# Patient Record
Sex: Female | Born: 1950 | Race: Black or African American | Hispanic: No | Marital: Married | Smoking: Former smoker
Health system: Southern US, Community
[De-identification: ages and names within clinical notes are randomized; demographics above are authoritative.]

## PROBLEM LIST (undated history)

## (undated) DIAGNOSIS — R609 Edema, unspecified: Secondary | ICD-10-CM

## (undated) DIAGNOSIS — M1990 Unspecified osteoarthritis, unspecified site: Secondary | ICD-10-CM

## (undated) DIAGNOSIS — M75101 Unspecified rotator cuff tear or rupture of right shoulder, not specified as traumatic: Secondary | ICD-10-CM

## (undated) DIAGNOSIS — J40 Bronchitis, not specified as acute or chronic: Secondary | ICD-10-CM

## (undated) DIAGNOSIS — E079 Disorder of thyroid, unspecified: Secondary | ICD-10-CM

## (undated) DIAGNOSIS — G4700 Insomnia, unspecified: Secondary | ICD-10-CM

## (undated) HISTORY — PX: BUNIONECTOMY: SHX129

## (undated) HISTORY — PX: COLONOSCOPY: SHX174

## (undated) HISTORY — DX: Bronchitis, not specified as acute or chronic: J40

## (undated) HISTORY — DX: Edema, unspecified: R60.9

## (undated) HISTORY — DX: Unspecified osteoarthritis, unspecified site: M19.90

## (undated) HISTORY — PX: CHOLECYSTECTOMY: SHX55

## (undated) HISTORY — PX: OTHER SURGICAL HISTORY: SHX169

## (undated) HISTORY — PX: ABDOMINAL HYSTERECTOMY: SHX81

## (undated) HISTORY — DX: Unspecified rotator cuff tear or rupture of right shoulder, not specified as traumatic: M75.101

## (undated) HISTORY — DX: Insomnia, unspecified: G47.00

---

## 1999-05-05 ENCOUNTER — Encounter: Payer: Self-pay | Admitting: Internal Medicine

## 1999-05-05 ENCOUNTER — Encounter: Admission: RE | Admit: 1999-05-05 | Discharge: 1999-05-05 | Payer: Self-pay | Admitting: Internal Medicine

## 2000-05-27 ENCOUNTER — Encounter: Admission: RE | Admit: 2000-05-27 | Discharge: 2000-05-27 | Payer: Self-pay | Admitting: Occupational Medicine

## 2000-05-27 ENCOUNTER — Encounter: Payer: Self-pay | Admitting: Occupational Medicine

## 2000-12-10 ENCOUNTER — Observation Stay (HOSPITAL_COMMUNITY): Admission: EM | Admit: 2000-12-10 | Discharge: 2000-12-11 | Payer: Self-pay | Admitting: Internal Medicine

## 2000-12-10 ENCOUNTER — Encounter: Payer: Self-pay | Admitting: Internal Medicine

## 2000-12-11 ENCOUNTER — Encounter: Payer: Self-pay | Admitting: Internal Medicine

## 2001-05-24 ENCOUNTER — Observation Stay (HOSPITAL_COMMUNITY): Admission: RE | Admit: 2001-05-24 | Discharge: 2001-05-25 | Payer: Self-pay | Admitting: General Surgery

## 2001-10-13 ENCOUNTER — Encounter: Payer: Self-pay | Admitting: Internal Medicine

## 2001-10-13 ENCOUNTER — Encounter: Admission: RE | Admit: 2001-10-13 | Discharge: 2001-10-13 | Payer: Self-pay | Admitting: Internal Medicine

## 2004-11-18 ENCOUNTER — Encounter: Admission: RE | Admit: 2004-11-18 | Discharge: 2004-11-18 | Payer: Self-pay | Admitting: Occupational Medicine

## 2004-12-24 ENCOUNTER — Ambulatory Visit: Payer: Self-pay | Admitting: Internal Medicine

## 2005-01-08 ENCOUNTER — Ambulatory Visit: Payer: Self-pay | Admitting: Internal Medicine

## 2005-10-21 ENCOUNTER — Encounter: Admission: RE | Admit: 2005-10-21 | Discharge: 2005-10-21 | Payer: Self-pay | Admitting: Orthopedic Surgery

## 2006-01-13 ENCOUNTER — Encounter: Admission: RE | Admit: 2006-01-13 | Discharge: 2006-01-13 | Payer: Self-pay | Admitting: Occupational Medicine

## 2009-04-11 ENCOUNTER — Encounter: Admission: RE | Admit: 2009-04-11 | Discharge: 2009-04-11 | Payer: Self-pay | Admitting: Internal Medicine

## 2010-03-04 ENCOUNTER — Ambulatory Visit: Payer: Self-pay | Admitting: Hematology & Oncology

## 2010-03-26 LAB — CBC WITH DIFFERENTIAL (CANCER CENTER ONLY)
BASO#: 0 10*3/uL (ref 0.0–0.2)
BASO%: 0.5 % (ref 0.0–2.0)
HCT: 40.4 % (ref 34.8–46.6)
LYMPH#: 2.6 10*3/uL (ref 0.9–3.3)
MONO#: 0.4 10*3/uL (ref 0.1–0.9)
NEUT#: 4.3 10*3/uL (ref 1.5–6.5)
Platelets: 525 10*3/uL — ABNORMAL HIGH (ref 145–400)
RDW: 11.5 % (ref 10.5–14.6)

## 2010-03-26 LAB — RETICULOCYTES (CHCC)
ABS Retic: 37 10*3/uL (ref 19.0–186.0)
RBC.: 4.62 MIL/uL (ref 3.87–5.11)
Retic Ct Pct: 0.8 % (ref 0.4–3.1)

## 2010-03-26 LAB — CHCC SATELLITE - SMEAR

## 2010-03-26 LAB — TECHNOLOGIST REVIEW CHCC SATELLITE

## 2010-04-02 LAB — JAK2 GENOTYPR: JAK2 GenotypR: NOT DETECTED

## 2010-04-25 ENCOUNTER — Encounter: Admission: RE | Admit: 2010-04-25 | Discharge: 2010-04-25 | Payer: Self-pay | Admitting: Internal Medicine

## 2010-07-04 ENCOUNTER — Ambulatory Visit: Payer: Self-pay | Admitting: Hematology & Oncology

## 2014-04-05 DIAGNOSIS — I10 Essential (primary) hypertension: Secondary | ICD-10-CM | POA: Diagnosis not present

## 2014-04-05 DIAGNOSIS — Z0000 Encounter for general adult medical examination without abnormal findings: Secondary | ICD-10-CM | POA: Diagnosis not present

## 2014-04-05 DIAGNOSIS — E782 Mixed hyperlipidemia: Secondary | ICD-10-CM | POA: Diagnosis not present

## 2014-04-11 ENCOUNTER — Emergency Department (HOSPITAL_COMMUNITY)
Admission: EM | Admit: 2014-04-11 | Discharge: 2014-04-11 | Disposition: A | Discharge status: Home / Self Care | Payer: Medicare Other | Attending: Emergency Medicine | Admitting: Emergency Medicine

## 2014-04-11 ENCOUNTER — Encounter (HOSPITAL_COMMUNITY): Payer: Self-pay | Admitting: Emergency Medicine

## 2014-04-11 DIAGNOSIS — E079 Disorder of thyroid, unspecified: Secondary | ICD-10-CM | POA: Insufficient documentation

## 2014-04-11 DIAGNOSIS — R7989 Other specified abnormal findings of blood chemistry: Secondary | ICD-10-CM | POA: Insufficient documentation

## 2014-04-11 DIAGNOSIS — R899 Unspecified abnormal finding in specimens from other organs, systems and tissues: Secondary | ICD-10-CM

## 2014-04-11 DIAGNOSIS — Z7982 Long term (current) use of aspirin: Secondary | ICD-10-CM | POA: Insufficient documentation

## 2014-04-11 DIAGNOSIS — R0602 Shortness of breath: Secondary | ICD-10-CM | POA: Diagnosis not present

## 2014-04-11 DIAGNOSIS — Z79899 Other long term (current) drug therapy: Secondary | ICD-10-CM | POA: Diagnosis not present

## 2014-04-11 DIAGNOSIS — R799 Abnormal finding of blood chemistry, unspecified: Secondary | ICD-10-CM | POA: Diagnosis not present

## 2014-04-11 HISTORY — DX: Disorder of thyroid, unspecified: E07.9

## 2014-04-11 LAB — BASIC METABOLIC PANEL
Anion gap: 10 (ref 5–15)
BUN: 7 mg/dL (ref 6–23)
CALCIUM: 9.8 mg/dL (ref 8.4–10.5)
CHLORIDE: 101 meq/L (ref 96–112)
CO2: 30 mEq/L (ref 19–32)
Creatinine, Ser: 0.87 mg/dL (ref 0.50–1.10)
GFR calc non Af Amer: 68 mL/min — ABNORMAL LOW (ref >90)
GFR, EST AFRICAN AMERICAN: 79 mL/min — AB (ref >90)
GLUCOSE: 88 mg/dL (ref 70–99)
POTASSIUM: 3.8 meq/L (ref 3.7–5.3)
SODIUM: 141 meq/L (ref 137–147)

## 2014-04-11 LAB — I-STAT TROPONIN, ED: Troponin i, poc: 0 ng/mL (ref 0.00–0.08)

## 2014-04-11 LAB — I-STAT CHEM 8, ED
BUN: 5 mg/dL — AB (ref 6–23)
CALCIUM ION: 1.31 mmol/L — AB (ref 1.13–1.30)
CREATININE: 0.9 mg/dL (ref 0.50–1.10)
Chloride: 99 mEq/L (ref 96–112)
GLUCOSE: 92 mg/dL (ref 70–99)
HCT: 41 % (ref 36.0–46.0)
HEMOGLOBIN: 13.9 g/dL (ref 12.0–15.0)
Potassium: 3.7 mEq/L (ref 3.7–5.3)
Sodium: 140 mEq/L (ref 137–147)
TCO2: 30 mmol/L (ref 0–100)

## 2014-04-11 NOTE — ED Notes (Signed)
Pt sent here by her doctor for abnormal labs. High K. 7.7. Pt denies any symptoms and has no complaints.

## 2014-04-11 NOTE — ED Provider Notes (Signed)
Medical screening examination/treatment/procedure(s) were performed by non-physician practitioner and as supervising physician I was immediately available for consultation/collaboration.   EKG Interpretation   Date/Time:  Wednesday April 11 2014 09:24:39 EDT Ventricular Rate:  70 PR Interval:  170 QRS Duration: 84 QT Interval:  378 QTC Calculation: 408 R Axis:   73 Text Interpretation:  Normal sinus rhythm Normal ECG No previous ECGs  available Confirmed by Christy Gentles  MD, Luke Rigsbee ([id number]) on 04/11/2014 9:26:45  AM        Sharyon Cable, MD 04/11/14 1353

## 2014-04-11 NOTE — Discharge Instructions (Signed)
Hyperkalemia Hyperkalemia is when you have too much potassium in your blood. This can be a life-threatening condition. Potassium is normally removed (excreted) from the body by the kidneys. CAUSES  The potassium level in your body can become too high for the following reasons:  You take in too much potassium. You can do this by:  Using salt substitutes. They contain large amounts of potassium.  Taking potassium supplements from your caregiver. The dose may be too high for you.  Eating foods or taking nutritional products with potassium.  You excrete too little potassium. This can happen if:  Your kidneys are not functioning properly. Kidney (renal) disease is a very common cause of hyperkalemia.  You are taking medicines that lower your excretion of potassium, such as certain diuretic medicines.  You have an adrenal gland disease called Addison's disease.  You have a urinary tract obstruction, such as kidney stones.  You are on treatment to mechanically clean your blood (dialysis) and you skip a treatment.  You release a high amount of potassium from your cells into your blood. You may have a condition that causes potassium to move from your cells to your bloodstream. This can happen with:  Injury to muscles or other tissues. Most potassium is stored in the muscles.  Severe burns or infections.  Acidic blood plasma (acidosis). Acidosis can result from many diseases, such as uncontrolled diabetes. SYMPTOMS  Usually, there are no symptoms unless the potassium is dangerously high or has risen very quickly. Symptoms may include:  Irregular or very slow heartbeat.  Feeling sick to your stomach (nauseous).  Tiredness (fatigue).  Nerve problems such as tingling of the skin, numbness of the hands or feet, weakness, or paralysis. DIAGNOSIS  A simple blood test can measure the amount of potassium in your body. An electrocardiogram test of the heart can also help make the diagnosis.  The heart may beat dangerously fast or slow down and stop beating with severe hyperkalemia.  TREATMENT  Treatment depends on how bad the condition is and on the underlying cause.  If the hyperkalemia is an emergency (causing heart problems or paralysis), many different medicines can be used alone or together to lower the potassium level briefly. This may include an insulin injection even if you are not diabetic. Emergency dialysis may be needed to remove potassium from the body.  If the hyperkalemia is less severe or dangerous, the underlying cause is treated. This can include taking medicines if needed. Your prescription medicines may be changed. You may also need to take a medicine to help your body get rid of potassium. You may need to eat a diet low in potassium. HOME CARE INSTRUCTIONS   Take medicines and supplements as directed by your caregiver.  Do not take any over-the-counter medicines, supplements, natural products, herbs, or vitamins without reviewing them with your caregiver. Certain supplements and natural food products can have high amounts of potassium. Other products (such as ibuprofen) can damage weak kidneys and raise your potassium.  You may be asked to do repeat lab tests. Be sure to follow these directions.  If you have kidney disease, you may need to follow a low potassium diet. SEEK MEDICAL CARE IF:   You notice an irregular or very slow heartbeat.  You feel lightheaded.  You develop weakness that is unusual for you. SEEK IMMEDIATE MEDICAL CARE IF:   You have shortness of breath.  You have chest discomfort.  You pass out (faint). MAKE SURE YOU:   Understand   these instructions.  Will watch your condition.  Will get help right away if you are not doing well or get worse. Document Released: 05/29/2002 Document Revised: 08/31/2011 Document Reviewed: 09/13/2013 [employer] Patient Information 2015 [employer]. This information is not intended to replace  advice given to you by your health care provider. Make sure you discuss any questions you have with your health care provider.  

## 2014-04-11 NOTE — ED Notes (Signed)
Pt reports no complaints. Went to pcp yesterday for checkup and was called today due to lab results of K 7.7. No distress noted at this time.

## 2014-04-11 NOTE — ED Provider Notes (Signed)
CSN: [id number]     Arrival date & time 04/11/14  0905 History   First MD Initiated Contact with Patient 04/11/14 [phone]     Chief Complaint  Patient presents with  . abnormal labs      (Consider location/radiation/quality/duration/timing/severity/associated sxs/prior Treatment) HPI Comments: Patient presents to the emergency department with chief complaints of abnormal lab. She states that she was caught by her primary care physician this morning, and told to come to the emergency department for evaluation of hyperkalemia. She states that her potassium level was 7.7. She had blood drawn approximately one week ago. She denies any associated chest pain. She states that she has had some exertional shortness of breath for the past several months. She states this is mild, and does not interfere with her daily life. She has not taken anything to alleviate her symptoms. She has no cardiac history, or history of PE or DVT.  The history is provided by the patient. No language interpreter was used.    Past Medical History  Diagnosis Date  . Thyroid disease    Past Surgical History  Procedure Laterality Date  . Abdominal hysterectomy    . Cesarean section     History reviewed. No pertinent family history. History  Substance Use Topics  . Smoking status: Never Smoker   . Smokeless tobacco: Not on file  . Alcohol Use: Yes     Comment: occ   OB History   Grav Para Term Preterm Abortions TAB SAB Ect Mult Living                 Review of Systems  Constitutional: Negative for fever and chills.  Respiratory: Positive for shortness of breath.   Cardiovascular: Negative for chest pain.  Gastrointestinal: Negative for nausea, vomiting, diarrhea and constipation.  Genitourinary: Negative for dysuria.  All other systems reviewed and are negative.     Allergies  Review of patient's allergies indicates no known allergies.  Home Medications   Prior to Admission medications   Medication  Sig Start Date End Date Taking? Authorizing Provider  aspirin EC 81 MG tablet Take 81 mg by mouth daily.   Yes Historical Provider, MD  estrogens, conjugated, (PREMARIN) 1.25 MG tablet Take 1.25 mg by mouth daily.   Yes Historical Provider, MD  levothyroxine (SYNTHROID, LEVOTHROID) 100 MCG tablet Take 100 mcg by mouth daily before breakfast.   Yes Historical Provider, MD  Multiple Vitamins-Minerals (MULTIVITAMIN WITH MINERALS) tablet Take 1 tablet by mouth daily.   Yes Historical Provider, MD  zolpidem (AMBIEN) 10 MG tablet Take 10 mg by mouth at bedtime.   Yes Historical Provider, MD   BP 146/67  Pulse 72  Temp(Src) 97.8 F (36.6 C) (Oral)  Resp 20  SpO2 100% Physical Exam  Nursing note and vitals reviewed. Constitutional: She is oriented to person, place, and time. She appears well-developed and well-nourished.  HENT:  Head: Normocephalic and atraumatic.  Eyes: Conjunctivae and EOM are normal. Pupils are equal, round, and reactive to light.  Neck: Normal range of motion. Neck supple.  Cardiovascular: Normal rate and regular rhythm.  Exam reveals no gallop and no friction rub.   No murmur heard. Pulmonary/Chest: Effort normal and breath sounds normal. No respiratory distress. She has no wheezes. She has no rales. She exhibits no tenderness.  Clear to auscultation bilaterally  Abdominal: Soft. She exhibits no distension and no mass. There is no tenderness. There is no rebound and no guarding.  Musculoskeletal: Normal range of motion. She  exhibits no edema and no tenderness.  Neurological: She is alert and oriented to person, place, and time.  Skin: Skin is warm and dry.  Psychiatric: She has a normal mood and affect. Her behavior is normal. Judgment and thought content normal.    ED Course  Procedures (including critical care time) Results for orders placed during the hospital encounter of 40/34/74  BASIC METABOLIC PANEL      Result Value Ref Range   Sodium 141  137 - 147 mEq/L    Potassium 3.8  3.7 - 5.3 mEq/L   Chloride 101  96 - 112 mEq/L   CO2 30  19 - 32 mEq/L   Glucose, Bld 88  70 - 99 mg/dL   BUN 7  6 - 23 mg/dL   Creatinine, Ser 0.87  0.50 - 1.10 mg/dL   Calcium 9.8  8.4 - 10.5 mg/dL   GFR calc non Af Amer 68 (*) >90 mL/min   GFR calc Af Amer 79 (*) >90 mL/min   Anion gap 10  5 - 15  I-STAT CHEM 8, ED      Result Value Ref Range   Sodium 140  137 - 147 mEq/L   Potassium 3.7  3.7 - 5.3 mEq/L   Chloride 99  96 - 112 mEq/L   BUN 5 (*) 6 - 23 mg/dL   Creatinine, Ser 0.90  0.50 - 1.10 mg/dL   Glucose, Bld 92  70 - 99 mg/dL   Calcium, Ion 1.31 (*) 1.13 - 1.30 mmol/L   TCO2 30  0 - 100 mmol/L   Hemoglobin 13.9  12.0 - 15.0 g/dL   HCT 41.0  36.0 - 46.0 %  I-STAT TROPOININ, ED      Result Value Ref Range   Troponin i, poc 0.00  0.00 - 0.08 ng/mL   Comment 3                EKG Interpretation   Date/Time:  Wednesday April 11 2014 09:24:39 EDT Ventricular Rate:  70 PR Interval:  170 QRS Duration: 84 QT Interval:  378 QTC Calculation: 408 R Axis:   73 Text Interpretation:  Normal sinus rhythm Normal ECG No previous ECGs  available Confirmed by [place] ([id number]) on 04/11/2014 9:26:45  AM      MDM   Final diagnoses:  Abnormal laboratory test result    Patient with reported lab value potassium of 7.7. Suspect hemolysis, given the patient is symptom-free, and her EKG is normal, no peaked T waves. Will check basic labs, and we'll recess.  Today, potassium is 3.7.  Patient feels well.  Recommend PCP follow-up.  No chest pain. Very mild intermittent SOB for months.  No symptoms here.  Doubt PE, no hx of such, no hypoxia.  Normal EKG and troponin, no cardiac history, no chest pain.  Appropriate for follow-up with PCP.  Patient discussed with Dr. Christy Gentles.    Montine Circle, PA-C 04/11/14 1142

## 2014-04-26 DIAGNOSIS — M5442 Lumbago with sciatica, left side: Secondary | ICD-10-CM | POA: Diagnosis not present

## 2014-04-26 DIAGNOSIS — M47816 Spondylosis without myelopathy or radiculopathy, lumbar region: Secondary | ICD-10-CM | POA: Diagnosis not present

## 2014-04-26 DIAGNOSIS — M9903 Segmental and somatic dysfunction of lumbar region: Secondary | ICD-10-CM | POA: Diagnosis not present

## 2014-04-26 DIAGNOSIS — M5136 Other intervertebral disc degeneration, lumbar region: Secondary | ICD-10-CM | POA: Diagnosis not present

## 2014-05-07 DIAGNOSIS — E875 Hyperkalemia: Secondary | ICD-10-CM | POA: Diagnosis not present

## 2014-05-24 DIAGNOSIS — M5136 Other intervertebral disc degeneration, lumbar region: Secondary | ICD-10-CM | POA: Diagnosis not present

## 2014-05-24 DIAGNOSIS — M47816 Spondylosis without myelopathy or radiculopathy, lumbar region: Secondary | ICD-10-CM | POA: Diagnosis not present

## 2014-05-24 DIAGNOSIS — M5442 Lumbago with sciatica, left side: Secondary | ICD-10-CM | POA: Diagnosis not present

## 2014-05-24 DIAGNOSIS — M9903 Segmental and somatic dysfunction of lumbar region: Secondary | ICD-10-CM | POA: Diagnosis not present

## 2014-06-27 DIAGNOSIS — M47816 Spondylosis without myelopathy or radiculopathy, lumbar region: Secondary | ICD-10-CM | POA: Diagnosis not present

## 2014-06-27 DIAGNOSIS — M5136 Other intervertebral disc degeneration, lumbar region: Secondary | ICD-10-CM | POA: Diagnosis not present

## 2014-06-27 DIAGNOSIS — M5442 Lumbago with sciatica, left side: Secondary | ICD-10-CM | POA: Diagnosis not present

## 2014-06-27 DIAGNOSIS — M9903 Segmental and somatic dysfunction of lumbar region: Secondary | ICD-10-CM | POA: Diagnosis not present

## 2014-07-12 DIAGNOSIS — E559 Vitamin D deficiency, unspecified: Secondary | ICD-10-CM | POA: Diagnosis not present

## 2014-07-12 DIAGNOSIS — G4700 Insomnia, unspecified: Secondary | ICD-10-CM | POA: Diagnosis not present

## 2014-07-12 DIAGNOSIS — E039 Hypothyroidism, unspecified: Secondary | ICD-10-CM | POA: Diagnosis not present

## 2014-07-12 DIAGNOSIS — Z23 Encounter for immunization: Secondary | ICD-10-CM | POA: Diagnosis not present

## 2014-07-12 DIAGNOSIS — N951 Menopausal and female climacteric states: Secondary | ICD-10-CM | POA: Diagnosis not present

## 2014-07-26 DIAGNOSIS — M5442 Lumbago with sciatica, left side: Secondary | ICD-10-CM | POA: Diagnosis not present

## 2014-07-26 DIAGNOSIS — M5136 Other intervertebral disc degeneration, lumbar region: Secondary | ICD-10-CM | POA: Diagnosis not present

## 2014-07-26 DIAGNOSIS — M47816 Spondylosis without myelopathy or radiculopathy, lumbar region: Secondary | ICD-10-CM | POA: Diagnosis not present

## 2014-07-26 DIAGNOSIS — M9903 Segmental and somatic dysfunction of lumbar region: Secondary | ICD-10-CM | POA: Diagnosis not present

## 2014-08-30 DIAGNOSIS — M9903 Segmental and somatic dysfunction of lumbar region: Secondary | ICD-10-CM | POA: Diagnosis not present

## 2014-08-30 DIAGNOSIS — M5136 Other intervertebral disc degeneration, lumbar region: Secondary | ICD-10-CM | POA: Diagnosis not present

## 2014-08-30 DIAGNOSIS — M47816 Spondylosis without myelopathy or radiculopathy, lumbar region: Secondary | ICD-10-CM | POA: Diagnosis not present

## 2014-08-30 DIAGNOSIS — M5442 Lumbago with sciatica, left side: Secondary | ICD-10-CM | POA: Diagnosis not present

## 2014-09-24 DIAGNOSIS — M5442 Lumbago with sciatica, left side: Secondary | ICD-10-CM | POA: Diagnosis not present

## 2014-09-24 DIAGNOSIS — M5136 Other intervertebral disc degeneration, lumbar region: Secondary | ICD-10-CM | POA: Diagnosis not present

## 2014-09-24 DIAGNOSIS — M47816 Spondylosis without myelopathy or radiculopathy, lumbar region: Secondary | ICD-10-CM | POA: Diagnosis not present

## 2014-09-24 DIAGNOSIS — M9903 Segmental and somatic dysfunction of lumbar region: Secondary | ICD-10-CM | POA: Diagnosis not present

## 2014-10-29 DIAGNOSIS — M9903 Segmental and somatic dysfunction of lumbar region: Secondary | ICD-10-CM | POA: Diagnosis not present

## 2014-10-29 DIAGNOSIS — M5136 Other intervertebral disc degeneration, lumbar region: Secondary | ICD-10-CM | POA: Diagnosis not present

## 2014-10-29 DIAGNOSIS — M5442 Lumbago with sciatica, left side: Secondary | ICD-10-CM | POA: Diagnosis not present

## 2014-10-29 DIAGNOSIS — M47816 Spondylosis without myelopathy or radiculopathy, lumbar region: Secondary | ICD-10-CM | POA: Diagnosis not present

## 2014-10-30 DIAGNOSIS — M9903 Segmental and somatic dysfunction of lumbar region: Secondary | ICD-10-CM | POA: Diagnosis not present

## 2014-10-30 DIAGNOSIS — M5442 Lumbago with sciatica, left side: Secondary | ICD-10-CM | POA: Diagnosis not present

## 2014-10-30 DIAGNOSIS — M47816 Spondylosis without myelopathy or radiculopathy, lumbar region: Secondary | ICD-10-CM | POA: Diagnosis not present

## 2014-10-30 DIAGNOSIS — M5136 Other intervertebral disc degeneration, lumbar region: Secondary | ICD-10-CM | POA: Diagnosis not present

## 2014-11-01 DIAGNOSIS — M5136 Other intervertebral disc degeneration, lumbar region: Secondary | ICD-10-CM | POA: Diagnosis not present

## 2014-11-01 DIAGNOSIS — M9903 Segmental and somatic dysfunction of lumbar region: Secondary | ICD-10-CM | POA: Diagnosis not present

## 2014-11-01 DIAGNOSIS — M47816 Spondylosis without myelopathy or radiculopathy, lumbar region: Secondary | ICD-10-CM | POA: Diagnosis not present

## 2014-11-01 DIAGNOSIS — M5442 Lumbago with sciatica, left side: Secondary | ICD-10-CM | POA: Diagnosis not present

## 2014-11-02 DIAGNOSIS — E039 Hypothyroidism, unspecified: Secondary | ICD-10-CM | POA: Diagnosis not present

## 2014-11-02 DIAGNOSIS — Z79899 Other long term (current) drug therapy: Secondary | ICD-10-CM | POA: Diagnosis not present

## 2014-11-03 DIAGNOSIS — M25562 Pain in left knee: Secondary | ICD-10-CM | POA: Diagnosis not present

## 2014-11-03 DIAGNOSIS — G4700 Insomnia, unspecified: Secondary | ICD-10-CM | POA: Diagnosis not present

## 2014-11-03 DIAGNOSIS — J301 Allergic rhinitis due to pollen: Secondary | ICD-10-CM | POA: Diagnosis not present

## 2014-11-03 DIAGNOSIS — E039 Hypothyroidism, unspecified: Secondary | ICD-10-CM | POA: Diagnosis not present

## 2014-11-05 DIAGNOSIS — M47816 Spondylosis without myelopathy or radiculopathy, lumbar region: Secondary | ICD-10-CM | POA: Diagnosis not present

## 2014-11-05 DIAGNOSIS — M9903 Segmental and somatic dysfunction of lumbar region: Secondary | ICD-10-CM | POA: Diagnosis not present

## 2014-11-05 DIAGNOSIS — M5136 Other intervertebral disc degeneration, lumbar region: Secondary | ICD-10-CM | POA: Diagnosis not present

## 2014-11-05 DIAGNOSIS — M5442 Lumbago with sciatica, left side: Secondary | ICD-10-CM | POA: Diagnosis not present

## 2014-11-08 DIAGNOSIS — M5442 Lumbago with sciatica, left side: Secondary | ICD-10-CM | POA: Diagnosis not present

## 2014-11-08 DIAGNOSIS — M9903 Segmental and somatic dysfunction of lumbar region: Secondary | ICD-10-CM | POA: Diagnosis not present

## 2014-11-08 DIAGNOSIS — M5136 Other intervertebral disc degeneration, lumbar region: Secondary | ICD-10-CM | POA: Diagnosis not present

## 2014-11-08 DIAGNOSIS — M47816 Spondylosis without myelopathy or radiculopathy, lumbar region: Secondary | ICD-10-CM | POA: Diagnosis not present

## 2014-11-24 ENCOUNTER — Emergency Department (HOSPITAL_COMMUNITY)
Admission: EM | Admit: 2014-11-24 | Discharge: 2014-11-24 | Disposition: A | Discharge status: Home / Self Care | Payer: No Typology Code available for payment source | Attending: Emergency Medicine | Admitting: Emergency Medicine

## 2014-11-24 ENCOUNTER — Encounter (HOSPITAL_COMMUNITY): Payer: Self-pay | Admitting: *Deleted

## 2014-11-24 DIAGNOSIS — Y9389 Activity, other specified: Secondary | ICD-10-CM | POA: Insufficient documentation

## 2014-11-24 DIAGNOSIS — Y998 Other external cause status: Secondary | ICD-10-CM | POA: Insufficient documentation

## 2014-11-24 DIAGNOSIS — Y92410 Unspecified street and highway as the place of occurrence of the external cause: Secondary | ICD-10-CM | POA: Diagnosis not present

## 2014-11-24 DIAGNOSIS — E079 Disorder of thyroid, unspecified: Secondary | ICD-10-CM | POA: Diagnosis not present

## 2014-11-24 DIAGNOSIS — S199XXA Unspecified injury of neck, initial encounter: Secondary | ICD-10-CM | POA: Diagnosis present

## 2014-11-24 DIAGNOSIS — Z79899 Other long term (current) drug therapy: Secondary | ICD-10-CM | POA: Diagnosis not present

## 2014-11-24 DIAGNOSIS — S161XXA Strain of muscle, fascia and tendon at neck level, initial encounter: Secondary | ICD-10-CM | POA: Diagnosis not present

## 2014-11-24 DIAGNOSIS — Z7982 Long term (current) use of aspirin: Secondary | ICD-10-CM | POA: Insufficient documentation

## 2014-11-24 MED ORDER — METHOCARBAMOL 500 MG PO TABS: 500.0000 mg | ORAL_TABLET | Freq: Four times a day (QID) | ORAL | Status: DC

## 2014-11-24 NOTE — ED Notes (Signed)
Declined W/C at D/C and was escorted to lobby by RN. 
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
## 2014-11-29 DIAGNOSIS — M62838 Other muscle spasm: Secondary | ICD-10-CM | POA: Diagnosis not present

## 2014-11-29 DIAGNOSIS — M62830 Muscle spasm of back: Secondary | ICD-10-CM | POA: Diagnosis not present

## 2014-12-28 DIAGNOSIS — E039 Hypothyroidism, unspecified: Secondary | ICD-10-CM | POA: Diagnosis not present

## 2015-01-07 DIAGNOSIS — I10 Essential (primary) hypertension: Secondary | ICD-10-CM | POA: Diagnosis not present

## 2015-01-07 DIAGNOSIS — M542 Cervicalgia: Secondary | ICD-10-CM | POA: Diagnosis not present

## 2015-01-25 DIAGNOSIS — S161XXA Strain of muscle, fascia and tendon at neck level, initial encounter: Secondary | ICD-10-CM | POA: Diagnosis not present

## 2015-01-25 DIAGNOSIS — M542 Cervicalgia: Secondary | ICD-10-CM | POA: Diagnosis not present

## 2015-04-30 DIAGNOSIS — Z0000 Encounter for general adult medical examination without abnormal findings: Secondary | ICD-10-CM | POA: Diagnosis not present

## 2015-04-30 DIAGNOSIS — E039 Hypothyroidism, unspecified: Secondary | ICD-10-CM | POA: Diagnosis not present

## 2015-04-30 DIAGNOSIS — M542 Cervicalgia: Secondary | ICD-10-CM | POA: Diagnosis not present

## 2015-04-30 DIAGNOSIS — I10 Essential (primary) hypertension: Secondary | ICD-10-CM | POA: Diagnosis not present

## 2015-04-30 DIAGNOSIS — Z79899 Other long term (current) drug therapy: Secondary | ICD-10-CM | POA: Diagnosis not present

## 2015-05-01 DIAGNOSIS — E039 Hypothyroidism, unspecified: Secondary | ICD-10-CM | POA: Diagnosis not present

## 2015-05-01 DIAGNOSIS — E559 Vitamin D deficiency, unspecified: Secondary | ICD-10-CM | POA: Diagnosis not present

## 2015-05-01 DIAGNOSIS — R7309 Other abnormal glucose: Secondary | ICD-10-CM | POA: Diagnosis not present

## 2015-05-01 DIAGNOSIS — Z0000 Encounter for general adult medical examination without abnormal findings: Secondary | ICD-10-CM | POA: Diagnosis not present

## 2015-10-08 DIAGNOSIS — M546 Pain in thoracic spine: Secondary | ICD-10-CM | POA: Diagnosis not present

## 2015-10-08 DIAGNOSIS — M9901 Segmental and somatic dysfunction of cervical region: Secondary | ICD-10-CM | POA: Diagnosis not present

## 2015-10-08 DIAGNOSIS — M542 Cervicalgia: Secondary | ICD-10-CM | POA: Diagnosis not present

## 2015-10-08 DIAGNOSIS — M5030 Other cervical disc degeneration, unspecified cervical region: Secondary | ICD-10-CM | POA: Diagnosis not present

## 2015-10-15 DIAGNOSIS — M546 Pain in thoracic spine: Secondary | ICD-10-CM | POA: Diagnosis not present

## 2015-10-15 DIAGNOSIS — M5030 Other cervical disc degeneration, unspecified cervical region: Secondary | ICD-10-CM | POA: Diagnosis not present

## 2015-10-15 DIAGNOSIS — M542 Cervicalgia: Secondary | ICD-10-CM | POA: Diagnosis not present

## 2015-10-15 DIAGNOSIS — M9901 Segmental and somatic dysfunction of cervical region: Secondary | ICD-10-CM | POA: Diagnosis not present

## 2015-10-22 DIAGNOSIS — M542 Cervicalgia: Secondary | ICD-10-CM | POA: Diagnosis not present

## 2015-10-22 DIAGNOSIS — M5030 Other cervical disc degeneration, unspecified cervical region: Secondary | ICD-10-CM | POA: Diagnosis not present

## 2015-10-22 DIAGNOSIS — M9901 Segmental and somatic dysfunction of cervical region: Secondary | ICD-10-CM | POA: Diagnosis not present

## 2015-10-22 DIAGNOSIS — M546 Pain in thoracic spine: Secondary | ICD-10-CM | POA: Diagnosis not present

## 2015-11-01 DIAGNOSIS — H40013 Open angle with borderline findings, low risk, bilateral: Secondary | ICD-10-CM | POA: Diagnosis not present

## 2015-11-01 DIAGNOSIS — H2513 Age-related nuclear cataract, bilateral: Secondary | ICD-10-CM | POA: Diagnosis not present

## 2015-11-26 DIAGNOSIS — Z79899 Other long term (current) drug therapy: Secondary | ICD-10-CM | POA: Diagnosis not present

## 2015-11-26 DIAGNOSIS — Z6827 Body mass index (BMI) 27.0-27.9, adult: Secondary | ICD-10-CM | POA: Diagnosis not present

## 2015-11-26 DIAGNOSIS — E039 Hypothyroidism, unspecified: Secondary | ICD-10-CM | POA: Diagnosis not present

## 2015-11-26 DIAGNOSIS — N951 Menopausal and female climacteric states: Secondary | ICD-10-CM | POA: Diagnosis not present

## 2015-11-26 DIAGNOSIS — I10 Essential (primary) hypertension: Secondary | ICD-10-CM | POA: Diagnosis not present

## 2015-11-26 DIAGNOSIS — Z79890 Hormone replacement therapy (postmenopausal): Secondary | ICD-10-CM | POA: Diagnosis not present

## 2015-11-26 DIAGNOSIS — R7309 Other abnormal glucose: Secondary | ICD-10-CM | POA: Diagnosis not present

## 2015-12-03 DIAGNOSIS — M5030 Other cervical disc degeneration, unspecified cervical region: Secondary | ICD-10-CM | POA: Diagnosis not present

## 2015-12-03 DIAGNOSIS — M542 Cervicalgia: Secondary | ICD-10-CM | POA: Diagnosis not present

## 2015-12-03 DIAGNOSIS — M546 Pain in thoracic spine: Secondary | ICD-10-CM | POA: Diagnosis not present

## 2015-12-03 DIAGNOSIS — M9901 Segmental and somatic dysfunction of cervical region: Secondary | ICD-10-CM | POA: Diagnosis not present

## 2016-01-20 DIAGNOSIS — E039 Hypothyroidism, unspecified: Secondary | ICD-10-CM | POA: Diagnosis not present

## 2016-01-21 DIAGNOSIS — M542 Cervicalgia: Secondary | ICD-10-CM | POA: Diagnosis not present

## 2016-01-21 DIAGNOSIS — M5030 Other cervical disc degeneration, unspecified cervical region: Secondary | ICD-10-CM | POA: Diagnosis not present

## 2016-01-21 DIAGNOSIS — M9901 Segmental and somatic dysfunction of cervical region: Secondary | ICD-10-CM | POA: Diagnosis not present

## 2016-01-21 DIAGNOSIS — M546 Pain in thoracic spine: Secondary | ICD-10-CM | POA: Diagnosis not present

## 2016-04-16 DIAGNOSIS — M47814 Spondylosis without myelopathy or radiculopathy, thoracic region: Secondary | ICD-10-CM | POA: Diagnosis not present

## 2016-04-16 DIAGNOSIS — M5134 Other intervertebral disc degeneration, thoracic region: Secondary | ICD-10-CM | POA: Diagnosis not present

## 2016-04-16 DIAGNOSIS — M9902 Segmental and somatic dysfunction of thoracic region: Secondary | ICD-10-CM | POA: Diagnosis not present

## 2016-04-16 DIAGNOSIS — M546 Pain in thoracic spine: Secondary | ICD-10-CM | POA: Diagnosis not present

## 2016-04-30 DIAGNOSIS — E559 Vitamin D deficiency, unspecified: Secondary | ICD-10-CM | POA: Diagnosis not present

## 2016-04-30 DIAGNOSIS — Z0000 Encounter for general adult medical examination without abnormal findings: Secondary | ICD-10-CM | POA: Diagnosis not present

## 2016-04-30 DIAGNOSIS — I10 Essential (primary) hypertension: Secondary | ICD-10-CM | POA: Diagnosis not present

## 2016-04-30 DIAGNOSIS — R7309 Other abnormal glucose: Secondary | ICD-10-CM | POA: Diagnosis not present

## 2016-04-30 DIAGNOSIS — E039 Hypothyroidism, unspecified: Secondary | ICD-10-CM | POA: Diagnosis not present

## 2016-05-19 DIAGNOSIS — M5134 Other intervertebral disc degeneration, thoracic region: Secondary | ICD-10-CM | POA: Diagnosis not present

## 2016-05-19 DIAGNOSIS — M546 Pain in thoracic spine: Secondary | ICD-10-CM | POA: Diagnosis not present

## 2016-05-19 DIAGNOSIS — M9902 Segmental and somatic dysfunction of thoracic region: Secondary | ICD-10-CM | POA: Diagnosis not present

## 2016-05-19 DIAGNOSIS — M47814 Spondylosis without myelopathy or radiculopathy, thoracic region: Secondary | ICD-10-CM | POA: Diagnosis not present

## 2016-05-26 DIAGNOSIS — N951 Menopausal and female climacteric states: Secondary | ICD-10-CM | POA: Diagnosis not present

## 2016-05-26 DIAGNOSIS — I10 Essential (primary) hypertension: Secondary | ICD-10-CM | POA: Diagnosis not present

## 2016-05-26 DIAGNOSIS — H6123 Impacted cerumen, bilateral: Secondary | ICD-10-CM | POA: Diagnosis not present

## 2016-06-09 DIAGNOSIS — M5134 Other intervertebral disc degeneration, thoracic region: Secondary | ICD-10-CM | POA: Diagnosis not present

## 2016-06-09 DIAGNOSIS — M546 Pain in thoracic spine: Secondary | ICD-10-CM | POA: Diagnosis not present

## 2016-06-09 DIAGNOSIS — M9902 Segmental and somatic dysfunction of thoracic region: Secondary | ICD-10-CM | POA: Diagnosis not present

## 2016-06-09 DIAGNOSIS — M47814 Spondylosis without myelopathy or radiculopathy, thoracic region: Secondary | ICD-10-CM | POA: Diagnosis not present

## 2016-06-29 DIAGNOSIS — D473 Essential (hemorrhagic) thrombocythemia: Secondary | ICD-10-CM | POA: Diagnosis not present

## 2016-08-06 DIAGNOSIS — B308 Other viral conjunctivitis: Secondary | ICD-10-CM | POA: Diagnosis not present

## 2016-10-08 DIAGNOSIS — M9902 Segmental and somatic dysfunction of thoracic region: Secondary | ICD-10-CM | POA: Diagnosis not present

## 2016-10-08 DIAGNOSIS — M47814 Spondylosis without myelopathy or radiculopathy, thoracic region: Secondary | ICD-10-CM | POA: Diagnosis not present

## 2016-10-08 DIAGNOSIS — M5134 Other intervertebral disc degeneration, thoracic region: Secondary | ICD-10-CM | POA: Diagnosis not present

## 2016-10-08 DIAGNOSIS — M546 Pain in thoracic spine: Secondary | ICD-10-CM | POA: Diagnosis not present

## 2016-10-26 DIAGNOSIS — E785 Hyperlipidemia, unspecified: Secondary | ICD-10-CM | POA: Diagnosis not present

## 2016-10-26 DIAGNOSIS — E039 Hypothyroidism, unspecified: Secondary | ICD-10-CM | POA: Diagnosis not present

## 2016-10-26 DIAGNOSIS — R0602 Shortness of breath: Secondary | ICD-10-CM | POA: Diagnosis not present

## 2016-10-26 DIAGNOSIS — D473 Essential (hemorrhagic) thrombocythemia: Secondary | ICD-10-CM | POA: Diagnosis not present

## 2016-10-26 DIAGNOSIS — Z79899 Other long term (current) drug therapy: Secondary | ICD-10-CM | POA: Diagnosis not present

## 2016-10-26 DIAGNOSIS — N951 Menopausal and female climacteric states: Secondary | ICD-10-CM | POA: Diagnosis not present

## 2016-11-05 DIAGNOSIS — M47814 Spondylosis without myelopathy or radiculopathy, thoracic region: Secondary | ICD-10-CM | POA: Diagnosis not present

## 2016-11-05 DIAGNOSIS — M9902 Segmental and somatic dysfunction of thoracic region: Secondary | ICD-10-CM | POA: Diagnosis not present

## 2016-11-05 DIAGNOSIS — M546 Pain in thoracic spine: Secondary | ICD-10-CM | POA: Diagnosis not present

## 2016-11-05 DIAGNOSIS — M5134 Other intervertebral disc degeneration, thoracic region: Secondary | ICD-10-CM | POA: Diagnosis not present

## 2016-11-06 DIAGNOSIS — H2513 Age-related nuclear cataract, bilateral: Secondary | ICD-10-CM | POA: Diagnosis not present

## 2016-11-06 DIAGNOSIS — H40013 Open angle with borderline findings, low risk, bilateral: Secondary | ICD-10-CM | POA: Diagnosis not present

## 2016-11-06 DIAGNOSIS — H10413 Chronic giant papillary conjunctivitis, bilateral: Secondary | ICD-10-CM | POA: Diagnosis not present

## 2016-11-24 ENCOUNTER — Ambulatory Visit (HOSPITAL_BASED_OUTPATIENT_CLINIC_OR_DEPARTMENT_OTHER): Payer: Medicare Other | Admitting: Hematology & Oncology

## 2016-11-24 ENCOUNTER — Telehealth: Payer: Self-pay | Admitting: *Deleted

## 2016-11-24 ENCOUNTER — Other Ambulatory Visit (HOSPITAL_BASED_OUTPATIENT_CLINIC_OR_DEPARTMENT_OTHER): Payer: Medicare Other

## 2016-11-24 ENCOUNTER — Ambulatory Visit: Payer: Medicare Other

## 2016-11-24 VITALS — BP 104/67 | HR 62 | Temp 98.0°F | Resp 16 | Wt 166.0 lb

## 2016-11-24 DIAGNOSIS — D473 Essential (hemorrhagic) thrombocythemia: Secondary | ICD-10-CM | POA: Diagnosis not present

## 2016-11-24 DIAGNOSIS — D573 Sickle-cell trait: Secondary | ICD-10-CM | POA: Diagnosis not present

## 2016-11-24 LAB — CMP (CANCER CENTER ONLY)
ALT(SGPT): 20 U/L (ref 10–47)
AST: 32 U/L (ref 11–38)
Albumin: 3.4 g/dL (ref 3.3–5.5)
Alkaline Phosphatase: 102 U/L — ABNORMAL HIGH (ref 26–84)
BILIRUBIN TOTAL: 1 mg/dL (ref 0.20–1.60)
BUN, Bld: 6 mg/dL — ABNORMAL LOW (ref 7–22)
CHLORIDE: 100 meq/L (ref 98–108)
CO2: 35 meq/L — AB (ref 18–33)
CREATININE: 0.9 mg/dL (ref 0.6–1.2)
Calcium: 9.6 mg/dL (ref 8.0–10.3)
GLUCOSE: 91 mg/dL (ref 73–118)
Potassium: 2.9 mEq/L — CL (ref 3.3–4.7)
SODIUM: 140 meq/L (ref 128–145)
Total Protein: 7.1 g/dL (ref 6.4–8.1)

## 2016-11-24 LAB — CBC WITH DIFFERENTIAL (CANCER CENTER ONLY)
BASO#: 0 10*3/uL (ref 0.0–0.2)
BASO%: 0.4 % (ref 0.0–2.0)
EOS ABS: 0.1 10*3/uL (ref 0.0–0.5)
EOS%: 1 % (ref 0.0–7.0)
HCT: 41 % (ref 34.8–46.6)
HGB: 13.9 g/dL (ref 11.6–15.9)
LYMPH#: 2 10*3/uL (ref 0.9–3.3)
LYMPH%: 28.4 % (ref 14.0–48.0)
MCH: 29.3 pg (ref 26.0–34.0)
MCHC: 33.9 g/dL (ref 32.0–36.0)
MCV: 86 fL (ref 81–101)
MONO#: 0.4 10*3/uL (ref 0.1–0.9)
MONO%: 6.1 % (ref 0.0–13.0)
NEUT#: 4.5 10*3/uL (ref 1.5–6.5)
NEUT%: 64.1 % (ref 39.6–80.0)
Platelets: 451 10*3/uL — ABNORMAL HIGH (ref 145–400)
RBC: 4.75 10*6/uL (ref 3.70–5.32)
RDW: 14.2 % (ref 11.1–15.7)
WBC: 7.1 10*3/uL (ref 3.9–10.0)

## 2016-11-24 LAB — FERRITIN: Ferritin: 25 ng/ml (ref 9–269)

## 2016-11-24 LAB — IRON AND TIBC
%SAT: 48 % (ref 21–57)
IRON: 122 ug/dL (ref 41–142)
TIBC: 256 ug/dL (ref 236–444)
UIBC: 134 ug/dL (ref 120–384)

## 2016-11-24 MED ORDER — FOLIC ACID 1 MG PO TABS: 1.0000 mg | ORAL_TABLET | Freq: Every day | ORAL | 6 refills | Status: DC

## 2016-11-24 NOTE — Progress Notes (Signed)
Referral MD  Reason for Referral: Thrombocytosis; sickle cell trait   Chief Complaint  Patient presents with  . New Patient (Initial Visit)  : I'm not sure why I am here.  HPI: Ms. Zaugg is an incredibly charming 66 year old African-American female. There may be some American [place] in her. She is adopted.  Apparently, I have seen her before. She thinks it probably has been 8 or 9 years ago.  She sees Dr. Glendale Chard for internal medicine.  She has sickle cell trait. She is not on anything for this. We need to get her on folic acid. She has had her gallbladder out. This was removed when she was 66 years old.  She has been noted to have some thrombocytosis. Going through some of her records, her blood count has been elevated for a while. Going through the chart, back in 2011, her platelet count was 525,000.  With Dr. Baird Cancer records, back in November 2017, her platelet count was 535,000. It was down to 470,000 in January. Per she's not anemic or leukopenic. There is no leukocytosis. Her electrolytes all look okay.  She used to smoke. She stopped about 25 years ago. She really does not have alcohol. She is retired. Amazingly enough, she is a great-grandmother.  She has had no fever. There's been no pain in her hands or feet. She's had no nausea or vomiting. Thereafter she is not sure when her last mammogram was.  Since she is adopted, she does not know much in way of her family history. Both her adoptive parents have passed on.  There's been no weight loss or weight gain. She's had no bony pain.  Overall, her performance status is ECOG 0.    Past Medical History:  Diagnosis Date  . Thyroid disease   :  Past Surgical History:  Procedure Laterality Date  . ABDOMINAL HYSTERECTOMY    . CESAREAN SECTION    :   Current Outpatient Prescriptions:  .  cholecalciferol (VITAMIN D) 1000 units tablet, Take 1,000 Units by mouth daily., Disp: , Rfl:  .  aspirin EC 81 MG tablet,  Take 81 mg by mouth daily., Disp: , Rfl:  .  estrogens, conjugated, (PREMARIN) 1.25 MG tablet, Take 1.25 mg by mouth daily., Disp: , Rfl:  .  levothyroxine (SYNTHROID, LEVOTHROID) 100 MCG tablet, Take 100 mcg by mouth daily before breakfast., Disp: , Rfl:  .  methocarbamol (ROBAXIN) 500 MG tablet, Take 1 tablet (500 mg total) by mouth 4 (four) times daily., Disp: 20 tablet, Rfl: 0 .  Multiple Vitamins-Minerals (MULTIVITAMIN WITH MINERALS) tablet, Take 1 tablet by mouth daily., Disp: , Rfl:  .  zolpidem (AMBIEN) 10 MG tablet, Take 10 mg by mouth at bedtime., Disp: , Rfl: :  :  No Known Allergies:  No family history on file.:  Social History   Social History  . Marital status: Single    Spouse name: N/A  . Number of children: N/A  . Years of education: N/A   Occupational History  . Not on file.   Social History Main Topics  . Smoking status: Never Smoker  . Smokeless tobacco: Not on file  . Alcohol use Yes     Comment: occ  . Drug use: Unknown  . Sexual activity: Not on file   Other Topics Concern  . Not on file   Social History Narrative  . No narrative on file  :  Pertinent items are noted in HPI.  Exam:Well-developed and well-nourished [place] American female  in no obvious distress. Vital signs are temperature of 98. Pulse 62. Blood pressure 06/25/1967. Weight is 166 pounds. Head neck exam shows no ocular or oral lesion. She has no scleral icterus. She has no adenopathy in the neck. Thyroid is nonpalpable. Lungs are clear bilaterally. Cardiac exam regular rate and rhythm with no murmurs, rubs or bruits. Abdomen is soft. She has good bowel sounds. She has well-healed laparoscopy scars. There is no guarding or rebound tenderness. She has no palpable liver or spleen tip. Back exam shows no tenderness over the spine, ribs or hips. Extremities shows no clubbing, cyanosis or edema. She has good range motion of her joints. There is no tenderness over her long bones. Neurological  exam shows no focal neurological deficits. Skin exam shows no rashes, ecchymoses or petechia.    Recent Labs  11/24/16 0834  WBC 7.1  HGB 13.9  HCT 41.0  PLT 451*   No results for input(s): NA, K, CL, CO2, GLUCOSE, BUN, CREATININE, CALCIUM in the last 72 hours.  Blood smear review: Normochromic and normocytic population of red blood cells. There are no nucleated red blood cells. There are no target cells. I see no inclusion bodies. White cells are normal in morphology maturation. There is no immature myeloid or lymphoid forms. Platelets are minimally increased in number. Platelets are well granulated.  Pathology: None     Assessment and Plan:  Ms. Lindo is a very charming 66 year old African-American female. I think there might be part [place]. Pap she has thrombocytosis. I would be shocked if she actually has essential thrombocythemia. I don't see any on her exam oral under the microscope that looks like a myeloproliferative process.  I think some of the platelet elevation might be from her sickle cell trait. 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 As such, this might lead to some platelet elevation.  She definitely needs folic acid. We will call in Folic acid 1 mg a day. She is already on baby aspirin. I think this is a great idea.  I'll see that we have to do any ultrasounds. I don't see that we have to do a bone marrow test on her.  I'm just absolutely amazed as healthy young she looks. I cannot believe that she is a great-grandmother.  I forgot to mention that her husband was with [place]. I answered all their questions. I went over the lab work. I reassured her. Per Rafaela like to see her back in 4 months. I think everything was good in 4 months, then we can probably let her go from the clinic.

## 2016-11-24 NOTE — Telephone Encounter (Signed)
Critical Value Potassium 2.9 Dr Marin Olp notified. No orders at this time, but request for labs to be sent to PCP. Sent.

## 2016-11-25 NOTE — Progress Notes (Signed)
Iona Beard from [facility] called in regards the comprehensive Von Willebrand panel ordered yesterday. He stated that the sample sent was insufficient for testing. Told him to cancel the test per Dr. Marin Olp verbal instructions.

## 2016-12-03 DIAGNOSIS — M546 Pain in thoracic spine: Secondary | ICD-10-CM | POA: Diagnosis not present

## 2016-12-03 DIAGNOSIS — M5134 Other intervertebral disc degeneration, thoracic region: Secondary | ICD-10-CM | POA: Diagnosis not present

## 2016-12-03 DIAGNOSIS — M47814 Spondylosis without myelopathy or radiculopathy, thoracic region: Secondary | ICD-10-CM | POA: Diagnosis not present

## 2016-12-03 DIAGNOSIS — M9902 Segmental and somatic dysfunction of thoracic region: Secondary | ICD-10-CM | POA: Diagnosis not present

## 2016-12-14 LAB — CALR MUTATAION(GENPATH)

## 2017-01-11 ENCOUNTER — Other Ambulatory Visit: Payer: Self-pay

## 2017-01-14 DIAGNOSIS — M9902 Segmental and somatic dysfunction of thoracic region: Secondary | ICD-10-CM | POA: Diagnosis not present

## 2017-01-14 DIAGNOSIS — M47814 Spondylosis without myelopathy or radiculopathy, thoracic region: Secondary | ICD-10-CM | POA: Diagnosis not present

## 2017-01-14 DIAGNOSIS — M5134 Other intervertebral disc degeneration, thoracic region: Secondary | ICD-10-CM | POA: Diagnosis not present

## 2017-01-14 DIAGNOSIS — M546 Pain in thoracic spine: Secondary | ICD-10-CM | POA: Diagnosis not present

## 2017-02-11 DIAGNOSIS — M546 Pain in thoracic spine: Secondary | ICD-10-CM | POA: Diagnosis not present

## 2017-02-11 DIAGNOSIS — M9902 Segmental and somatic dysfunction of thoracic region: Secondary | ICD-10-CM | POA: Diagnosis not present

## 2017-02-11 DIAGNOSIS — M47814 Spondylosis without myelopathy or radiculopathy, thoracic region: Secondary | ICD-10-CM | POA: Diagnosis not present

## 2017-02-11 DIAGNOSIS — M5134 Other intervertebral disc degeneration, thoracic region: Secondary | ICD-10-CM | POA: Diagnosis not present

## 2017-03-30 ENCOUNTER — Ambulatory Visit: Payer: Medicare Other | Admitting: Hematology & Oncology

## 2017-03-30 ENCOUNTER — Other Ambulatory Visit: Payer: Medicare Other

## 2017-06-02 DIAGNOSIS — Z7982 Long term (current) use of aspirin: Secondary | ICD-10-CM | POA: Diagnosis not present

## 2017-06-02 DIAGNOSIS — Z0000 Encounter for general adult medical examination without abnormal findings: Secondary | ICD-10-CM | POA: Diagnosis not present

## 2017-06-02 DIAGNOSIS — E559 Vitamin D deficiency, unspecified: Secondary | ICD-10-CM | POA: Diagnosis not present

## 2017-06-02 DIAGNOSIS — I10 Essential (primary) hypertension: Secondary | ICD-10-CM | POA: Diagnosis not present

## 2017-06-02 DIAGNOSIS — E039 Hypothyroidism, unspecified: Secondary | ICD-10-CM | POA: Diagnosis not present

## 2017-06-02 DIAGNOSIS — R7309 Other abnormal glucose: Secondary | ICD-10-CM | POA: Diagnosis not present

## 2017-06-11 DIAGNOSIS — M47812 Spondylosis without myelopathy or radiculopathy, cervical region: Secondary | ICD-10-CM | POA: Diagnosis not present

## 2017-06-11 DIAGNOSIS — S139XXA Sprain of joints and ligaments of unspecified parts of neck, initial encounter: Secondary | ICD-10-CM | POA: Diagnosis not present

## 2017-06-11 DIAGNOSIS — Z041 Encounter for examination and observation following transport accident: Secondary | ICD-10-CM | POA: Diagnosis not present

## 2017-06-11 DIAGNOSIS — S335XXA Sprain of ligaments of lumbar spine, initial encounter: Secondary | ICD-10-CM | POA: Diagnosis not present

## 2017-06-17 ENCOUNTER — Ambulatory Visit (HOSPITAL_BASED_OUTPATIENT_CLINIC_OR_DEPARTMENT_OTHER): Payer: Medicare Other

## 2017-06-17 ENCOUNTER — Ambulatory Visit (HOSPITAL_BASED_OUTPATIENT_CLINIC_OR_DEPARTMENT_OTHER): Payer: Medicare Other | Admitting: Hematology & Oncology

## 2017-06-17 ENCOUNTER — Encounter: Payer: Self-pay | Admitting: Gastroenterology

## 2017-06-17 ENCOUNTER — Encounter: Payer: Self-pay | Admitting: Hematology & Oncology

## 2017-06-17 VITALS — BP 119/66 | HR 76 | Temp 98.4°F

## 2017-06-17 DIAGNOSIS — D473 Essential (hemorrhagic) thrombocythemia: Secondary | ICD-10-CM

## 2017-06-17 DIAGNOSIS — D573 Sickle-cell trait: Secondary | ICD-10-CM

## 2017-06-17 DIAGNOSIS — M79605 Pain in left leg: Secondary | ICD-10-CM | POA: Diagnosis not present

## 2017-06-17 LAB — CBC WITH DIFFERENTIAL (CANCER CENTER ONLY)
BASO#: 0 10*3/uL (ref 0.0–0.2)
BASO%: 0.4 % (ref 0.0–2.0)
EOS ABS: 0.1 10*3/uL (ref 0.0–0.5)
EOS%: 1.6 % (ref 0.0–7.0)
HEMATOCRIT: 38.1 % (ref 34.8–46.6)
HEMOGLOBIN: 13 g/dL (ref 11.6–15.9)
LYMPH#: 2.1 10*3/uL (ref 0.9–3.3)
LYMPH%: 29 % (ref 14.0–48.0)
MCH: 28.8 pg (ref 26.0–34.0)
MCHC: 34.1 g/dL (ref 32.0–36.0)
MCV: 84 fL (ref 81–101)
MONO#: 0.4 10*3/uL (ref 0.1–0.9)
MONO%: 5.6 % (ref 0.0–13.0)
NEUT%: 63.4 % (ref 39.6–80.0)
NEUTROS ABS: 4.5 10*3/uL (ref 1.5–6.5)
RBC: 4.52 10*6/uL (ref 3.70–5.32)
RDW: 13.6 % (ref 11.1–15.7)
WBC: 7.1 10*3/uL (ref 3.9–10.0)

## 2017-06-17 LAB — CHCC SATELLITE - SMEAR
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
## 2017-06-18 LAB — RETICULOCYTES: Reticulocyte Count: 1.4 % (ref 0.6–2.6)

## 2017-06-21 LAB — HEMOGLOBINOPATHY EVALUATION
HEMOGLOBIN A2 QUANTITATION: 3.7 % — AB (ref 1.8–3.2)
HGB C: 0 %
HGB S: 33.1 % — ABNORMAL HIGH
HGB VARIANT: 0 %
Hemoglobin F Quantitation: 0 % (ref 0.0–2.0)
Hgb A: 63.2 % — ABNORMAL LOW (ref 96.4–98.8)

## 2017-06-25 ENCOUNTER — Other Ambulatory Visit: Payer: Medicare Other

## 2017-06-25 ENCOUNTER — Ambulatory Visit: Payer: Medicare Other | Admitting: Family

## 2017-07-05 DIAGNOSIS — M79605 Pain in left leg: Secondary | ICD-10-CM | POA: Diagnosis not present

## 2017-07-20 DIAGNOSIS — M545 Low back pain: Secondary | ICD-10-CM | POA: Diagnosis not present

## 2017-07-20 DIAGNOSIS — M25511 Pain in right shoulder: Secondary | ICD-10-CM | POA: Diagnosis not present

## 2017-07-25 DIAGNOSIS — T1591XA Foreign body on external eye, part unspecified, right eye, initial encounter: Secondary | ICD-10-CM | POA: Diagnosis not present

## 2017-07-27 ENCOUNTER — Ambulatory Visit (AMBULATORY_SURGERY_CENTER): Payer: Self-pay

## 2017-07-27 VITALS — Ht 67.0 in | Wt 163.4 lb

## 2017-07-27 DIAGNOSIS — Z1211 Encounter for screening for malignant neoplasm of colon: Secondary | ICD-10-CM

## 2017-07-27 DIAGNOSIS — M25511 Pain in right shoulder: Secondary | ICD-10-CM | POA: Diagnosis not present

## 2017-07-27 DIAGNOSIS — M545 Low back pain: Secondary | ICD-10-CM | POA: Diagnosis not present

## 2017-07-27 MED ORDER — NA SULFATE-K SULFATE-MG SULF 17.5-3.13-1.6 GM/177ML PO SOLN: 1.0000 | Freq: Once | ORAL | 0 refills | Status: AC

## 2017-07-27 NOTE — Progress Notes (Signed)
Per pt, no allergies to soy or egg products.Pt not taking any weight loss meds or using  O2 at home.  Emmi video sent to patient's email 

## 2017-07-30 DIAGNOSIS — M75122 Complete rotator cuff tear or rupture of left shoulder, not specified as traumatic: Secondary | ICD-10-CM | POA: Diagnosis not present

## 2017-07-30 DIAGNOSIS — M48062 Spinal stenosis, lumbar region with neurogenic claudication: Secondary | ICD-10-CM | POA: Diagnosis not present

## 2017-08-04 DIAGNOSIS — M4807 Spinal stenosis, lumbosacral region: Secondary | ICD-10-CM | POA: Diagnosis not present

## 2017-08-04 DIAGNOSIS — M5416 Radiculopathy, lumbar region: Secondary | ICD-10-CM | POA: Diagnosis not present

## 2017-08-04 DIAGNOSIS — M545 Low back pain: Secondary | ICD-10-CM | POA: Diagnosis not present

## 2017-08-05 DIAGNOSIS — M48062 Spinal stenosis, lumbar region with neurogenic claudication: Secondary | ICD-10-CM | POA: Diagnosis not present

## 2017-08-10 ENCOUNTER — Ambulatory Visit (AMBULATORY_SURGERY_CENTER): Payer: Medicare Other | Admitting: Gastroenterology

## 2017-08-10 ENCOUNTER — Other Ambulatory Visit: Payer: Self-pay

## 2017-08-10 ENCOUNTER — Encounter: Payer: Self-pay | Admitting: Gastroenterology

## 2017-08-10 VITALS — BP 143/76 | HR 61 | Temp 97.1°F | Resp 16 | Ht 67.0 in | Wt 163.0 lb

## 2017-08-10 DIAGNOSIS — E039 Hypothyroidism, unspecified: Secondary | ICD-10-CM | POA: Diagnosis not present

## 2017-08-10 DIAGNOSIS — D122 Benign neoplasm of ascending colon: Secondary | ICD-10-CM

## 2017-08-10 DIAGNOSIS — Z1211 Encounter for screening for malignant neoplasm of colon: Secondary | ICD-10-CM

## 2017-08-10 DIAGNOSIS — D124 Benign neoplasm of descending colon: Secondary | ICD-10-CM

## 2017-08-10 MED ORDER — SODIUM CHLORIDE 0.9 % IV SOLN: 500.0000 mL | Freq: Once | INTRAVENOUS | Status: DC

## 2017-08-10 NOTE — Progress Notes (Signed)
Report given to PACU, vss 

## 2017-08-10 NOTE — Progress Notes (Signed)
Pt's states no medical or surgical changes since previsit or office visit. Maw   Pt has right rotator cuff tear.  She can only moves her should carefully.  Having has surgery to repair the tear next Tuesday.  Pt said okay to start IV on her right side.  maw

## 2017-08-10 NOTE — Progress Notes (Signed)
Called to room to assist during endoscopic procedure.  Patient ID and intended procedure confirmed with present staff. Received instructions for my participation in the procedure from the performing physician.  

## 2017-08-10 NOTE — Patient Instructions (Signed)
HANDOUT GIVEN FOR POLYPS  YOU HAD AN ENDOSCOPIC PROCEDURE TODAY AT THE [facility]:   Refer to the procedure report that was given to you for any specific questions about what was found during the examination.  If the procedure report does not answer your questions, please call your gastroenterologist to clarify.  If you requested that your care partner not be given the details of your procedure findings, then the procedure report has been included in a sealed envelope for you to review at your convenience later.  YOU SHOULD EXPECT: Some feelings of bloating in the abdomen. Passage of more gas than usual.  Walking can help get rid of the air that was put into your GI tract during the procedure and reduce the bloating. If you had a lower endoscopy (such as a colonoscopy or flexible sigmoidoscopy) you may notice spotting of blood in your stool or on the toilet paper. If you underwent a bowel prep for your procedure, you may not have a normal bowel movement for a few days.  Please Note:  You might notice some irritation and congestion in your nose or some drainage.  This is from the oxygen used during your procedure.  There is no need for concern and it should clear up in a day or so.  SYMPTOMS TO REPORT IMMEDIATELY:   Following lower endoscopy (colonoscopy or flexible sigmoidoscopy):  Excessive amounts of blood in the stool  Significant tenderness or worsening of abdominal pains  Swelling of the abdomen that is new, acute  Fever of 100F or higher  For urgent or emergent issues, a gastroenterologist can be reached at any hour by calling [phone].   DIET:  We do recommend a small meal at first, but then you may proceed to your regular diet.  Drink plenty of fluids but you should avoid alcoholic beverages for 24 hours.  ACTIVITY:  You should plan to take it easy for the rest of today and you should NOT DRIVE or use heavy machinery until tomorrow (because of the sedation medicines  used during the test).    FOLLOW UP: Our staff will call the number listed on your records the next business day following your procedure to check on you and address any questions or concerns that you may have regarding the information given to you following your procedure. If we do not reach you, we will leave a message.  However, if you are feeling well and you are not experiencing any problems, there is no need to return our call.  We will assume that you have returned to your regular daily activities without incident.  If any biopsies were taken you will be contacted by phone or by letter within the next 1-3 weeks.  Please call us at [phone] if you have not heard about the biopsies in 3 weeks.    SIGNATURES/CONFIDENTIALITY: You and/or your care partner have signed paperwork which will be entered into your electronic medical record.  These signatures attest to the fact that that the information above on your After Visit Summary has been reviewed and is understood.  Full responsibility of the confidentiality of this discharge information lies with you and/or your care-partner. 

## 2017-08-10 NOTE — Op Note (Signed)
[facility] Patient Name: Maria Hall Procedure Date: 08/10/2017 9:18 AM MRN: [id number] Endoscopist: Remo Lipps P. Lott Seelbach MD, MD Age: 67 Referring MD:  Date of Birth: 06-09-1951 Gender: Female Account #: [id number] Procedure:                Colonoscopy Indications:              Screening for colorectal malignant neoplasm Medicines:                Monitored Anesthesia Care Procedure:                Pre-Anesthesia Assessment:                           - Prior to the procedure, a History and Physical                            was performed, and patient medications and                            allergies were reviewed. The patient's tolerance of                            previous anesthesia was also reviewed. The risks                            and benefits of the procedure and the sedation                            options and risks were discussed with the patient.                            All questions were answered, and informed consent                            was obtained. Prior Anticoagulants: The patient has                            taken no previous anticoagulant or antiplatelet                            agents. ASA Grade Assessment: II - A patient with                            mild systemic disease. After reviewing the risks                            and benefits, the patient was deemed in                            satisfactory condition to undergo the procedure.                           After obtaining informed consent, the colonoscope  was passed under direct vision. Throughout the                            procedure, the patient's blood pressure, pulse, and                            oxygen saturations were monitored continuously. The                            Colonoscope was introduced through the anus and                            advanced to the the cecum, identified by                            appendiceal  orifice and ileocecal valve. The                            colonoscopy was performed without difficulty. The                            patient tolerated the procedure well. The quality                            of the bowel preparation was adequate. The                            ileocecal valve, appendiceal orifice, and rectum                            were photographed. Scope In: 9:19:38 AM Scope Out: 9:42:04 AM Scope Withdrawal Time: 0 hours 17 minutes 55 seconds  Total Procedure Duration: 0 hours 22 minutes 26 seconds  Findings:                 The perianal and digital rectal examinations were                            normal.                           A 4 mm polyp was found in the ascending colon. The                            polyp was sessile. The polyp was removed with a                            cold snare. Resection and retrieval were complete.                           A 5 mm polyp was found in the descending colon. The                            polyp was sessile. The polyp was removed with a  cold snare. Resection and retrieval were complete.                           The colon was tortuous which prolonged the                            procedure.                           The rectal vault was small and not well seen on                            retroflexed views. Normal appearance with forward                            views. The exam was otherwise without abnormality. Complications:            No immediate complications. Estimated blood loss:                            Minimal. Estimated Blood Loss:     Estimated blood loss was minimal. Impression:               - One 4 mm polyp in the ascending colon, removed                            with a cold snare. Resected and retrieved.                           - One 5 mm polyp in the descending colon, removed                            with a cold snare. Resected and retrieved.                            - Tortuous colon.                           - Small rectal vault                           - The examination was otherwise normal. Recommendation:           - Patient has a contact number available for                            emergencies. The signs and symptoms of potential                            delayed complications were discussed with the                            patient. Return to normal activities tomorrow.                            Written discharge instructions were  provided to the                            patient.                           - Resume previous diet.                           - Continue present medications.                           - Repeat colonoscopy is recommended for                            surveillance. The colonoscopy date will be                            determined after pathology results from today's                            exam become available for review. Remo Lipps P. [name] MD, MD 08/10/2017 9:45:48 AM This report has been signed electronically.

## 2017-08-11 ENCOUNTER — Telehealth: Payer: Self-pay

## 2017-08-11 DIAGNOSIS — M545 Low back pain: Secondary | ICD-10-CM | POA: Diagnosis not present

## 2017-08-11 DIAGNOSIS — M5416 Radiculopathy, lumbar region: Secondary | ICD-10-CM | POA: Diagnosis not present

## 2017-08-11 DIAGNOSIS — M4807 Spinal stenosis, lumbosacral region: Secondary | ICD-10-CM | POA: Diagnosis not present

## 2017-08-11 NOTE — Telephone Encounter (Signed)
  Follow up Call-  Call back number 08/10/2017  Post procedure Call Back phone  # [phone] cell  Permission to leave phone message Yes  Some recent data might be hidden     Patient questions:  Do you have a fever, pain , or abdominal swelling? No. Pain Score  0 *  Have you tolerated food without any problems? Yes.    Have you been able to return to your normal activities? Yes.    Do you have any questions about your discharge instructions: Diet   No. Medications  No. Follow up visit  No.  Do you have questions or concerns about your Care? No.  Actions: * If pain score is 4 or above: No action needed, pain <4.

## 2017-08-13 DIAGNOSIS — M545 Low back pain: Secondary | ICD-10-CM | POA: Diagnosis not present

## 2017-08-13 DIAGNOSIS — M5416 Radiculopathy, lumbar region: Secondary | ICD-10-CM | POA: Diagnosis not present

## 2017-08-13 DIAGNOSIS — M4807 Spinal stenosis, lumbosacral region: Secondary | ICD-10-CM | POA: Diagnosis not present

## 2017-08-16 ENCOUNTER — Encounter: Payer: Self-pay | Admitting: Gastroenterology

## 2017-08-16 DIAGNOSIS — M545 Low back pain: Secondary | ICD-10-CM | POA: Diagnosis not present

## 2017-08-16 DIAGNOSIS — M4807 Spinal stenosis, lumbosacral region: Secondary | ICD-10-CM | POA: Diagnosis not present

## 2017-08-17 DIAGNOSIS — Y999 Unspecified external cause status: Secondary | ICD-10-CM | POA: Diagnosis not present

## 2017-08-17 DIAGNOSIS — S46011A Strain of muscle(s) and tendon(s) of the rotator cuff of right shoulder, initial encounter: Secondary | ICD-10-CM | POA: Diagnosis not present

## 2017-08-17 DIAGNOSIS — G8918 Other acute postprocedural pain: Secondary | ICD-10-CM | POA: Diagnosis not present

## 2017-08-17 DIAGNOSIS — X58XXXA Exposure to other specified factors, initial encounter: Secondary | ICD-10-CM | POA: Diagnosis not present

## 2017-08-17 DIAGNOSIS — M7541 Impingement syndrome of right shoulder: Secondary | ICD-10-CM | POA: Diagnosis not present

## 2017-08-23 DIAGNOSIS — Z9889 Other specified postprocedural states: Secondary | ICD-10-CM | POA: Diagnosis not present

## 2017-08-26 DIAGNOSIS — M25511 Pain in right shoulder: Secondary | ICD-10-CM | POA: Diagnosis not present

## 2017-08-26 DIAGNOSIS — Z4789 Encounter for other orthopedic aftercare: Secondary | ICD-10-CM | POA: Diagnosis not present

## 2017-08-26 DIAGNOSIS — M75121 Complete rotator cuff tear or rupture of right shoulder, not specified as traumatic: Secondary | ICD-10-CM | POA: Diagnosis not present

## 2017-08-27 DIAGNOSIS — Z4789 Encounter for other orthopedic aftercare: Secondary | ICD-10-CM | POA: Diagnosis not present

## 2017-08-27 DIAGNOSIS — M75121 Complete rotator cuff tear or rupture of right shoulder, not specified as traumatic: Secondary | ICD-10-CM | POA: Diagnosis not present

## 2017-08-27 DIAGNOSIS — M48062 Spinal stenosis, lumbar region with neurogenic claudication: Secondary | ICD-10-CM | POA: Diagnosis not present

## 2017-08-27 DIAGNOSIS — M25511 Pain in right shoulder: Secondary | ICD-10-CM | POA: Diagnosis not present

## 2017-09-01 DIAGNOSIS — M75121 Complete rotator cuff tear or rupture of right shoulder, not specified as traumatic: Secondary | ICD-10-CM | POA: Diagnosis not present

## 2017-09-01 DIAGNOSIS — M25511 Pain in right shoulder: Secondary | ICD-10-CM | POA: Diagnosis not present

## 2017-09-01 DIAGNOSIS — Z4789 Encounter for other orthopedic aftercare: Secondary | ICD-10-CM | POA: Diagnosis not present

## 2017-09-06 DIAGNOSIS — M75121 Complete rotator cuff tear or rupture of right shoulder, not specified as traumatic: Secondary | ICD-10-CM | POA: Diagnosis not present

## 2017-09-06 DIAGNOSIS — M25511 Pain in right shoulder: Secondary | ICD-10-CM | POA: Diagnosis not present

## 2017-09-06 DIAGNOSIS — Z4789 Encounter for other orthopedic aftercare: Secondary | ICD-10-CM | POA: Diagnosis not present

## 2017-09-13 DIAGNOSIS — M25511 Pain in right shoulder: Secondary | ICD-10-CM | POA: Diagnosis not present

## 2017-09-13 DIAGNOSIS — Z4789 Encounter for other orthopedic aftercare: Secondary | ICD-10-CM | POA: Diagnosis not present

## 2017-09-13 DIAGNOSIS — M75121 Complete rotator cuff tear or rupture of right shoulder, not specified as traumatic: Secondary | ICD-10-CM | POA: Diagnosis not present

## 2017-09-22 DIAGNOSIS — M75121 Complete rotator cuff tear or rupture of right shoulder, not specified as traumatic: Secondary | ICD-10-CM | POA: Diagnosis not present

## 2017-09-22 DIAGNOSIS — M25511 Pain in right shoulder: Secondary | ICD-10-CM | POA: Diagnosis not present

## 2017-09-22 DIAGNOSIS — Z4789 Encounter for other orthopedic aftercare: Secondary | ICD-10-CM | POA: Diagnosis not present

## 2017-09-27 DIAGNOSIS — M25511 Pain in right shoulder: Secondary | ICD-10-CM | POA: Diagnosis not present

## 2017-09-27 DIAGNOSIS — Z9889 Other specified postprocedural states: Secondary | ICD-10-CM | POA: Diagnosis not present

## 2017-09-27 DIAGNOSIS — Z4789 Encounter for other orthopedic aftercare: Secondary | ICD-10-CM | POA: Diagnosis not present

## 2017-09-27 DIAGNOSIS — M75121 Complete rotator cuff tear or rupture of right shoulder, not specified as traumatic: Secondary | ICD-10-CM | POA: Diagnosis not present

## 2017-09-29 DIAGNOSIS — M75121 Complete rotator cuff tear or rupture of right shoulder, not specified as traumatic: Secondary | ICD-10-CM | POA: Diagnosis not present

## 2017-09-29 DIAGNOSIS — Z4789 Encounter for other orthopedic aftercare: Secondary | ICD-10-CM | POA: Diagnosis not present

## 2017-09-29 DIAGNOSIS — M25511 Pain in right shoulder: Secondary | ICD-10-CM | POA: Diagnosis not present

## 2017-10-04 DIAGNOSIS — M25511 Pain in right shoulder: Secondary | ICD-10-CM | POA: Diagnosis not present

## 2017-10-04 DIAGNOSIS — M75121 Complete rotator cuff tear or rupture of right shoulder, not specified as traumatic: Secondary | ICD-10-CM | POA: Diagnosis not present

## 2017-10-04 DIAGNOSIS — Z4789 Encounter for other orthopedic aftercare: Secondary | ICD-10-CM | POA: Diagnosis not present

## 2017-10-06 DIAGNOSIS — M25511 Pain in right shoulder: Secondary | ICD-10-CM | POA: Diagnosis not present

## 2017-10-06 DIAGNOSIS — M75121 Complete rotator cuff tear or rupture of right shoulder, not specified as traumatic: Secondary | ICD-10-CM | POA: Diagnosis not present

## 2017-10-06 DIAGNOSIS — Z4789 Encounter for other orthopedic aftercare: Secondary | ICD-10-CM | POA: Diagnosis not present

## 2017-10-11 DIAGNOSIS — M75121 Complete rotator cuff tear or rupture of right shoulder, not specified as traumatic: Secondary | ICD-10-CM | POA: Diagnosis not present

## 2017-10-11 DIAGNOSIS — Z4789 Encounter for other orthopedic aftercare: Secondary | ICD-10-CM | POA: Diagnosis not present

## 2017-10-11 DIAGNOSIS — M25511 Pain in right shoulder: Secondary | ICD-10-CM | POA: Diagnosis not present

## 2017-10-13 DIAGNOSIS — Z4789 Encounter for other orthopedic aftercare: Secondary | ICD-10-CM | POA: Diagnosis not present

## 2017-10-13 DIAGNOSIS — M75121 Complete rotator cuff tear or rupture of right shoulder, not specified as traumatic: Secondary | ICD-10-CM | POA: Diagnosis not present

## 2017-10-13 DIAGNOSIS — M25511 Pain in right shoulder: Secondary | ICD-10-CM | POA: Diagnosis not present

## 2017-10-18 DIAGNOSIS — M75121 Complete rotator cuff tear or rupture of right shoulder, not specified as traumatic: Secondary | ICD-10-CM | POA: Diagnosis not present

## 2017-10-18 DIAGNOSIS — M25511 Pain in right shoulder: Secondary | ICD-10-CM | POA: Diagnosis not present

## 2017-10-18 DIAGNOSIS — Z4789 Encounter for other orthopedic aftercare: Secondary | ICD-10-CM | POA: Diagnosis not present

## 2017-10-20 DIAGNOSIS — M75121 Complete rotator cuff tear or rupture of right shoulder, not specified as traumatic: Secondary | ICD-10-CM | POA: Diagnosis not present

## 2017-10-20 DIAGNOSIS — M25511 Pain in right shoulder: Secondary | ICD-10-CM | POA: Diagnosis not present

## 2017-10-20 DIAGNOSIS — Z4789 Encounter for other orthopedic aftercare: Secondary | ICD-10-CM | POA: Diagnosis not present

## 2017-10-26 DIAGNOSIS — M48062 Spinal stenosis, lumbar region with neurogenic claudication: Secondary | ICD-10-CM | POA: Diagnosis not present

## 2017-10-29 DIAGNOSIS — M75121 Complete rotator cuff tear or rupture of right shoulder, not specified as traumatic: Secondary | ICD-10-CM | POA: Diagnosis not present

## 2017-10-29 DIAGNOSIS — M25511 Pain in right shoulder: Secondary | ICD-10-CM | POA: Diagnosis not present

## 2017-10-29 DIAGNOSIS — Z4789 Encounter for other orthopedic aftercare: Secondary | ICD-10-CM | POA: Diagnosis not present

## 2017-11-01 DIAGNOSIS — M25511 Pain in right shoulder: Secondary | ICD-10-CM | POA: Diagnosis not present

## 2017-11-01 DIAGNOSIS — Z4789 Encounter for other orthopedic aftercare: Secondary | ICD-10-CM | POA: Diagnosis not present

## 2017-11-01 DIAGNOSIS — M75121 Complete rotator cuff tear or rupture of right shoulder, not specified as traumatic: Secondary | ICD-10-CM | POA: Diagnosis not present

## 2017-11-03 DIAGNOSIS — M25511 Pain in right shoulder: Secondary | ICD-10-CM | POA: Diagnosis not present

## 2017-11-03 DIAGNOSIS — M75121 Complete rotator cuff tear or rupture of right shoulder, not specified as traumatic: Secondary | ICD-10-CM | POA: Diagnosis not present

## 2017-11-03 DIAGNOSIS — Z4789 Encounter for other orthopedic aftercare: Secondary | ICD-10-CM | POA: Diagnosis not present

## 2017-11-08 DIAGNOSIS — H2513 Age-related nuclear cataract, bilateral: Secondary | ICD-10-CM | POA: Diagnosis not present

## 2017-11-08 DIAGNOSIS — H10413 Chronic giant papillary conjunctivitis, bilateral: Secondary | ICD-10-CM | POA: Diagnosis not present

## 2017-11-08 DIAGNOSIS — M75121 Complete rotator cuff tear or rupture of right shoulder, not specified as traumatic: Secondary | ICD-10-CM | POA: Diagnosis not present

## 2017-11-08 DIAGNOSIS — Z4789 Encounter for other orthopedic aftercare: Secondary | ICD-10-CM | POA: Diagnosis not present

## 2017-11-08 DIAGNOSIS — M25511 Pain in right shoulder: Secondary | ICD-10-CM | POA: Diagnosis not present

## 2017-11-10 DIAGNOSIS — M25511 Pain in right shoulder: Secondary | ICD-10-CM | POA: Diagnosis not present

## 2017-11-10 DIAGNOSIS — M75121 Complete rotator cuff tear or rupture of right shoulder, not specified as traumatic: Secondary | ICD-10-CM | POA: Diagnosis not present

## 2017-11-10 DIAGNOSIS — Z4789 Encounter for other orthopedic aftercare: Secondary | ICD-10-CM | POA: Diagnosis not present

## 2017-11-10 DIAGNOSIS — Z9889 Other specified postprocedural states: Secondary | ICD-10-CM | POA: Diagnosis not present

## 2017-11-17 DIAGNOSIS — M75121 Complete rotator cuff tear or rupture of right shoulder, not specified as traumatic: Secondary | ICD-10-CM | POA: Diagnosis not present

## 2017-11-17 DIAGNOSIS — M25511 Pain in right shoulder: Secondary | ICD-10-CM | POA: Diagnosis not present

## 2017-11-17 DIAGNOSIS — Z4789 Encounter for other orthopedic aftercare: Secondary | ICD-10-CM | POA: Diagnosis not present

## 2017-11-19 DIAGNOSIS — Z4789 Encounter for other orthopedic aftercare: Secondary | ICD-10-CM | POA: Diagnosis not present

## 2017-11-19 DIAGNOSIS — M25511 Pain in right shoulder: Secondary | ICD-10-CM | POA: Diagnosis not present

## 2017-11-19 DIAGNOSIS — M75121 Complete rotator cuff tear or rupture of right shoulder, not specified as traumatic: Secondary | ICD-10-CM | POA: Diagnosis not present

## 2017-11-22 DIAGNOSIS — H1132 Conjunctival hemorrhage, left eye: Secondary | ICD-10-CM | POA: Diagnosis not present

## 2017-11-24 DIAGNOSIS — M25511 Pain in right shoulder: Secondary | ICD-10-CM | POA: Diagnosis not present

## 2017-11-24 DIAGNOSIS — M75121 Complete rotator cuff tear or rupture of right shoulder, not specified as traumatic: Secondary | ICD-10-CM | POA: Diagnosis not present

## 2017-11-24 DIAGNOSIS — Z4789 Encounter for other orthopedic aftercare: Secondary | ICD-10-CM | POA: Diagnosis not present

## 2017-11-26 DIAGNOSIS — Z4789 Encounter for other orthopedic aftercare: Secondary | ICD-10-CM | POA: Diagnosis not present

## 2017-11-26 DIAGNOSIS — M25511 Pain in right shoulder: Secondary | ICD-10-CM | POA: Diagnosis not present

## 2017-11-26 DIAGNOSIS — M75121 Complete rotator cuff tear or rupture of right shoulder, not specified as traumatic: Secondary | ICD-10-CM | POA: Diagnosis not present

## 2017-12-01 DIAGNOSIS — Z4789 Encounter for other orthopedic aftercare: Secondary | ICD-10-CM | POA: Diagnosis not present

## 2017-12-01 DIAGNOSIS — M75121 Complete rotator cuff tear or rupture of right shoulder, not specified as traumatic: Secondary | ICD-10-CM | POA: Diagnosis not present

## 2017-12-01 DIAGNOSIS — M25511 Pain in right shoulder: Secondary | ICD-10-CM | POA: Diagnosis not present

## 2017-12-02 DIAGNOSIS — M25511 Pain in right shoulder: Secondary | ICD-10-CM | POA: Diagnosis not present

## 2017-12-02 DIAGNOSIS — M75121 Complete rotator cuff tear or rupture of right shoulder, not specified as traumatic: Secondary | ICD-10-CM | POA: Diagnosis not present

## 2017-12-02 DIAGNOSIS — Z4789 Encounter for other orthopedic aftercare: Secondary | ICD-10-CM | POA: Diagnosis not present

## 2017-12-06 DIAGNOSIS — Z4789 Encounter for other orthopedic aftercare: Secondary | ICD-10-CM | POA: Diagnosis not present

## 2017-12-06 DIAGNOSIS — M75121 Complete rotator cuff tear or rupture of right shoulder, not specified as traumatic: Secondary | ICD-10-CM | POA: Diagnosis not present

## 2017-12-06 DIAGNOSIS — M25511 Pain in right shoulder: Secondary | ICD-10-CM | POA: Diagnosis not present

## 2017-12-08 DIAGNOSIS — R7309 Other abnormal glucose: Secondary | ICD-10-CM | POA: Diagnosis not present

## 2017-12-08 DIAGNOSIS — E039 Hypothyroidism, unspecified: Secondary | ICD-10-CM | POA: Diagnosis not present

## 2017-12-08 DIAGNOSIS — K0889 Other specified disorders of teeth and supporting structures: Secondary | ICD-10-CM | POA: Diagnosis not present

## 2017-12-08 DIAGNOSIS — I10 Essential (primary) hypertension: Secondary | ICD-10-CM | POA: Diagnosis not present

## 2017-12-08 DIAGNOSIS — G4700 Insomnia, unspecified: Secondary | ICD-10-CM | POA: Diagnosis not present

## 2017-12-15 DIAGNOSIS — Z4789 Encounter for other orthopedic aftercare: Secondary | ICD-10-CM | POA: Diagnosis not present

## 2017-12-15 DIAGNOSIS — M75121 Complete rotator cuff tear or rupture of right shoulder, not specified as traumatic: Secondary | ICD-10-CM | POA: Diagnosis not present

## 2017-12-15 DIAGNOSIS — M25511 Pain in right shoulder: Secondary | ICD-10-CM | POA: Diagnosis not present

## 2017-12-20 DIAGNOSIS — M25511 Pain in right shoulder: Secondary | ICD-10-CM | POA: Diagnosis not present

## 2017-12-22 ENCOUNTER — Ambulatory Visit
Admission: RE | Admit: 2017-12-22 | Discharge: 2017-12-22 | Disposition: A | Discharge status: Home / Self Care | Payer: Medicare Other | Source: Ambulatory Visit | Attending: Internal Medicine | Admitting: Internal Medicine

## 2017-12-22 ENCOUNTER — Other Ambulatory Visit: Payer: Self-pay | Admitting: Internal Medicine

## 2017-12-22 DIAGNOSIS — R0602 Shortness of breath: Secondary | ICD-10-CM

## 2017-12-22 DIAGNOSIS — R202 Paresthesia of skin: Secondary | ICD-10-CM | POA: Diagnosis not present

## 2017-12-22 DIAGNOSIS — D573 Sickle-cell trait: Secondary | ICD-10-CM | POA: Diagnosis not present

## 2017-12-22 DIAGNOSIS — D473 Essential (hemorrhagic) thrombocythemia: Secondary | ICD-10-CM | POA: Diagnosis not present

## 2018-02-01 DIAGNOSIS — E039 Hypothyroidism, unspecified: Secondary | ICD-10-CM | POA: Diagnosis not present

## 2018-02-03 ENCOUNTER — Other Ambulatory Visit: Payer: Self-pay | Admitting: Hematology & Oncology

## 2018-02-03 DIAGNOSIS — D573 Sickle-cell trait: Secondary | ICD-10-CM

## 2018-02-03 DIAGNOSIS — D473 Essential (hemorrhagic) thrombocythemia: Secondary | ICD-10-CM

## 2018-02-11 ENCOUNTER — Other Ambulatory Visit: Payer: Self-pay | Admitting: Internal Medicine

## 2018-02-11 DIAGNOSIS — Z1231 Encounter for screening mammogram for malignant neoplasm of breast: Secondary | ICD-10-CM

## 2018-02-24 DIAGNOSIS — R141 Gas pain: Secondary | ICD-10-CM | POA: Diagnosis not present

## 2018-03-15 ENCOUNTER — Ambulatory Visit
Admission: RE | Admit: 2018-03-15 | Discharge: 2018-03-15 | Disposition: A | Discharge status: Home / Self Care | Payer: Medicare Other | Source: Ambulatory Visit | Attending: Internal Medicine | Admitting: Internal Medicine

## 2018-03-15 DIAGNOSIS — Z1231 Encounter for screening mammogram for malignant neoplasm of breast: Secondary | ICD-10-CM

## 2018-03-21 ENCOUNTER — Other Ambulatory Visit: Payer: Self-pay | Admitting: Internal Medicine

## 2018-03-21 DIAGNOSIS — E039 Hypothyroidism, unspecified: Secondary | ICD-10-CM | POA: Diagnosis not present

## 2018-03-22 LAB — TSH: TSH: 0.309 u[IU]/mL — ABNORMAL LOW (ref 0.450–4.500)

## 2018-03-24 ENCOUNTER — Telehealth: Payer: Self-pay

## 2018-03-24 NOTE — Telephone Encounter (Signed)
The patient was asked if she is taking her Synthroid Mon-Sat and the pt said that she has been taking the 138mcg  Synthroid Mon - Sun.

## 2018-03-27 NOTE — Progress Notes (Signed)
Pt already contacted by MA. Her dose will be adjusted.

## 2018-03-28 ENCOUNTER — Other Ambulatory Visit: Payer: Self-pay

## 2018-03-28 ENCOUNTER — Telehealth: Payer: Self-pay

## 2018-03-28 MED ORDER — LEVOTHYROXINE SODIUM 88 MCG PO TABS: 88.0000 ug | ORAL_TABLET | Freq: Every day | ORAL | 1 refills | Status: DC

## 2018-03-28 NOTE — Telephone Encounter (Signed)
The patient was notified that Dr. Baird Cancer said to take her Synthroid Mon - Saturday and to skip Sundays.

## 2018-03-28 NOTE — Telephone Encounter (Signed)
Patient notified to take her synthroid mon - sat only.

## 2018-04-01 ENCOUNTER — Other Ambulatory Visit: Payer: Self-pay | Admitting: Internal Medicine

## 2018-04-18 ENCOUNTER — Ambulatory Visit (INDEPENDENT_AMBULATORY_CARE_PROVIDER_SITE_OTHER): Payer: Medicare Other | Admitting: Nurse Practitioner

## 2018-04-18 ENCOUNTER — Encounter: Payer: Self-pay | Admitting: Nurse Practitioner

## 2018-04-18 VITALS — BP 112/60 | HR 74 | Temp 97.8°F | Ht 67.0 in | Wt 159.6 lb

## 2018-04-18 DIAGNOSIS — J209 Acute bronchitis, unspecified: Secondary | ICD-10-CM

## 2018-04-18 MED ORDER — HYDROCODONE-HOMATROPINE 5-1.5 MG/5ML PO SYRP: 5.0000 mL | ORAL_SOLUTION | Freq: Four times a day (QID) | ORAL | 0 refills | Status: DC | PRN

## 2018-04-18 MED ORDER — PREDNISONE 20 MG PO TABS: 20.0000 mg | ORAL_TABLET | Freq: Every day | ORAL | 0 refills | Status: DC

## 2018-04-18 NOTE — Progress Notes (Signed)
Subjective:     Patient ID: Maria Hall , female    DOB: 08-12-1950 , 67 y.o.   MRN: [id number]   Chief Complaint  Patient presents with  . Cough    C/O a dry cough for 1 week, worse @ night     HPI  Cough  This is a new problem. The current episode started 1 to 4 weeks ago. The problem has been gradually worsening (worse at night). The cough is non-productive. Associated symptoms include a sore throat. Pertinent negatives include no chills, ear congestion, ear pain, fever, postnasal drip or shortness of breath. The symptoms are aggravated by cold air. Treatments tried: nyquil and zycam. The treatment provided no relief. Her past medical history is significant for bronchitis. There is no history of asthma.     Past Medical History:  Diagnosis Date  . 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 Bronchitis    gets every year in the winter  . Insomnia    takes meds  . Right rotator cuff tear   . Swelling    in hands and ankles  . Thyroid disease       Current Outpatient Medications:  .  acetaminophen (ARTHRITIS PAIN) 650 MG CR tablet, Take 650 mg by mouth. Take 2 caplets daily, Disp: , Rfl:  .  Ascorbic Acid (VITAMIN C) 1000 MG tablet, Take 1,000 mg by mouth daily., Disp: , Rfl:  .  aspirin EC 81 MG tablet, Take 81 mg by mouth daily., Disp: , Rfl:  .  cholecalciferol (VITAMIN D) 1000 units tablet, Take 1,000 Units by mouth daily., Disp: , Rfl:  .  Docusate Calcium (STOOL SOFTENER PO), Take 100 mg by mouth. Take 2 soft gels daily, Disp: , Rfl:  .  folic acid (FOLVITE) 1 MG tablet, TAKE 1 TABLET BY MOUTH EVERY DAY, Disp: 90 tablet, Rfl: 3 .  hydrochlorothiazide (MICROZIDE) 12.5 MG capsule, Take 12.5 mg by mouth daily., Disp: , Rfl:  .  levothyroxine (SYNTHROID, LEVOTHROID) 88 MCG tablet, Take 1 tablet (88 mcg total) by mouth daily before breakfast. Mon - Sat only, Disp: 30 tablet, Rfl: 1 .  methocarbamol (ROBAXIN) 500 MG tablet, Take 1 tablet (500 mg total) by mouth 4 (four)  times daily., Disp: 20 tablet, Rfl: 0 .  Omega 3-6-9 Fatty Acids (OMEGA 3-6-9 COMPLEX PO), Take 1,600 mg by mouth daily at 6 (six) AM., Disp: , Rfl:  .  traZODone (DESYREL) 50 MG tablet, TAKE 1 TABLET BY MOUTH EVERYDAY AT BEDTIME, Disp: 90 tablet, Rfl: 1 .  vitamin E 400 UNIT capsule, Take 400 Units by mouth daily., Disp: , Rfl:   Current Facility-Administered Medications:  .  0.9 %  sodium chloride infusion, 500 mL, Intravenous, Once, Armbruster, Carlota Raspberry, MD   No Known Allergies   Review of Systems  Constitutional: Negative for chills and fever.  HENT: Positive for sore throat. Negative for congestion, ear pain and postnasal drip.   Respiratory: Positive for cough. Negative for chest tightness and shortness of breath.   Cardiovascular: Negative.   Neurological: Negative.      Today's Vitals   04/18/18 1154  BP: 112/60  Pulse: 74  Temp: 97.8 F (36.6 C)  TempSrc: Oral  SpO2: 96%  Weight: 159 lb 9.6 oz (72.4 kg)  Height: 5\' 7"  (1.702 m)   Body mass index is 25 kg/m.   Objective:  Physical Exam  Constitutional: She appears well-developed and well-nourished.  Cardiovascular: Normal rate, regular rhythm and  normal heart sounds.  Pulmonary/Chest: Effort normal and breath sounds normal.  Hacking cough present  Skin: Skin is warm and dry.        Assessment And Plan:     1. Acute bronchitis, unspecified organism  Will treat with prednisone  Increase fluid intake.  Use Hydromet as needed for cough, do not drive or operate heavy machinery when taking. - predniSONE (DELTASONE) 20 MG tablet; Take 1 tablet (20 mg total) by mouth daily with breakfast.  Dispense: 10 tablet; Refill: 0 - HYDROcodone-homatropine (HYDROMET) 5-1.5 MG/5ML syrup; Take 5 mLs by mouth every 6 (six) hours as needed for cough.  Dispense: 120 mL; Refill: 0       Minette Brine, FNP

## 2018-04-18 NOTE — Patient Instructions (Signed)

## 2018-04-20 ENCOUNTER — Other Ambulatory Visit: Payer: Self-pay | Admitting: Internal Medicine

## 2018-04-21 DIAGNOSIS — M48062 Spinal stenosis, lumbar region with neurogenic claudication: Secondary | ICD-10-CM | POA: Diagnosis not present

## 2018-04-22 ENCOUNTER — Other Ambulatory Visit: Payer: Self-pay | Admitting: Internal Medicine

## 2018-05-14 ENCOUNTER — Other Ambulatory Visit: Payer: Self-pay | Admitting: Internal Medicine

## 2018-06-06 ENCOUNTER — Other Ambulatory Visit: Payer: Self-pay | Admitting: Internal Medicine

## 2018-06-07 ENCOUNTER — Other Ambulatory Visit: Payer: Self-pay | Admitting: Internal Medicine

## 2018-06-09 ENCOUNTER — Encounter: Payer: Self-pay | Admitting: Internal Medicine

## 2018-06-09 ENCOUNTER — Ambulatory Visit (INDEPENDENT_AMBULATORY_CARE_PROVIDER_SITE_OTHER): Payer: Medicare Other

## 2018-06-09 ENCOUNTER — Ambulatory Visit (INDEPENDENT_AMBULATORY_CARE_PROVIDER_SITE_OTHER): Payer: Medicare Other | Admitting: Internal Medicine

## 2018-06-09 VITALS — BP 118/64 | HR 58 | Temp 98.6°F | Ht 65.4 in | Wt 159.4 lb

## 2018-06-09 VITALS — BP 118/64 | HR 58 | Temp 98.6°F | Ht 65.4 in | Wt 156.4 lb

## 2018-06-09 DIAGNOSIS — R7309 Other abnormal glucose: Secondary | ICD-10-CM

## 2018-06-09 DIAGNOSIS — I10 Essential (primary) hypertension: Secondary | ICD-10-CM

## 2018-06-09 DIAGNOSIS — E039 Hypothyroidism, unspecified: Secondary | ICD-10-CM | POA: Diagnosis not present

## 2018-06-09 DIAGNOSIS — Z0000 Encounter for general adult medical examination without abnormal findings: Secondary | ICD-10-CM

## 2018-06-09 DIAGNOSIS — D573 Sickle-cell trait: Secondary | ICD-10-CM

## 2018-06-09 LAB — POCT URINALYSIS DIPSTICK
Bilirubin, UA: NEGATIVE
Blood, UA: NEGATIVE
Glucose, UA: NEGATIVE
Ketones, UA: NEGATIVE
Nitrite, UA: NEGATIVE
PH UA: 6.5 (ref 5.0–8.0)
Protein, UA: NEGATIVE
Spec Grav, UA: 1.02 (ref 1.010–1.025)
Urobilinogen, UA: 0.2 E.U./dL

## 2018-06-09 LAB — POCT UA - MICROALBUMIN
Albumin/Creatinine Ratio, Urine, POC: 30
Creatinine, POC: 300 mg/dL
MICROALBUMIN (UR) POC: 30 mg/L

## 2018-06-09 NOTE — Progress Notes (Signed)
Subjective:   Maria Hall is a 67 y.o. female who presents for Medicare Annual (Subsequent) preventive examination.  Review of Systems:  n/a Cardiac Risk Factors include: advanced age (>68men, >50 women);dyslipidemia;hypertension     Objective:     Vitals: BP 118/64 (BP Location: Left Arm, Patient Position: Sitting)   Pulse (!) 58   Temp 98.6 F (37 C) (Oral)   Ht 5' 5.4" (1.661 m)   Wt 159 lb 6.4 oz (72.3 kg)   SpO2 96%   BMI 26.20 kg/m   Body mass index is 26.2 kg/m.  Advanced Directives 06/09/2018 11/24/2016 11/24/2014 04/11/2014  Does Patient Have a Medical Advance Directive? Yes Yes No No  Type of Advance Directive Living will;Healthcare Power of [place] - -  Does patient want to make changes to medical advance directive? No - Patient declined - - -  Copy of [place] in Chart? No - copy requested - - -  Would patient like information on creating a medical advance directive? - - - No - patient declined information    Tobacco Social History   Tobacco Use  Smoking Status Former Smoker  . Types: Cigarettes  . Last attempt to quit: 1990  . Years since quitting: 29.9  Smokeless Tobacco Never Used     Counseling given: Not Answered   Clinical Intake:  Pre-visit preparation completed: Yes  Pain : No/denies pain Pain Score: 0-No pain     Nutritional Status: BMI 25 -29 Overweight Nutritional Risks: None Diabetes: No  How often do you need to have someone help you when you read instructions, pamphlets, or other written materials from your doctor or pharmacy?: 1 - Never What is the last grade level you completed in school?: post graduate  Interpreter Needed?: No  Information entered by :: NAllen LPN  Past Medical History:  Diagnosis Date  . 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 Bronchitis    gets every year in the winter  . Insomnia    takes meds  . Right rotator cuff tear   . Swelling    in  hands and ankles  . Thyroid disease    Past Surgical History:  Procedure Laterality Date  . ABDOMINAL HYSTERECTOMY    . BUNIONECTOMY     bil feet  . CESAREAN SECTION    . CESAREAN SECTION     1 time  . CHOLECYSTECTOMY    . COLONOSCOPY    . tummy tuck     Family History  Adopted: Yes   Social History   Socioeconomic History  . Marital status: Married    Spouse name: Not on file  . Number of children: Not on file  . Years of education: Not on file  . Highest education level: Not on file  Occupational History  . Occupation: retired  Scientific laboratory technician  . Financial resource strain: Not hard at all  . Food insecurity:    Worry: Never true    Inability: Never true  . Transportation needs:    Medical: No    Non-medical: No  Tobacco Use  . Smoking status: Former Smoker    Types: Cigarettes    Last attempt to quit: 1990    Years since quitting: 29.9  . Smokeless tobacco: Never Used  Substance and Sexual Activity  . Alcohol use: Yes    Comment: rare  . Drug use: No  . Sexual activity: Yes  Lifestyle  . Physical activity:  Days per week: 5 days    Minutes per session: 120 min  . Stress: Not at all  Relationships  . Social connections:    Talks on phone: Not on file    Gets together: Not on file    Attends religious service: Not on file    Active member of club or organization: Not on file    Attends meetings of clubs or organizations: Not on file    Relationship status: Not on file  Other Topics Concern  . Not on file  Social History Narrative  . Not on file    Outpatient Encounter Medications as of 06/09/2018  Medication Sig  . acetaminophen (ARTHRITIS PAIN) 650 MG CR tablet Take 650 mg by mouth. Take 2 caplets daily  . Ascorbic Acid (VITAMIN C) 1000 MG tablet Take 1,000 mg by mouth daily.  Marland Kitchen aspirin EC 81 MG tablet Take 81 mg by mouth daily.  . cholecalciferol (VITAMIN D) 1000 units tablet Take 1,000 Units by mouth daily.  Mariane Baumgarten Calcium (STOOL SOFTENER  PO) Take 100 mg by mouth. Take 2 soft gels daily  . fluticasone (FLONASE) 50 MCG/ACT nasal spray SPRAY 1 SPRAY INTO EACH NOSTRIL EVERY DAY  . folic acid (FOLVITE) 1 MG tablet TAKE 1 TABLET BY MOUTH EVERY DAY  . hydrochlorothiazide (HYDRODIURIL) 12.5 MG tablet TAKE 1 TABLET BY MOUTH EVERY DAY  . levothyroxine (SYNTHROID, LEVOTHROID) 88 MCG tablet TAKE 1 TABLET (88 MCG TOTAL) BY MOUTH DAILY BEFORE BREAKFAST. MON - SAT ONLY  . Omega 3-6-9 Fatty Acids (OMEGA 3-6-9 COMPLEX PO) Take 1,600 mg by mouth daily at 6 (six) AM.  . traZODone (DESYREL) 100 MG tablet TAKE 1 TABLET BY MOUTH EVERYDAY AT BEDTIME  . vitamin E 400 UNIT capsule Take 400 Units by mouth daily.  . hydrochlorothiazide (MICROZIDE) 12.5 MG capsule Take 12.5 mg by mouth daily.  Marland Kitchen HYDROcodone-homatropine (HYDROMET) 5-1.5 MG/5ML syrup Take 5 mLs by mouth every 6 (six) hours as needed for cough. (Patient not taking: Reported on 06/09/2018)  . methocarbamol (ROBAXIN) 500 MG tablet Take 1 tablet (500 mg total) by mouth 4 (four) times daily. (Patient not taking: Reported on 06/09/2018)  . predniSONE (DELTASONE) 20 MG tablet Take 1 tablet (20 mg total) by mouth daily with breakfast. (Patient not taking: Reported on 06/09/2018)  . traZODone (DESYREL) 50 MG tablet TAKE 1 TABLET BY MOUTH EVERYDAY AT BEDTIME (Patient not taking: Reported on 06/09/2018)   Facility-Administered Encounter Medications as of 06/09/2018  Medication  . 0.9 %  sodium chloride infusion    Activities of Daily Living In your present state of health, do you have any difficulty performing the following activities: 06/09/2018  Hearing? N  Vision? N  Difficulty concentrating or making decisions? Y  Comment forgetfulness  Walking or climbing stairs? N  Dressing or bathing? N  Doing errands, shopping? N  Preparing Food and eating ? N  Using the Toilet? N  In the past six months, have you accidently leaked urine? N  Do you have problems with loss of bowel control? N    Managing your Medications? N  Managing your Finances? N  Housekeeping or managing your Housekeeping? N  Some recent data might be hidden    Patient Care Team: Glendale Chard, MD as PCP - General (Internal Medicine) Warden Fillers, MD as Consulting Physician (Ophthalmology)    Assessment:   This is a routine wellness examination for [place].  Exercise Activities and Dietary recommendations Current Exercise Habits: Structured exercise class, Type of  exercise: walking;Other - see comments;strength training/weights(elliptical), Time (Minutes): > 60, Frequency (Times/Week): 5, Weekly Exercise (Minutes/Week): 0, Intensity: Moderate  Goals    . DIET - INCREASE WATER INTAKE (pt-stated)       Fall Risk Fall Risk  06/09/2018 04/18/2018 01/11/2017  Falls in the past year? 0 No No  Comment - - Emmi Telephone Survey: data to providers prior to load   Is the patient's home free of loose throw rugs in walkways, pet beds, electrical cords, etc?   yes      Grab bars in the bathroom? no      Handrails on the stairs?   yes      Adequate lighting?   yes  Timed Get Up and Go performed: n/a  Depression Screen PHQ 2/9 Scores 06/09/2018 04/18/2018  PHQ - 2 Score 0 0  PHQ- 9 Score 0 -     Cognitive Function     6CIT Screen 06/09/2018  What Year? 0 points  What month? 0 points  What time? 0 points  Count back from 20 0 points  Months in reverse 0 points  Repeat phrase 0 points  Total Score 0    Immunization History  Administered Date(s) Administered  . Influenza-Unspecified 04/11/2018  . Zoster Recombinat (Shingrix) 04/04/2017, 07/27/2017    Qualifies for Shingles Vaccine? yes  Screening Tests Health Maintenance  Topic Date Due  . Hepatitis C Screening  1950-09-07  . PNA vac Low Risk Adult (1 of 2 - PCV13) 03/02/2014  . MAMMOGRAM  03/15/2020  . COLONOSCOPY  08/10/2022  . TETANUS/TDAP  01/23/2024  . INFLUENZA VACCINE  Completed  . DEXA SCAN  Completed    Cancer  Screenings: Lung: Low Dose CT Chest recommended if Age 33-80 years, 30 pack-year currently smoking OR have quit w/in 15years. Patient does not qualify. Breast:  Up to date on Mammogram? Yes   Up to date of Bone Density/Dexa? Yes Colorectal: up to date  Additional Screenings: : Hepatitis C Screening: due     Plan:    Goal to drink more water.   I have personally reviewed and noted the following in the patient's chart:   . Medical and social history . Use of alcohol, tobacco or illicit drugs  . Current medications and supplements . Functional ability and status . Nutritional status . Physical activity . Advanced directives . List of other physicians . Hospitalizations, surgeries, and ER visits in previous 12 months . Vitals . Screenings to include cognitive, depression, and falls . Referrals and appointments  In addition, I have reviewed and discussed with patient certain preventive protocols, quality metrics, and best practice recommendations. A written personalized care plan for preventive services as well as general preventive health recommendations were provided to patient.     Kellie Simmering, LPN  [id number]

## 2018-06-09 NOTE — Patient Instructions (Signed)
DASH Eating Plan  DASH stands for "Dietary Approaches to Stop Hypertension." The DASH eating plan is a healthy eating plan that has been shown to reduce high blood pressure (hypertension). It may also reduce your risk for type 2 diabetes, heart disease, and stroke. The DASH eating plan may also help with weight loss.  What are tips for following this plan?    General guidelines   Avoid eating more than 2,300 mg (milligrams) of salt (sodium) a day. If you have hypertension, you may need to reduce your sodium intake to 1,500 mg a day.   Limit alcohol intake to no more than 1 drink a day for nonpregnant women and 2 drinks a day for men. One drink equals 12 oz of beer, 5 oz of wine, or 1 oz of hard liquor.   Work with your health care provider to maintain a healthy body weight or to lose weight. Ask what an ideal weight is for you.   Get at least 30 minutes of exercise that causes your heart to beat faster (aerobic exercise) most days of the week. Activities may include walking, swimming, or biking.   Work with your health care provider or diet and nutrition specialist (dietitian) to adjust your eating plan to your individual calorie needs.  Reading food labels     Check food labels for the amount of sodium per serving. Choose foods with less than 5 percent of the Daily Value of sodium. Generally, foods with less than 300 mg of sodium per serving fit into this eating plan.   To find whole grains, look for the word "whole" as the first word in the ingredient list.  Shopping   Buy products labeled as "low-sodium" or "no salt added."   Buy fresh foods. Avoid canned foods and premade or frozen meals.  Cooking   Avoid adding salt when cooking. Use salt-free seasonings or herbs instead of table salt or sea salt. Check with your health care provider or pharmacist before using salt substitutes.   Do not fry foods. Cook foods using healthy methods such as baking, boiling, grilling, and broiling instead.   Cook with  heart-healthy oils, such as olive, canola, soybean, or sunflower oil.  Meal planning   Eat a balanced diet that includes:  ? 5 or more servings of fruits and vegetables each day. At each meal, try to fill half of your plate with fruits and vegetables.  ? Up to 6-8 servings of whole grains each day.  ? Less than 6 oz of lean meat, poultry, or fish each day. A 3-oz serving of meat is about the same size as a deck of cards. One egg equals 1 oz.  ? 2 servings of low-fat dairy each day.  ? A serving of nuts, seeds, or beans 5 times each week.  ? Heart-healthy fats. Healthy fats called Omega-3 fatty acids are found in foods such as flaxseeds and coldwater fish, like sardines, salmon, and mackerel.   Limit how much you eat of the following:  ? Canned or prepackaged foods.  ? Food that is high in trans fat, such as fried foods.  ? Food that is high in saturated fat, such as fatty meat.  ? Sweets, desserts, sugary drinks, and other foods with added sugar.  ? Full-fat dairy products.   Do not salt foods before eating.   Try to eat at least 2 vegetarian meals each week.   Eat more home-cooked food and less restaurant, buffet, and fast food.     When eating at a restaurant, ask that your food be prepared with less salt or no salt, if possible.  What foods are recommended?  The items listed may not be a complete list. Talk with your dietitian about what dietary choices are best for you.  Grains  Whole-grain or whole-wheat bread. Whole-grain or whole-wheat pasta. Brown rice. Oatmeal. Quinoa. Bulgur. Whole-grain and low-sodium cereals. Pita bread. Low-fat, low-sodium crackers. Whole-wheat flour tortillas.  Vegetables  Fresh or frozen vegetables (raw, steamed, roasted, or grilled). Low-sodium or reduced-sodium tomato and vegetable juice. Low-sodium or reduced-sodium tomato sauce and tomato paste. Low-sodium or reduced-sodium canned vegetables.  Fruits  All fresh, dried, or frozen fruit. Canned fruit in natural juice (without  added sugar).  Meat and other protein foods  Skinless chicken or turkey. Ground chicken or turkey. Pork with fat trimmed off. Fish and seafood. Egg whites. Dried beans, peas, or lentils. Unsalted nuts, nut butters, and seeds. Unsalted canned beans. Lean cuts of beef with fat trimmed off. Low-sodium, lean deli meat.  Dairy  Low-fat (1%) or fat-free (skim) milk. Fat-free, low-fat, or reduced-fat cheeses. Nonfat, low-sodium ricotta or cottage cheese. Low-fat or nonfat yogurt. Low-fat, low-sodium cheese.  Fats and oils  Soft margarine without trans fats. Vegetable oil. Low-fat, reduced-fat, or light mayonnaise and salad dressings (reduced-sodium). Canola, safflower, olive, soybean, and sunflower oils. Avocado.  Seasoning and other foods  Herbs. Spices. Seasoning mixes without salt. Unsalted popcorn and pretzels. Fat-free sweets.  What foods are not recommended?  The items listed may not be a complete list. Talk with your dietitian about what dietary choices are best for you.  Grains  Baked goods made with fat, such as croissants, muffins, or some breads. Dry pasta or rice meal packs.  Vegetables  Creamed or fried vegetables. Vegetables in a cheese sauce. Regular canned vegetables (not low-sodium or reduced-sodium). Regular canned tomato sauce and paste (not low-sodium or reduced-sodium). Regular tomato and vegetable juice (not low-sodium or reduced-sodium). Pickles. Olives.  Fruits  Canned fruit in a light or heavy syrup. Fried fruit. Fruit in cream or butter sauce.  Meat and other protein foods  Fatty cuts of meat. Ribs. Fried meat. Bacon. Sausage. Bologna and other processed lunch meats. Salami. Fatback. Hotdogs. Bratwurst. Salted nuts and seeds. Canned beans with added salt. Canned or smoked fish. Whole eggs or egg yolks. Chicken or turkey with skin.  Dairy  Whole or 2% milk, cream, and half-and-half. Whole or full-fat cream cheese. Whole-fat or sweetened yogurt. Full-fat cheese. Nondairy creamers. Whipped toppings.  Processed cheese and cheese spreads.  Fats and oils  Butter. Stick margarine. Lard. Shortening. Ghee. Bacon fat. Tropical oils, such as coconut, palm kernel, or palm oil.  Seasoning and other foods  Salted popcorn and pretzels. Onion salt, garlic salt, seasoned salt, table salt, and sea salt. Worcestershire sauce. Tartar sauce. Barbecue sauce. Teriyaki sauce. Soy sauce, including reduced-sodium. Steak sauce. Canned and packaged gravies. Fish sauce. Oyster sauce. Cocktail sauce. Horseradish that you find on the shelf. Ketchup. Mustard. Meat flavorings and tenderizers. Bouillon cubes. Hot sauce and Tabasco sauce. Premade or packaged marinades. Premade or packaged taco seasonings. Relishes. Regular salad dressings.  Where to find more information:   National Heart, Lung, and Blood Institute: www.nhlbi.nih.gov   American Heart Association: www.heart.org  Summary   The DASH eating plan is a healthy eating plan that has been shown to reduce high blood pressure (hypertension). It may also reduce your risk for type 2 diabetes, heart disease, and stroke.   With the   DASH eating plan, you should limit salt (sodium) intake to 2,300 mg a day. If you have hypertension, you may need to reduce your sodium intake to 1,500 mg a day.   When on the DASH eating plan, aim to eat more fresh fruits and vegetables, whole grains, lean proteins, low-fat dairy, and heart-healthy fats.   Work with your health care provider or diet and nutrition specialist (dietitian) to adjust your eating plan to your individual calorie needs.  This information is not intended to replace advice given to you by your health care provider. Make sure you discuss any questions you have with your health care provider.  Document Released: 05/28/2011 Document Revised: 06/01/2016 Document Reviewed: 06/01/2016  [employer]vier Interactive Patient Education  2019 [employer]

## 2018-06-09 NOTE — Patient Instructions (Signed)
Maria Hall , Thank you for taking time to come for your Medicare Wellness Visit. I appreciate your ongoing commitment to your health goals. Please review the following plan we discussed and let me know if I can assist you in the future.   Screening recommendations/referrals: Colonoscopy: 07/2017 Mammogram: 02/2018 Bone Density: 05/2016 Recommended yearly ophthalmology/optometry visit for glaucoma screening and checkup Recommended yearly dental visit for hygiene and checkup  Vaccinations: Influenza vaccine: 10/20119 Pneumococcal vaccine: 06/2014 Tdap vaccine: 01/2014 Shingles vaccine: 07/2017  Advanced directives: Please bring a copy of your POA (Power of [place]) and/or Living Will to your next appointment.    Conditions/risks identified: Overweight  Next appointment:    Preventive Care 5 Years and Older, Female Preventive care refers to lifestyle choices and visits with your health care provider that can promote health and wellness. What does preventive care include?  A yearly physical exam. This is also called an annual well check.  Dental exams once or twice a year.  Routine eye exams. Ask your health care provider how often you should have your eyes checked.  Personal lifestyle choices, including:  Daily care of your teeth and gums.  Regular physical activity.  Eating a healthy diet.  Avoiding tobacco and drug use.  Limiting alcohol use.  Practicing safe sex.  Taking low-dose aspirin every day.  Taking vitamin and mineral supplements as recommended by your health care provider. What happens during an annual well check? The services and screenings done by your health care provider during your annual well check will depend on your age, overall health, lifestyle risk factors, and family history of disease. Counseling  Your health care provider may ask you questions about your:  Alcohol use.  Tobacco use.  Drug use.  Emotional well-being.  Home and  relationship well-being.  Sexual activity.  Eating habits.  History of falls.  Memory and ability to understand (cognition).  Work and work Statistician.  Reproductive health. Screening  You may have the following tests or measurements:  Height, weight, and BMI.  Blood pressure.  Lipid and cholesterol levels. These may be checked every 5 years, or more frequently if you are over 8 years old.  Skin check.  Lung cancer screening. You may have this screening every year starting at age 6 if you have a 30-pack-year history of smoking and currently smoke or have quit within the past 15 years.  Fecal occult blood test (FOBT) of the stool. You may have this test every year starting at age 42.  Flexible sigmoidoscopy or colonoscopy. You may have a sigmoidoscopy every 5 years or a colonoscopy every 10 years starting at age 17.  Hepatitis C blood test.  Hepatitis B blood test.  Sexually transmitted disease (STD) testing.  Diabetes screening. This is done by checking your blood sugar (glucose) after you have not eaten for a while (fasting). You may have this done every 1-3 years.  Bone density scan. This is done to screen for osteoporosis. You may have this done starting at age 54.  Mammogram. This may be done every 1-2 years. Talk to your health care provider about how often you should have regular mammograms. Talk with your health care provider about your test results, treatment options, and if necessary, the need for more tests. Vaccines  Your health care provider may recommend certain vaccines, such as:  Influenza vaccine. This is recommended every year.  Tetanus, diphtheria, and acellular pertussis (Tdap, Td) vaccine. You may need a Td booster every 10 years.  Zoster vaccine. You may need this after age 70.  Pneumococcal 13-valent conjugate (PCV13) vaccine. One dose is recommended after age 52.  Pneumococcal polysaccharide (PPSV23) vaccine. One dose is recommended after  age 40. Talk to your health care provider about which screenings and vaccines you need and how often you need them. This information is not intended to replace advice given to you by your health care provider. Make sure you discuss any questions you have with your health care provider. Document Released: 07/05/2015 Document Revised: 02/26/2016 Document Reviewed: 04/09/2015 Elsevier Interactive Patient Education  2017 [place] Prevention in the Home Falls can cause injuries. They can happen to people of all ages. There are many things you can do to make your home safe and to help prevent falls. What can I do on the outside of my home?  Regularly fix the edges of walkways and driveways and fix any cracks.  Remove anything that might make you trip as you walk through a door, such as a raised step or threshold.  Trim any bushes or trees on the path to your home.  Use bright outdoor lighting.  Clear any walking paths of anything that might make someone trip, such as rocks or tools.  Regularly check to see if handrails are loose or broken. Make sure that both sides of any steps have handrails.  Any raised decks and porches should have guardrails on the edges.  Have any leaves, snow, or ice cleared regularly.  Use sand or salt on walking paths during winter.  Clean up any spills in your garage right away. This includes oil or grease spills. What can I do in the bathroom?  Use night lights.  Install grab bars by the toilet and in the tub and shower. Do not use towel bars as grab bars.  Use non-skid mats or decals in the tub or shower.  If you need to sit down in the shower, use a plastic, non-slip stool.  Keep the floor dry. Clean up any water that spills on the floor as soon as it happens.  Remove soap buildup in the tub or shower regularly.  Attach bath mats securely with double-sided non-slip rug tape.  Do not have throw rugs and other things on the floor that can  make you trip. What can I do in the bedroom?  Use night lights.  Make sure that you have a light by your bed that is easy to reach.  Do not use any sheets or blankets that are too big for your bed. They should not hang down onto the floor.  Have a firm chair that has side arms. You can use this for support while you get dressed.  Do not have throw rugs and other things on the floor that can make you trip. What can I do in the kitchen?  Clean up any spills right away.  Avoid walking on wet floors.  Keep items that you use a lot in easy-to-reach places.  If you need to reach something above you, use a strong step stool that has a grab bar.  Keep electrical cords out of the way.  Do not use floor polish or wax that makes floors slippery. If you must use wax, use non-skid floor wax.  Do not have throw rugs and other things on the floor that can make you trip. What can I do with my stairs?  Do not leave any items on the stairs.  Make sure that there are  handrails on both sides of the stairs and use them. Fix handrails that are broken or loose. Make sure that handrails are as long as the stairways.  Check any carpeting to make sure that it is firmly attached to the stairs. Fix any carpet that is loose or worn.  Avoid having throw rugs at the top or bottom of the stairs. If you do have throw rugs, attach them to the floor with carpet tape.  Make sure that you have a light switch at the top of the stairs and the bottom of the stairs. If you do not have them, ask someone to add them for you. What else can I do to help prevent falls?  Wear shoes that:  Do not have high heels.  Have rubber bottoms.  Are comfortable and fit you well.  Are closed at the toe. Do not wear sandals.  If you use a stepladder:  Make sure that it is fully opened. Do not climb a closed stepladder.  Make sure that both sides of the stepladder are locked into place.  Ask someone to hold it for you,  if possible.  Clearly mark and make sure that you can see:  Any grab bars or handrails.  First and last steps.  Where the edge of each step is.  Use tools that help you move around (mobility aids) if they are needed. These include:  Canes.  Walkers.  Scooters.  Crutches.  Turn on the lights when you go into a dark area. Replace any light bulbs as soon as they burn out.  Set up your furniture so you have a clear path. Avoid moving your furniture around.  If any of your floors are uneven, fix them.  If there are any pets around you, be aware of where they are.  Review your medicines with your doctor. Some medicines can make you feel dizzy. This can increase your chance of falling. Ask your doctor what other things that you can do to help prevent falls. This information is not intended to replace advice given to you by your health care provider. Make sure you discuss any questions you have with your health care provider. Document Released: 04/04/2009 Document Revised: 11/14/2015 Document Reviewed: 07/13/2014 Elsevier Interactive Patient Education  2017 [employer].

## 2018-06-10 LAB — CMP14+EGFR
ALT: 8 IU/L (ref 0–32)
AST: 18 IU/L (ref 0–40)
Albumin/Globulin Ratio: 1.4 (ref 1.2–2.2)
Albumin: 3.9 g/dL (ref 3.6–4.8)
Alkaline Phosphatase: 133 IU/L — ABNORMAL HIGH (ref 39–117)
BUN/Creatinine Ratio: 9 — ABNORMAL LOW (ref 12–28)
BUN: 8 mg/dL (ref 8–27)
Bilirubin Total: 0.2 mg/dL (ref 0.0–1.2)
CO2: 25 mmol/L (ref 20–29)
Calcium: 10 mg/dL (ref 8.7–10.3)
Chloride: 103 mmol/L (ref 96–106)
Creatinine, Ser: 0.88 mg/dL (ref 0.57–1.00)
GFR calc Af Amer: 78 mL/min/{1.73_m2} (ref >59)
GFR calc non Af Amer: 67 mL/min/{1.73_m2} (ref >59)
Globulin, Total: 2.7 g/dL (ref 1.5–4.5)
Glucose: 96 mg/dL (ref 65–99)
Potassium: 4.1 mmol/L (ref 3.5–5.2)
Sodium: 145 mmol/L — ABNORMAL HIGH (ref 134–144)
Total Protein: 6.6 g/dL (ref 6.0–8.5)

## 2018-06-10 LAB — HEPATITIS C ANTIBODY: Hep C Virus Ab: <0.1 s/co ratio (ref 0.0–0.9)

## 2018-06-10 LAB — T4, FREE: Free T4: 1.01 ng/dL (ref 0.82–1.77)

## 2018-06-10 LAB — TSH: TSH: 0.975 u[IU]/mL (ref 0.450–4.500)

## 2018-06-13 ENCOUNTER — Ambulatory Visit: Payer: Self-pay | Admitting: Internal Medicine

## 2018-06-17 ENCOUNTER — Other Ambulatory Visit: Payer: Self-pay | Admitting: Internal Medicine

## 2018-06-19 NOTE — Progress Notes (Signed)
Subjective:     Patient ID: Maria Hall , female    DOB: [birth year] , 67 y.o.   MRN: [id number]   Chief Complaint  Patient presents with  . Thyroid Problem  . Hypertension    HPI  Thyroid Problem  Presents for follow-up visit. Patient reports no cold intolerance, constipation, depressed mood or palpitations. The symptoms have been stable.  Hypertension  This is a chronic problem. The current episode started more than 1 year ago. The problem has been gradually improving since onset. The problem is controlled. Pertinent negatives include no blurred vision, chest pain, headaches, palpitations or shortness of breath. Risk factors for coronary artery disease include post-menopausal state and sedentary lifestyle. Identifiable causes of hypertension include a thyroid problem.   She reports compliance with meds.   Past Medical History:  Diagnosis Date  . 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 Bronchitis    gets every year in the winter  . Insomnia    takes meds  . Right rotator cuff tear   . Swelling    in hands and ankles  . Thyroid disease      Family History  Adopted: Yes     Current Outpatient Medications:  .  acetaminophen (ARTHRITIS PAIN) 650 MG CR tablet, Take 650 mg by mouth. Take 2 caplets daily, Disp: , Rfl:  .  Ascorbic Acid (VITAMIN C) 1000 MG tablet, Take 1,000 mg by mouth daily., Disp: , Rfl:  .  aspirin EC 81 MG tablet, Take 81 mg by mouth daily., Disp: , Rfl:  .  cholecalciferol (VITAMIN D) 1000 units tablet, Take 1,000 Units by mouth daily., Disp: , Rfl:  .  Docusate Calcium (STOOL SOFTENER PO), Take 100 mg by mouth. Take 2 soft gels daily, Disp: , Rfl:  .  fluticasone (FLONASE) 50 MCG/ACT nasal spray, SPRAY 1 SPRAY INTO EACH NOSTRIL EVERY DAY, Disp: 16 g, Rfl: 1 .  folic acid (FOLVITE) 1 MG tablet, TAKE 1 TABLET BY MOUTH EVERY DAY, Disp: 90 tablet, Rfl: 3 .  hydrochlorothiazide (HYDRODIURIL) 12.5 MG tablet, TAKE 1 TABLET BY MOUTH EVERY DAY, Disp: 90  tablet, Rfl: 3 .  hydrochlorothiazide (MICROZIDE) 12.5 MG capsule, Take 12.5 mg by mouth daily., Disp: , Rfl:  .  HYDROcodone-homatropine (HYDROMET) 5-1.5 MG/5ML syrup, Take 5 mLs by mouth every 6 (six) hours as needed for cough. (Patient not taking: Reported on 06/09/2018), Disp: 120 mL, Rfl: 0 .  levothyroxine (SYNTHROID, LEVOTHROID) 88 MCG tablet, TAKE 1 TABLET (88 MCG TOTAL) BY MOUTH DAILY BEFORE BREAKFAST. MON - SAT ONLY, Disp: 30 tablet, Rfl: 1 .  methocarbamol (ROBAXIN) 500 MG tablet, Take 1 tablet (500 mg total) by mouth 4 (four) times daily. (Patient not taking: Reported on 06/09/2018), Disp: 20 tablet, Rfl: 0 .  Omega 3-6-9 Fatty Acids (OMEGA 3-6-9 COMPLEX PO), Take 1,600 mg by mouth daily at 6 (six) AM., Disp: , Rfl:  .  predniSONE (DELTASONE) 20 MG tablet, Take 1 tablet (20 mg total) by mouth daily with breakfast. (Patient not taking: Reported on 06/09/2018), Disp: 10 tablet, Rfl: 0 .  traZODone (DESYREL) 100 MG tablet, TAKE 1 TABLET BY MOUTH EVERYDAY AT BEDTIME, Disp: 90 tablet, Rfl: 1 .  traZODone (DESYREL) 50 MG tablet, TAKE 1 TABLET BY MOUTH EVERYDAY AT BEDTIME (Patient not taking: Reported on 06/09/2018), Disp: 90 tablet, Rfl: 1 .  vitamin E 400 UNIT capsule, Take 400 Units by mouth daily., Disp: , Rfl:   Current Facility-Administered Medications:  .  0.9 %  sodium chloride infusion, 500 mL, Intravenous, Once, Armbruster, Carlota Raspberry, MD   No Known Allergies   Review of Systems  Constitutional: Negative.   Eyes: Negative for blurred vision.  Respiratory: Negative.  Negative for shortness of breath.   Cardiovascular: Negative.  Negative for chest pain and palpitations.  Gastrointestinal: Negative.  Negative for constipation.  Endocrine: Negative for cold intolerance.  Neurological: Negative.  Negative for headaches.  Psychiatric/Behavioral: Negative.      Today's Vitals   06/09/18 1526  BP: 118/64  Pulse: (!) 58  Temp: 98.6 F (37 C)  TempSrc: Oral  Weight: 156 lb 6.4  oz (70.9 kg)  Height: 5' 5.4" (1.661 m)  PainSc: 0-No pain   Body mass index is 25.71 kg/m.   Objective:  Physical Exam Vitals signs and nursing note reviewed.  Constitutional:      Appearance: Normal appearance.  Neck:     Musculoskeletal: Normal range of motion.  Cardiovascular:     Rate and Rhythm: Normal rate and regular rhythm.     Heart sounds: Normal heart sounds.  Pulmonary:     Effort: Pulmonary effort is normal.     Breath sounds: Normal breath sounds.  Skin:    General: Skin is warm.  Neurological:     General: No focal deficit present.     Mental Status: She is alert.  Psychiatric:        Mood and Affect: Mood normal.         Assessment And Plan:     1. Primary hypothyroidism  I will check thyroid panel and adjust meds as needed.   - TSH - T4, Free - Hepatitis C antibody  2. Essential hypertension, benign  Well controlled. She will continue with current meds. She is encouraged to avoid adding salt to her foods.   - CMP14+EGFR - POCT Urinalysis Dipstick (81002) - POCT UA - Microalbumin  3. Sickle-cell trait (HCC)  Chronic.  She is encouraged to stay well hydrated.   4. Other abnormal glucose  HER A1C HAS BEEN ELEVATED IN THE PAST. I WILL CHECK AN A1C, BMET TODAY. SHE WAS ENCOURAGED TO AVOID SUGARY BEVERAGES AND PROCESSED FOODS INCLUDNG BREADS, RICE AND PASTA.  - Hemoglobin A1c   Maximino Greenland, MD

## 2018-07-13 ENCOUNTER — Other Ambulatory Visit: Payer: Self-pay | Admitting: Internal Medicine

## 2018-07-22 ENCOUNTER — Other Ambulatory Visit: Payer: Self-pay | Admitting: Internal Medicine

## 2018-08-10 ENCOUNTER — Telehealth: Payer: Self-pay

## 2018-08-10 NOTE — Telephone Encounter (Signed)
Patient called requesting a prescription for benadryl due to her having bites on her legs. Patient advised that she can buy benadryl over the counter and if that still doesn't work then she can give [place] a call back to schedule an appointment per Minette Brine FNP-BC. YRL,RMA

## 2018-09-14 ENCOUNTER — Other Ambulatory Visit: Payer: Self-pay | Admitting: Internal Medicine

## 2018-09-27 ENCOUNTER — Other Ambulatory Visit: Payer: Self-pay | Admitting: Internal Medicine

## 2018-10-26 DIAGNOSIS — Z20828 Contact with and (suspected) exposure to other viral communicable diseases: Secondary | ICD-10-CM | POA: Diagnosis not present

## 2018-11-21 DIAGNOSIS — H5203 Hypermetropia, bilateral: Secondary | ICD-10-CM | POA: Diagnosis not present

## 2018-11-21 DIAGNOSIS — H2513 Age-related nuclear cataract, bilateral: Secondary | ICD-10-CM | POA: Diagnosis not present

## 2018-11-21 DIAGNOSIS — H1045 Other chronic allergic conjunctivitis: Secondary | ICD-10-CM | POA: Diagnosis not present

## 2018-11-21 DIAGNOSIS — H52203 Unspecified astigmatism, bilateral: Secondary | ICD-10-CM | POA: Diagnosis not present

## 2018-11-28 ENCOUNTER — Other Ambulatory Visit: Payer: Self-pay | Admitting: Internal Medicine

## 2018-11-29 NOTE — Telephone Encounter (Signed)
Trazodone refill 

## 2018-12-12 ENCOUNTER — Encounter: Payer: Self-pay | Admitting: Internal Medicine

## 2018-12-12 ENCOUNTER — Ambulatory Visit (INDEPENDENT_AMBULATORY_CARE_PROVIDER_SITE_OTHER): Payer: Medicare Other | Admitting: Internal Medicine

## 2018-12-12 ENCOUNTER — Other Ambulatory Visit: Payer: Self-pay

## 2018-12-12 VITALS — BP 110/66 | HR 58 | Temp 97.7°F | Ht 64.4 in | Wt 159.0 lb

## 2018-12-12 DIAGNOSIS — I10 Essential (primary) hypertension: Secondary | ICD-10-CM

## 2018-12-12 DIAGNOSIS — J301 Allergic rhinitis due to pollen: Secondary | ICD-10-CM

## 2018-12-12 DIAGNOSIS — D573 Sickle-cell trait: Secondary | ICD-10-CM | POA: Diagnosis not present

## 2018-12-12 DIAGNOSIS — E039 Hypothyroidism, unspecified: Secondary | ICD-10-CM | POA: Diagnosis not present

## 2018-12-12 MED ORDER — FLUTICASONE PROPIONATE 50 MCG/ACT NA SUSP: NASAL | 3 refills | Status: DC

## 2018-12-12 NOTE — Progress Notes (Signed)
Subjective:     Patient ID: Maria Hall , female    DOB: [birth year] , 68 y.o.   MRN: [id number]   Chief Complaint  Patient presents with  . Bp check  . Hypothyroidism    HPI  Hypertension This is a chronic problem. The current episode started more than 1 year ago. The problem has been gradually improving since onset. The problem is controlled. Pertinent negatives include no blurred vision, chest pain, palpitations, peripheral edema or shortness of breath. Past treatments include diuretics. The current treatment provides moderate improvement. There are no compliance problems.  Identifiable causes of hypertension include a thyroid problem.  Thyroid Problem Presents for follow-up visit. Patient reports no palpitations. The symptoms have been stable.     Past Medical History:  Diagnosis Date  . 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 Bronchitis    gets every year in the winter  . Insomnia    takes meds  . Right rotator cuff tear   . Swelling    in hands and ankles  . Thyroid disease      Family History  Adopted: Yes     Current Outpatient Medications:  .  acetaminophen (ARTHRITIS PAIN) 650 MG CR tablet, Take 650 mg by mouth. Take 2 caplets daily, Disp: , Rfl:  .  Ascorbic Acid (VITAMIN C) 1000 MG tablet, Take 1,000 mg by mouth daily., Disp: , Rfl:  .  aspirin EC 81 MG tablet, Take 81 mg by mouth daily., Disp: , Rfl:  .  cholecalciferol (VITAMIN D) 1000 units tablet, Take 1,000 Units by mouth daily., Disp: , Rfl:  .  Docusate Calcium (STOOL SOFTENER PO), Take 100 mg by mouth. Take 2 soft gels daily, Disp: , Rfl:  .  fluticasone (FLONASE) 50 MCG/ACT nasal spray, SPRAY 1 SPRAY INTO EACH NOSTRIL EVERY DAY, Disp: 16 g, Rfl: 3 .  folic acid (FOLVITE) 1 MG tablet, TAKE 1 TABLET BY MOUTH EVERY DAY, Disp: 90 tablet, Rfl: 3 .  hydrochlorothiazide (HYDRODIURIL) 12.5 MG tablet, TAKE 1 TABLET BY MOUTH EVERY DAY, Disp: 90 tablet, Rfl: 3 .  levothyroxine (SYNTHROID, LEVOTHROID) 88  MCG tablet, TAKE 1 TABLET (88 MCG TOTAL) BY MOUTH DAILY BEFORE BREAKFAST. MON - SAT ONLY, Disp: 26 tablet, Rfl: 2 .  Omega 3-6-9 Fatty Acids (OMEGA 3-6-9 COMPLEX PO), Take 1,600 mg by mouth daily at 6 (six) AM., Disp: , Rfl:  .  traZODone (DESYREL) 100 MG tablet, TAKE 1 TABLET BY MOUTH EVERYDAY AT BEDTIME, Disp: 90 tablet, Rfl: 1 .  vitamin E 400 UNIT capsule, Take 400 Units by mouth daily., Disp: , Rfl:    No Known Allergies   Review of Systems  Constitutional: Negative.   HENT: Positive for congestion.        Needs refill on flonase.   Eyes: Negative for blurred vision.  Respiratory: Negative.  Negative for shortness of breath.   Cardiovascular: Negative.  Negative for chest pain and palpitations.  Gastrointestinal: Negative.   Neurological: Negative.   Psychiatric/Behavioral: Negative.      Today's Vitals   12/12/18 0932  BP: 110/66  Pulse: (!) 58  Temp: 97.7 F (36.5 C)  TempSrc: Oral  Weight: 159 lb (72.1 kg)  Height: 5' 4.4" (1.636 m)   Body mass index is 26.95 kg/m.   Objective:  Physical Exam Vitals signs and nursing note reviewed.  Constitutional:      Appearance: Normal appearance.  HENT:     Head: Normocephalic and atraumatic.  Cardiovascular:     Rate and Rhythm: Normal rate and regular rhythm.     Heart sounds: Normal heart sounds.  Pulmonary:     Effort: Pulmonary effort is normal.     Breath sounds: Normal breath sounds.  Skin:    General: Skin is warm.  Neurological:     General: No focal deficit present.     Mental Status: She is alert.  Psychiatric:        Mood and Affect: Mood normal.        Behavior: Behavior normal.         Assessment And Plan:     1. Essential hypertension, benign  Well controlled. She will continue with current meds. She is encouraged to avoid adding salt to her foods. Also encouraged to strive for five days of exercise per week. She will rto in six months for her next AWV.   - CMP14+EGFR  2. Primary  hypothyroidism  I will check thyroid panel and adjust meds as needed.  - TSH - T4, Free  3. Seasonal allergic rhinitis due to pollen  Chronic. She was given refill of flonase to use prn.   4. Sickle-cell trait (HCC)  Chronic, yet stable.   Maximino Greenland, MD    THE PATIENT IS ENCOURAGED TO PRACTICE SOCIAL DISTANCING DUE TO THE COVID-19 PANDEMIC.

## 2018-12-13 ENCOUNTER — Other Ambulatory Visit: Payer: Self-pay | Admitting: Internal Medicine

## 2018-12-13 LAB — CMP14+EGFR
ALT: 10 IU/L (ref 0–32)
AST: 19 IU/L (ref 0–40)
Albumin/Globulin Ratio: 1.7 (ref 1.2–2.2)
Albumin: 4 g/dL (ref 3.8–4.8)
Alkaline Phosphatase: 122 IU/L — ABNORMAL HIGH (ref 39–117)
BUN/Creatinine Ratio: 9 — ABNORMAL LOW (ref 12–28)
BUN: 8 mg/dL (ref 8–27)
Bilirubin Total: 0.6 mg/dL (ref 0.0–1.2)
CO2: 27 mmol/L (ref 20–29)
Calcium: 10.6 mg/dL — ABNORMAL HIGH (ref 8.7–10.3)
Chloride: 99 mmol/L (ref 96–106)
Creatinine, Ser: 0.88 mg/dL (ref 0.57–1.00)
GFR calc Af Amer: 78 mL/min/{1.73_m2} (ref >59)
GFR calc non Af Amer: 67 mL/min/{1.73_m2} (ref >59)
Globulin, Total: 2.3 g/dL (ref 1.5–4.5)
Glucose: 91 mg/dL (ref 65–99)
Potassium: 4.1 mmol/L (ref 3.5–5.2)
Sodium: 140 mmol/L (ref 134–144)
Total Protein: 6.3 g/dL (ref 6.0–8.5)

## 2018-12-13 LAB — TSH: TSH: 0.824 u[IU]/mL (ref 0.450–4.500)

## 2018-12-13 LAB — T4, FREE: Free T4: 1.12 ng/dL (ref 0.82–1.77)

## 2018-12-19 ENCOUNTER — Other Ambulatory Visit: Payer: Self-pay | Admitting: Internal Medicine

## 2019-01-20 ENCOUNTER — Other Ambulatory Visit: Payer: Self-pay | Admitting: Hematology & Oncology

## 2019-01-20 DIAGNOSIS — D473 Essential (hemorrhagic) thrombocythemia: Secondary | ICD-10-CM

## 2019-01-20 DIAGNOSIS — D573 Sickle-cell trait: Secondary | ICD-10-CM

## 2019-02-01 DIAGNOSIS — M546 Pain in thoracic spine: Secondary | ICD-10-CM | POA: Diagnosis not present

## 2019-02-01 DIAGNOSIS — M9902 Segmental and somatic dysfunction of thoracic region: Secondary | ICD-10-CM | POA: Diagnosis not present

## 2019-02-01 DIAGNOSIS — M47814 Spondylosis without myelopathy or radiculopathy, thoracic region: Secondary | ICD-10-CM | POA: Diagnosis not present

## 2019-02-01 DIAGNOSIS — M9903 Segmental and somatic dysfunction of lumbar region: Secondary | ICD-10-CM | POA: Diagnosis not present

## 2019-02-02 DIAGNOSIS — M546 Pain in thoracic spine: Secondary | ICD-10-CM | POA: Diagnosis not present

## 2019-02-02 DIAGNOSIS — M9902 Segmental and somatic dysfunction of thoracic region: Secondary | ICD-10-CM | POA: Diagnosis not present

## 2019-02-02 DIAGNOSIS — M47814 Spondylosis without myelopathy or radiculopathy, thoracic region: Secondary | ICD-10-CM | POA: Diagnosis not present

## 2019-02-02 DIAGNOSIS — M9903 Segmental and somatic dysfunction of lumbar region: Secondary | ICD-10-CM | POA: Diagnosis not present

## 2019-02-04 ENCOUNTER — Other Ambulatory Visit: Payer: Self-pay | Admitting: Internal Medicine

## 2019-02-06 DIAGNOSIS — M546 Pain in thoracic spine: Secondary | ICD-10-CM | POA: Diagnosis not present

## 2019-02-06 DIAGNOSIS — M9902 Segmental and somatic dysfunction of thoracic region: Secondary | ICD-10-CM | POA: Diagnosis not present

## 2019-02-06 DIAGNOSIS — M47814 Spondylosis without myelopathy or radiculopathy, thoracic region: Secondary | ICD-10-CM | POA: Diagnosis not present

## 2019-02-06 DIAGNOSIS — M9903 Segmental and somatic dysfunction of lumbar region: Secondary | ICD-10-CM | POA: Diagnosis not present

## 2019-02-09 DIAGNOSIS — M9903 Segmental and somatic dysfunction of lumbar region: Secondary | ICD-10-CM | POA: Diagnosis not present

## 2019-02-09 DIAGNOSIS — M47814 Spondylosis without myelopathy or radiculopathy, thoracic region: Secondary | ICD-10-CM | POA: Diagnosis not present

## 2019-02-09 DIAGNOSIS — M546 Pain in thoracic spine: Secondary | ICD-10-CM | POA: Diagnosis not present

## 2019-02-09 DIAGNOSIS — M9902 Segmental and somatic dysfunction of thoracic region: Secondary | ICD-10-CM | POA: Diagnosis not present

## 2019-02-13 DIAGNOSIS — M47814 Spondylosis without myelopathy or radiculopathy, thoracic region: Secondary | ICD-10-CM | POA: Diagnosis not present

## 2019-02-13 DIAGNOSIS — M9903 Segmental and somatic dysfunction of lumbar region: Secondary | ICD-10-CM | POA: Diagnosis not present

## 2019-02-13 DIAGNOSIS — M9902 Segmental and somatic dysfunction of thoracic region: Secondary | ICD-10-CM | POA: Diagnosis not present

## 2019-02-13 DIAGNOSIS — M546 Pain in thoracic spine: Secondary | ICD-10-CM | POA: Diagnosis not present

## 2019-02-16 DIAGNOSIS — M9902 Segmental and somatic dysfunction of thoracic region: Secondary | ICD-10-CM | POA: Diagnosis not present

## 2019-02-16 DIAGNOSIS — M9903 Segmental and somatic dysfunction of lumbar region: Secondary | ICD-10-CM | POA: Diagnosis not present

## 2019-02-16 DIAGNOSIS — M546 Pain in thoracic spine: Secondary | ICD-10-CM | POA: Diagnosis not present

## 2019-02-16 DIAGNOSIS — M47814 Spondylosis without myelopathy or radiculopathy, thoracic region: Secondary | ICD-10-CM | POA: Diagnosis not present

## 2019-02-20 DIAGNOSIS — M546 Pain in thoracic spine: Secondary | ICD-10-CM | POA: Diagnosis not present

## 2019-02-20 DIAGNOSIS — M9903 Segmental and somatic dysfunction of lumbar region: Secondary | ICD-10-CM | POA: Diagnosis not present

## 2019-02-20 DIAGNOSIS — M47814 Spondylosis without myelopathy or radiculopathy, thoracic region: Secondary | ICD-10-CM | POA: Diagnosis not present

## 2019-02-20 DIAGNOSIS — M9902 Segmental and somatic dysfunction of thoracic region: Secondary | ICD-10-CM | POA: Diagnosis not present

## 2019-02-23 DIAGNOSIS — M9903 Segmental and somatic dysfunction of lumbar region: Secondary | ICD-10-CM | POA: Diagnosis not present

## 2019-02-23 DIAGNOSIS — M9902 Segmental and somatic dysfunction of thoracic region: Secondary | ICD-10-CM | POA: Diagnosis not present

## 2019-02-23 DIAGNOSIS — M546 Pain in thoracic spine: Secondary | ICD-10-CM | POA: Diagnosis not present

## 2019-02-23 DIAGNOSIS — M47814 Spondylosis without myelopathy or radiculopathy, thoracic region: Secondary | ICD-10-CM | POA: Diagnosis not present

## 2019-03-02 DIAGNOSIS — M9902 Segmental and somatic dysfunction of thoracic region: Secondary | ICD-10-CM | POA: Diagnosis not present

## 2019-03-02 DIAGNOSIS — M9903 Segmental and somatic dysfunction of lumbar region: Secondary | ICD-10-CM | POA: Diagnosis not present

## 2019-03-02 DIAGNOSIS — M47814 Spondylosis without myelopathy or radiculopathy, thoracic region: Secondary | ICD-10-CM | POA: Diagnosis not present

## 2019-03-02 DIAGNOSIS — M546 Pain in thoracic spine: Secondary | ICD-10-CM | POA: Diagnosis not present

## 2019-03-08 DIAGNOSIS — M9902 Segmental and somatic dysfunction of thoracic region: Secondary | ICD-10-CM | POA: Diagnosis not present

## 2019-03-08 DIAGNOSIS — M9903 Segmental and somatic dysfunction of lumbar region: Secondary | ICD-10-CM | POA: Diagnosis not present

## 2019-03-08 DIAGNOSIS — M47814 Spondylosis without myelopathy or radiculopathy, thoracic region: Secondary | ICD-10-CM | POA: Diagnosis not present

## 2019-03-08 DIAGNOSIS — M546 Pain in thoracic spine: Secondary | ICD-10-CM | POA: Diagnosis not present

## 2019-03-16 DIAGNOSIS — M47814 Spondylosis without myelopathy or radiculopathy, thoracic region: Secondary | ICD-10-CM | POA: Diagnosis not present

## 2019-03-16 DIAGNOSIS — M546 Pain in thoracic spine: Secondary | ICD-10-CM | POA: Diagnosis not present

## 2019-03-16 DIAGNOSIS — M9903 Segmental and somatic dysfunction of lumbar region: Secondary | ICD-10-CM | POA: Diagnosis not present

## 2019-03-16 DIAGNOSIS — M9902 Segmental and somatic dysfunction of thoracic region: Secondary | ICD-10-CM | POA: Diagnosis not present

## 2019-03-20 DIAGNOSIS — M546 Pain in thoracic spine: Secondary | ICD-10-CM | POA: Diagnosis not present

## 2019-03-20 DIAGNOSIS — M9902 Segmental and somatic dysfunction of thoracic region: Secondary | ICD-10-CM | POA: Diagnosis not present

## 2019-03-20 DIAGNOSIS — M9903 Segmental and somatic dysfunction of lumbar region: Secondary | ICD-10-CM | POA: Diagnosis not present

## 2019-03-20 DIAGNOSIS — M47814 Spondylosis without myelopathy or radiculopathy, thoracic region: Secondary | ICD-10-CM | POA: Diagnosis not present

## 2019-03-22 ENCOUNTER — Telehealth: Payer: Self-pay | Admitting: Internal Medicine

## 2019-03-22 NOTE — Chronic Care Management (AMB) (Signed)
°  Chronic Care Management   Outreach Note  03/22/2019 Name: TIANN LAFONT MRN: [id number] DOB: 1950/10/27  Referred by: Glendale Chard, MD Reason for referral : Chronic Care Management (Initial CCM outreach )   An unsuccessful telephone outreach was attempted today. The patient was referred to the case management team by for assistance with care management and care coordination.   Follow Up Plan: The care management team will reach out to the patient again over the next 7 days.   [place]  [email or web address]   ??[id number]

## 2019-03-28 ENCOUNTER — Encounter: Payer: Self-pay | Admitting: Internal Medicine

## 2019-03-28 ENCOUNTER — Other Ambulatory Visit: Payer: Self-pay

## 2019-03-28 ENCOUNTER — Ambulatory Visit (INDEPENDENT_AMBULATORY_CARE_PROVIDER_SITE_OTHER): Payer: Medicare Other | Admitting: Internal Medicine

## 2019-03-28 ENCOUNTER — Other Ambulatory Visit: Payer: Self-pay | Admitting: Internal Medicine

## 2019-03-28 VITALS — BP 138/68 | HR 76 | Temp 97.6°F | Ht 65.6 in | Wt 163.2 lb

## 2019-03-28 DIAGNOSIS — R1031 Right lower quadrant pain: Secondary | ICD-10-CM | POA: Diagnosis not present

## 2019-03-28 DIAGNOSIS — R350 Frequency of micturition: Secondary | ICD-10-CM

## 2019-03-28 LAB — POCT URINALYSIS DIPSTICK
Bilirubin, UA: NEGATIVE
Blood, UA: NEGATIVE
Glucose, UA: NEGATIVE
Ketones, UA: NEGATIVE
Nitrite, UA: NEGATIVE
Protein, UA: NEGATIVE
Spec Grav, UA: 1.01 (ref 1.010–1.025)
Urobilinogen, UA: 0.2 E.U./dL
pH, UA: 7 (ref 5.0–8.0)

## 2019-03-28 MED ORDER — KETOROLAC TROMETHAMINE 60 MG/2ML IM SOLN
60.0000 mg | Freq: Once | INTRAMUSCULAR | Status: AC
  Administered 2019-03-28: 60 mg via INTRAMUSCULAR

## 2019-03-28 NOTE — Patient Instructions (Signed)
Dulcolax rectal suppository -- use one today  Phazyme - to help with gas pains  Please call me [phone] on Wednesday morning to let me know how you are doing.

## 2019-03-28 NOTE — Progress Notes (Signed)
Subjective:     Patient ID: Maria Hall , female    DOB: 11/05/1950 , 68 y.o.   MRN: [id number]   Chief Complaint  Patient presents with  . Abdominal Pain    HPI  She is here today for further evaluation of abdominal pain.  She reports her symptoms started last Wednesday. Initially thought was gas -took Tums, baking soda and water without relief. She reports having some relief with passing flatus. But then the discomfort returns. She has had hysterectomy, but thinks she still has her ovaries.   Abdominal Pain This is a new problem. The current episode started in the past 7 days. The problem occurs constantly. The problem has been unchanged. The pain is located in the RLQ. The pain is at a severity of 6/10. The pain is moderate. The quality of the pain is sharp. The abdominal pain radiates to the back. Associated symptoms include constipation, flatus and frequency. Pertinent negatives include no nausea or vomiting. The pain is aggravated by certain positions. The pain is relieved by passing flatus. She has tried antacids for the symptoms. The treatment provided mild relief. Her past medical history is significant for abdominal surgery. There is no history of colon cancer, Crohn's disease, GERD or ulcerative colitis.     Past Medical History:  Diagnosis Date  . 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 Bronchitis    gets every year in the winter  . Insomnia    takes meds  . Right rotator cuff tear   . Swelling    in hands and ankles  . Thyroid disease      Family History  Adopted: Yes     Current Outpatient Medications:  .  acetaminophen (ARTHRITIS PAIN) 650 MG CR tablet, Take 650 mg by mouth. Take 2 caplets daily, Disp: , Rfl:  .  albuterol (VENTOLIN HFA) 108 (90 Base) MCG/ACT inhaler, INHALE 2 PUFFS BY MOUTH EVERY 4 TO 6 HOURS AS NEEDED, Disp: 6.7 g, Rfl: 3 .  Ascorbic Acid (VITAMIN C) 1000 MG tablet, Take 1,000 mg by mouth daily., Disp: , Rfl:  .  aspirin EC 81 MG tablet,  Take 81 mg by mouth daily., Disp: , Rfl:  .  cholecalciferol (VITAMIN D) 1000 units tablet, Take 1,000 Units by mouth daily., Disp: , Rfl:  .  Docusate Calcium (STOOL SOFTENER PO), Take 100 mg by mouth. Take 2 soft gels daily, Disp: , Rfl:  .  fluticasone (FLONASE) 50 MCG/ACT nasal spray, SPRAY 1 SPRAY INTO EACH NOSTRIL EVERY DAY, Disp: 16 g, Rfl: 3 .  folic acid (FOLVITE) 1 MG tablet, TAKE 1 TABLET BY MOUTH EVERY DAY, Disp: 90 tablet, Rfl: 0 .  hydrochlorothiazide (HYDRODIURIL) 12.5 MG tablet, TAKE 1 TABLET BY MOUTH EVERY DAY, Disp: 90 tablet, Rfl: 3 .  levothyroxine (SYNTHROID) 88 MCG tablet, TAKE 1 TABLET (88 MCG TOTAL) BY MOUTH DAILY BEFORE BREAKFAST. MON - SAT ONLY, Disp: 78 tablet, Rfl: 1 .  Omega 3-6-9 Fatty Acids (OMEGA 3-6-9 COMPLEX PO), Take 1,600 mg by mouth daily at 6 (six) AM., Disp: , Rfl:  .  traZODone (DESYREL) 100 MG tablet, TAKE 1 TABLET BY MOUTH EVERYDAY AT BEDTIME, Disp: 90 tablet, Rfl: 1 .  vitamin E 400 UNIT capsule, Take 400 Units by mouth daily., Disp: , Rfl:    No Known Allergies   Review of Systems  Constitutional: Negative.   Respiratory: Negative.   Cardiovascular: Negative.   Gastrointestinal: Positive for abdominal pain, constipation and flatus.  Negative for nausea and vomiting.  Genitourinary: Positive for frequency.  Neurological: Negative.   Psychiatric/Behavioral: Negative.      Today's Vitals   03/28/19 1021  BP: 138/68  Pulse: 76  Temp: 97.6 F (36.4 C)  TempSrc: Oral  SpO2: 97%  Weight: 163 lb 3.2 oz (74 kg)  Height: 5' 5.6" (1.666 m)   Body mass index is 26.66 kg/m.   Objective:  Physical Exam Vitals signs and nursing note reviewed.  Constitutional:      Appearance: Normal appearance.  HENT:     Head: Normocephalic and atraumatic.  Eyes:     Extraocular Movements: Extraocular movements intact.  Cardiovascular:     Rate and Rhythm: Normal rate and regular rhythm.     Heart sounds: Normal heart sounds.  Pulmonary:     Effort:  Pulmonary effort is normal.     Breath sounds: Normal breath sounds.  Abdominal:     General: Abdomen is flat. Bowel sounds are normal.     Palpations: Abdomen is soft. There is no mass.     Tenderness: There is generalized abdominal tenderness. There is no right CVA tenderness or left CVA tenderness.  Skin:    General: Skin is warm.  Neurological:     General: No focal deficit present.     Mental Status: She is alert.  Psychiatric:        Mood and Affect: Mood normal.        Behavior: Behavior normal.         Assessment And Plan:     1. Urinary frequency  I will check urine culture today. She is encouraged to stay well hydrated.   - POCT Urinalysis Dipstick (81002) - Culture, Urine  2. RLQ abdominal pain  She was given toradol, 60mg  IM x1. She was also advised to let me know if her sx persist/worsen. I will order additional studies at that time if needed.   - ketorolac (TORADOL) injection 60 mg   Maximino Greenland, MD    THE PATIENT IS ENCOURAGED TO PRACTICE SOCIAL DISTANCING DUE TO THE COVID-19 PANDEMIC.

## 2019-03-30 ENCOUNTER — Other Ambulatory Visit: Payer: Self-pay | Admitting: Internal Medicine

## 2019-03-30 LAB — URINE CULTURE

## 2019-03-30 MED ORDER — TRAMADOL HCL 50 MG PO TABS: 50.0000 mg | ORAL_TABLET | Freq: Four times a day (QID) | ORAL | 0 refills | Status: DC | PRN

## 2019-04-03 DIAGNOSIS — M9902 Segmental and somatic dysfunction of thoracic region: Secondary | ICD-10-CM | POA: Diagnosis not present

## 2019-04-03 DIAGNOSIS — M47814 Spondylosis without myelopathy or radiculopathy, thoracic region: Secondary | ICD-10-CM | POA: Diagnosis not present

## 2019-04-03 DIAGNOSIS — M9903 Segmental and somatic dysfunction of lumbar region: Secondary | ICD-10-CM | POA: Diagnosis not present

## 2019-04-03 DIAGNOSIS — M546 Pain in thoracic spine: Secondary | ICD-10-CM | POA: Diagnosis not present

## 2019-04-06 ENCOUNTER — Telehealth: Payer: Self-pay

## 2019-04-06 ENCOUNTER — Other Ambulatory Visit: Payer: Self-pay | Admitting: Internal Medicine

## 2019-04-06 NOTE — Telephone Encounter (Signed)
Left vm for pt to return call.  Pt should go to the er if pain is worse than before.

## 2019-04-07 ENCOUNTER — Telehealth: Payer: Self-pay

## 2019-04-07 NOTE — Telephone Encounter (Signed)
Yes, she may take that medication as needed.

## 2019-04-07 NOTE — Telephone Encounter (Signed)
I returned the pt's call and notified her that she should go to the er if her pain is worse.  The pt said that she took a pain pill this morning so it's not so intense as is it was yesterday and that she would like to have her ultrasound done asap but that she know that it could take some time for it to be scheduled. The pt wants to know if it is ok to take hydrocodone 10/325 to help with her pain because she has some at home.  The pt said that she will not take one until she know that it's ok.

## 2019-04-07 NOTE — Telephone Encounter (Signed)
I returned the pt's call and notified her that Dr. Baird Cancer said it's ok for the pt to take the hydrocone 10/325 as needed.

## 2019-04-10 ENCOUNTER — Other Ambulatory Visit: Payer: Self-pay | Admitting: Internal Medicine

## 2019-04-10 DIAGNOSIS — M47814 Spondylosis without myelopathy or radiculopathy, thoracic region: Secondary | ICD-10-CM | POA: Diagnosis not present

## 2019-04-10 DIAGNOSIS — M546 Pain in thoracic spine: Secondary | ICD-10-CM | POA: Diagnosis not present

## 2019-04-10 DIAGNOSIS — R1031 Right lower quadrant pain: Secondary | ICD-10-CM

## 2019-04-10 DIAGNOSIS — M9902 Segmental and somatic dysfunction of thoracic region: Secondary | ICD-10-CM | POA: Diagnosis not present

## 2019-04-10 DIAGNOSIS — M9903 Segmental and somatic dysfunction of lumbar region: Secondary | ICD-10-CM | POA: Diagnosis not present

## 2019-04-16 ENCOUNTER — Other Ambulatory Visit: Payer: Self-pay | Admitting: Hematology & Oncology

## 2019-04-16 DIAGNOSIS — D473 Essential (hemorrhagic) thrombocythemia: Secondary | ICD-10-CM

## 2019-04-16 DIAGNOSIS — D573 Sickle-cell trait: Secondary | ICD-10-CM

## 2019-04-17 DIAGNOSIS — M9902 Segmental and somatic dysfunction of thoracic region: Secondary | ICD-10-CM | POA: Diagnosis not present

## 2019-04-17 DIAGNOSIS — M47814 Spondylosis without myelopathy or radiculopathy, thoracic region: Secondary | ICD-10-CM | POA: Diagnosis not present

## 2019-04-17 DIAGNOSIS — M9903 Segmental and somatic dysfunction of lumbar region: Secondary | ICD-10-CM | POA: Diagnosis not present

## 2019-04-17 DIAGNOSIS — M546 Pain in thoracic spine: Secondary | ICD-10-CM | POA: Diagnosis not present

## 2019-04-18 ENCOUNTER — Ambulatory Visit
Admission: RE | Admit: 2019-04-18 | Discharge: 2019-04-18 | Disposition: A | Discharge status: Home / Self Care | Payer: Medicare Other | Source: Ambulatory Visit | Attending: Internal Medicine | Admitting: Internal Medicine

## 2019-04-18 DIAGNOSIS — N281 Cyst of kidney, acquired: Secondary | ICD-10-CM | POA: Diagnosis not present

## 2019-04-18 DIAGNOSIS — R1031 Right lower quadrant pain: Secondary | ICD-10-CM

## 2019-04-18 DIAGNOSIS — K429 Umbilical hernia without obstruction or gangrene: Secondary | ICD-10-CM | POA: Diagnosis not present

## 2019-04-18 MED ORDER — IOPAMIDOL (ISOVUE-300) INJECTION 61%
100.0000 mL | Freq: Once | INTRAVENOUS | Status: AC | PRN
  Administered 2019-04-18: 10:00:00 100 mL via INTRAVENOUS

## 2019-04-23 ENCOUNTER — Other Ambulatory Visit: Payer: Self-pay | Admitting: Internal Medicine

## 2019-04-23 MED ORDER — CYCLOBENZAPRINE HCL 5 MG PO TABS: 5.0000 mg | ORAL_TABLET | Freq: Every evening | ORAL | 0 refills | Status: DC | PRN

## 2019-04-24 DIAGNOSIS — M9903 Segmental and somatic dysfunction of lumbar region: Secondary | ICD-10-CM | POA: Diagnosis not present

## 2019-04-24 DIAGNOSIS — M546 Pain in thoracic spine: Secondary | ICD-10-CM | POA: Diagnosis not present

## 2019-04-24 DIAGNOSIS — M47814 Spondylosis without myelopathy or radiculopathy, thoracic region: Secondary | ICD-10-CM | POA: Diagnosis not present

## 2019-04-24 DIAGNOSIS — M9902 Segmental and somatic dysfunction of thoracic region: Secondary | ICD-10-CM | POA: Diagnosis not present

## 2019-04-28 ENCOUNTER — Other Ambulatory Visit: Payer: Self-pay | Admitting: Internal Medicine

## 2019-05-01 ENCOUNTER — Ambulatory Visit: Payer: Medicare Other | Admitting: Internal Medicine

## 2019-05-01 ENCOUNTER — Encounter: Payer: Self-pay | Admitting: Nurse Practitioner

## 2019-05-01 ENCOUNTER — Other Ambulatory Visit: Payer: Self-pay

## 2019-05-01 ENCOUNTER — Ambulatory Visit (INDEPENDENT_AMBULATORY_CARE_PROVIDER_SITE_OTHER): Payer: Medicare Other | Admitting: Nurse Practitioner

## 2019-05-01 VITALS — BP 102/60 | HR 74 | Temp 98.2°F | Ht 64.8 in | Wt 158.0 lb

## 2019-05-01 DIAGNOSIS — R1031 Right lower quadrant pain: Secondary | ICD-10-CM | POA: Diagnosis not present

## 2019-05-01 NOTE — Progress Notes (Signed)
Subjective:     Patient ID: Maria Hall , female    DOB: [birth year] , 68 y.o.   MRN: [id number]   Chief Complaint  Patient presents with  . Abdominal Pain    patient stated she came in a month ago for abdominal pain and was sent ti have a CT scan and it came back normal.she was given a muscle relaxer and it works for the most part but she would just like to know what the problem is    HPI  She was seen by Dr. Baird Cancer and given a muscle relaxant.  She would like to know what it is, she is able to sleep at night now.  Had not been moving or pulling or straining.  Feels a little like menstrual cramps.  Hysterectomy in 30's.  She feels they left the right ovary in tact.    Abdominal Pain Pertinent negatives include no headaches.     Past Medical History:  Diagnosis Date  . 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 Bronchitis    gets every year in the winter  . Insomnia    takes meds  . Right rotator cuff tear   . Swelling    in hands and ankles  . Thyroid disease      Family History  Adopted: Yes     Current Outpatient Medications:  .  acetaminophen (ARTHRITIS PAIN) 650 MG CR tablet, Take 650 mg by mouth. Take 2 caplets daily, Disp: , Rfl:  .  albuterol (VENTOLIN HFA) 108 (90 Base) MCG/ACT inhaler, INHALE 2 PUFFS BY MOUTH EVERY 4 TO 6 HOURS AS NEEDED, Disp: 6.7 g, Rfl: 3 .  Ascorbic Acid (VITAMIN C) 1000 MG tablet, Take 1,000 mg by mouth daily., Disp: , Rfl:  .  aspirin EC 81 MG tablet, Take 81 mg by mouth daily., Disp: , Rfl:  .  cholecalciferol (VITAMIN D) 1000 units tablet, Take 1,000 Units by mouth daily., Disp: , Rfl:  .  cyclobenzaprine (FLEXERIL) 5 MG tablet, Take 1 tablet (5 mg total) by mouth at bedtime as needed for muscle spasms., Disp: 30 tablet, Rfl: 0 .  Docusate Calcium (STOOL SOFTENER PO), Take 100 mg by mouth. Take 2 soft gels daily, Disp: , Rfl:  .  fluticasone (FLONASE) 50 MCG/ACT nasal spray, SPRAY 1 SPRAY INTO EACH NOSTRIL EVERY DAY, Disp: 48 mL, Rfl:  1 .  folic acid (FOLVITE) 1 MG tablet, TAKE 1 TABLET BY MOUTH EVERY DAY, Disp: 90 tablet, Rfl: 0 .  hydrochlorothiazide (HYDRODIURIL) 12.5 MG tablet, TAKE 1 TABLET BY MOUTH EVERY DAY, Disp: 90 tablet, Rfl: 3 .  levothyroxine (SYNTHROID) 88 MCG tablet, TAKE 1 TABLET (88 MCG TOTAL) BY MOUTH DAILY BEFORE BREAKFAST. MON - SAT ONLY, Disp: 78 tablet, Rfl: 1 .  Omega 3-6-9 Fatty Acids (OMEGA 3-6-9 COMPLEX PO), Take 1,600 mg by mouth daily at 6 (six) AM., Disp: , Rfl:  .  vitamin E 400 UNIT capsule, Take 400 Units by mouth daily., Disp: , Rfl:    No Known Allergies   Review of Systems  Constitutional: Negative.   Respiratory: Negative.   Cardiovascular: Negative.  Negative for chest pain, palpitations and leg swelling.  Gastrointestinal: Positive for abdominal pain.  Neurological: Negative for dizziness and headaches.     Today's Vitals   05/01/19 1040  BP: 102/60  Pulse: 74  Temp: 98.2 F (36.8 C)  TempSrc: Oral  Weight: 158 lb (71.7 kg)  Height: 5' 4.8" (1.646 m)  PainSc:  0-No pain   Body mass index is 26.46 kg/m.   Objective:  Physical Exam Constitutional:      Appearance: She is well-developed.  Neurological:     Mental Status: She is alert.         Assessment And Plan:     1. RLQ abdominal pain  CT scan of abdomen was negative for acute abdominal issues  She is to continue with muscle relaxant and encouraged her to stretch regularly while sitting in the bed  I have also given her samples of a probiotic  Increase her water intake to at least 4 bottles per day   Minette Brine, FNP    THE PATIENT IS ENCOURAGED TO PRACTICE SOCIAL DISTANCING DUE TO THE COVID-19 PANDEMIC.

## 2019-05-02 ENCOUNTER — Other Ambulatory Visit: Payer: Self-pay | Admitting: Internal Medicine

## 2019-05-02 ENCOUNTER — Encounter: Payer: Self-pay | Admitting: Nurse Practitioner

## 2019-05-02 DIAGNOSIS — Z1231 Encounter for screening mammogram for malignant neoplasm of breast: Secondary | ICD-10-CM

## 2019-05-02 NOTE — Telephone Encounter (Signed)
Please update her influenza and check her pneumonia vaccines

## 2019-05-10 DIAGNOSIS — M47814 Spondylosis without myelopathy or radiculopathy, thoracic region: Secondary | ICD-10-CM | POA: Diagnosis not present

## 2019-05-10 DIAGNOSIS — M546 Pain in thoracic spine: Secondary | ICD-10-CM | POA: Diagnosis not present

## 2019-05-10 DIAGNOSIS — M9903 Segmental and somatic dysfunction of lumbar region: Secondary | ICD-10-CM | POA: Diagnosis not present

## 2019-05-10 DIAGNOSIS — M9902 Segmental and somatic dysfunction of thoracic region: Secondary | ICD-10-CM | POA: Diagnosis not present

## 2019-05-15 DIAGNOSIS — M546 Pain in thoracic spine: Secondary | ICD-10-CM | POA: Diagnosis not present

## 2019-05-15 DIAGNOSIS — M9902 Segmental and somatic dysfunction of thoracic region: Secondary | ICD-10-CM | POA: Diagnosis not present

## 2019-05-15 DIAGNOSIS — M47814 Spondylosis without myelopathy or radiculopathy, thoracic region: Secondary | ICD-10-CM | POA: Diagnosis not present

## 2019-05-15 DIAGNOSIS — M9903 Segmental and somatic dysfunction of lumbar region: Secondary | ICD-10-CM | POA: Diagnosis not present

## 2019-05-17 ENCOUNTER — Ambulatory Visit
Admission: RE | Admit: 2019-05-17 | Discharge: 2019-05-17 | Disposition: A | Discharge status: Home / Self Care | Payer: Medicare Other | Source: Ambulatory Visit | Attending: Internal Medicine | Admitting: Internal Medicine

## 2019-05-17 ENCOUNTER — Other Ambulatory Visit: Payer: Self-pay

## 2019-05-17 DIAGNOSIS — Z1231 Encounter for screening mammogram for malignant neoplasm of breast: Secondary | ICD-10-CM

## 2019-05-22 DIAGNOSIS — M47814 Spondylosis without myelopathy or radiculopathy, thoracic region: Secondary | ICD-10-CM | POA: Diagnosis not present

## 2019-05-22 DIAGNOSIS — M546 Pain in thoracic spine: Secondary | ICD-10-CM | POA: Diagnosis not present

## 2019-05-22 DIAGNOSIS — M9903 Segmental and somatic dysfunction of lumbar region: Secondary | ICD-10-CM | POA: Diagnosis not present

## 2019-05-22 DIAGNOSIS — M9902 Segmental and somatic dysfunction of thoracic region: Secondary | ICD-10-CM | POA: Diagnosis not present

## 2019-05-31 DIAGNOSIS — M47814 Spondylosis without myelopathy or radiculopathy, thoracic region: Secondary | ICD-10-CM | POA: Diagnosis not present

## 2019-05-31 DIAGNOSIS — M9903 Segmental and somatic dysfunction of lumbar region: Secondary | ICD-10-CM | POA: Diagnosis not present

## 2019-05-31 DIAGNOSIS — M546 Pain in thoracic spine: Secondary | ICD-10-CM | POA: Diagnosis not present

## 2019-05-31 DIAGNOSIS — M9902 Segmental and somatic dysfunction of thoracic region: Secondary | ICD-10-CM | POA: Diagnosis not present

## 2019-06-09 ENCOUNTER — Other Ambulatory Visit: Payer: Self-pay | Admitting: Internal Medicine

## 2019-06-11 ENCOUNTER — Other Ambulatory Visit: Payer: Self-pay | Admitting: Internal Medicine

## 2019-06-21 ENCOUNTER — Encounter: Payer: Self-pay | Admitting: Nurse Practitioner

## 2019-06-21 ENCOUNTER — Ambulatory Visit: Payer: Medicare Other | Admitting: Internal Medicine

## 2019-06-21 ENCOUNTER — Other Ambulatory Visit: Payer: Self-pay

## 2019-06-21 ENCOUNTER — Ambulatory Visit: Payer: Medicare Other

## 2019-06-21 ENCOUNTER — Ambulatory Visit (INDEPENDENT_AMBULATORY_CARE_PROVIDER_SITE_OTHER): Payer: Medicare Other | Admitting: Nurse Practitioner

## 2019-06-21 ENCOUNTER — Ambulatory Visit (INDEPENDENT_AMBULATORY_CARE_PROVIDER_SITE_OTHER): Payer: Medicare Other

## 2019-06-21 VITALS — BP 100/62 | HR 92 | Temp 98.2°F | Ht 64.6 in | Wt 158.2 lb

## 2019-06-21 VITALS — BP 100/62 | HR 92 | Temp 98.2°F | Ht 64.6 in | Wt 158.0 lb

## 2019-06-21 DIAGNOSIS — R7309 Other abnormal glucose: Secondary | ICD-10-CM | POA: Diagnosis not present

## 2019-06-21 DIAGNOSIS — R3911 Hesitancy of micturition: Secondary | ICD-10-CM | POA: Diagnosis not present

## 2019-06-21 DIAGNOSIS — G4700 Insomnia, unspecified: Secondary | ICD-10-CM | POA: Diagnosis not present

## 2019-06-21 DIAGNOSIS — I10 Essential (primary) hypertension: Secondary | ICD-10-CM | POA: Insufficient documentation

## 2019-06-21 DIAGNOSIS — E039 Hypothyroidism, unspecified: Secondary | ICD-10-CM

## 2019-06-21 DIAGNOSIS — Z0000 Encounter for general adult medical examination without abnormal findings: Secondary | ICD-10-CM

## 2019-06-21 LAB — POCT URINALYSIS DIPSTICK
Bilirubin, UA: NEGATIVE
Blood, UA: NEGATIVE
Glucose, UA: NEGATIVE
Ketones, UA: NEGATIVE
Leukocytes, UA: NEGATIVE
Nitrite, UA: NEGATIVE
Protein, UA: NEGATIVE
Spec Grav, UA: 1.02 (ref 1.010–1.025)
Urobilinogen, UA: 0.2 E.U./dL
pH, UA: 7.5 (ref 5.0–8.0)

## 2019-06-21 MED ORDER — TRAZODONE HCL 100 MG PO TABS: 100.0000 mg | ORAL_TABLET | Freq: Every day | ORAL | 3 refills | Status: DC

## 2019-06-21 MED ORDER — FLUTICASONE PROPIONATE 50 MCG/ACT NA SUSP: NASAL | 1 refills | Status: DC

## 2019-06-21 NOTE — Patient Instructions (Signed)
Health Maintenance After Age 70 After age 70, you are at a higher risk for certain long-term diseases and infections as well as injuries from falls. Falls are a major cause of broken bones and head injuries in people who are older than age 70. Getting regular preventive care can help to keep you healthy and well. Preventive care includes getting regular testing and making lifestyle changes as recommended by your health care provider. Talk with your health care provider about:  Which screenings and tests you should have. A screening is a test that checks for a disease when you have no symptoms.  A diet and exercise plan that is right for you. What should I know about screenings and tests to prevent falls? Screening and testing are the best ways to find a health problem early. Early diagnosis and treatment give you the best chance of managing medical conditions that are common after age 70. Certain conditions and lifestyle choices may make you more likely to have a fall. Your health care provider may recommend:  Regular vision checks. Poor vision and conditions such as cataracts can make you more likely to have a fall. If you wear glasses, make sure to get your prescription updated if your vision changes.  Medicine review. Work with your health care provider to regularly review all of the medicines you are taking, including over-the-counter medicines. Ask your health care provider about any side effects that may make you more likely to have a fall. Tell your health care provider if any medicines that you take make you feel dizzy or sleepy.  Osteoporosis screening. Osteoporosis is a condition that causes the bones to get weaker. This can make the bones weak and cause them to break more easily.  Blood pressure screening. Blood pressure changes and medicines to control blood pressure can make you feel dizzy.  Strength and balance checks. Your health care provider may recommend certain tests to check your  strength and balance while standing, walking, or changing positions.  Foot health exam. Foot pain and numbness, as well as not wearing proper footwear, can make you more likely to have a fall.  Depression screening. You may be more likely to have a fall if you have a fear of falling, feel emotionally low, or feel unable to do activities that you used to do.  Alcohol use screening. Using too much alcohol can affect your balance and may make you more likely to have a fall. What actions can I take to lower my risk of falls? General instructions  Talk with your health care provider about your risks for falling. Tell your health care provider if: ? You fall. Be sure to tell your health care provider about all falls, even ones that seem minor. ? You feel dizzy, sleepy, or off-balance.  Take over-the-counter and prescription medicines only as told by your health care provider. These include any supplements.  Eat a healthy diet and maintain a healthy weight. A healthy diet includes low-fat dairy products, low-fat (lean) meats, and fiber from whole grains, beans, and lots of fruits and vegetables. Home safety  Remove any tripping hazards, such as rugs, cords, and clutter.  Install safety equipment such as grab bars in bathrooms and safety rails on stairs.  Keep rooms and walkways well-lit. Activity   Follow a regular exercise program to stay fit. This will help you maintain your balance. Ask your health care provider what types of exercise are appropriate for you.  If you need a cane or   walker, use it as recommended by your health care provider.  Wear supportive shoes that have nonskid soles. Lifestyle  Do not drink alcohol if your health care provider tells you not to drink.  If you drink alcohol, limit how much you have: ? 0-1 drink a day for women. ? 0-2 drinks a day for men.  Be aware of how much alcohol is in your drink. In the [place] one drink equals one typical bottle of beer (12  oz), one-half glass of wine (5 oz), or one shot of hard liquor (1 oz).  Do not use any products that contain nicotine or tobacco, such as cigarettes and e-cigarettes. If you need help quitting, ask your health care provider. Summary  Having a healthy lifestyle and getting preventive care can help to protect your health and wellness after age 70.  Screening and testing are the best way to find a health problem early and help you avoid having a fall. Early diagnosis and treatment give you the best chance for managing medical conditions that are more common for people who are older than age 6.  Falls are a major cause of broken bones and head injuries in people who are older than age 19. Take precautions to prevent a fall at home.  Work with your health care provider to learn what changes you can make to improve your health and wellness and to prevent falls. This information is not intended to replace advice given to you by your health care provider. Make sure you discuss any questions you have with your health care provider. Document Released: 04/21/2017 Document Revised: 09/29/2018 Document Reviewed: 04/21/2017 Elsevier Patient Education  2020 [place] Maintenance  Topic Date Due  . MAMMOGRAM  05/16/2021  . COLONOSCOPY  08/10/2022  . TETANUS/TDAP  01/23/2024  . INFLUENZA VACCINE  Completed  . DEXA SCAN  Completed  . Hepatitis C Screening  Completed  . PNA vac Low Risk Adult  Completed

## 2019-06-21 NOTE — Progress Notes (Signed)
This visit occurred during the SARS-CoV-2 public health emergency.  Safety protocols were in place, including screening questions prior to the visit, additional usage of staff PPE, and extensive cleaning of exam room while observing appropriate contact time as indicated for disinfecting solutions.  Subjective:   Maria Hall is a 68 y.o. female who presents for Medicare Annual (Subsequent) preventive examination.  Review of Systems:  n/a Cardiac Risk Factors include: advanced age (>59men, >69 women);sedentary lifestyle     Objective:     Vitals: BP 100/62 (BP Location: Left Arm, Patient Position: Sitting, Cuff Size: Normal)   Pulse 92   Temp 98.2 F (36.8 C) (Oral)   Ht 5' 4.6" (1.641 m)   Wt 158 lb 3.2 oz (71.8 kg)   SpO2 96%   BMI 26.65 kg/m   Body mass index is 26.65 kg/m.  Advanced Directives 06/21/2019 06/09/2018 11/24/2016 11/24/2014 04/11/2014  Does Patient Have a Medical Advance Directive? Yes Yes Yes No No  Type of Paramedic of [place];Living will Living will;Healthcare Power of [place] - -  Does patient want to make changes to medical advance directive? - No - Patient declined - - -  Copy of [place] in Chart? No - copy requested No - copy requested - - -  Would patient like information on creating a medical advance directive? - - - - No - patient declined information    Tobacco Social History   Tobacco Use  Smoking Status Former Smoker  . Packs/day: 1.00  . Years: 20.00  . Pack years: 20.00  . Types: Cigarettes  . Quit date: 72  . Years since quitting: 31.0  Smokeless Tobacco Never Used     Counseling given: Not Answered   Clinical Intake:  Pre-visit preparation completed: Yes  Pain : 0-10 Pain Score: 6  Pain Type: Chronic pain Pain Location: Hip Pain Orientation: Right, Left Pain Radiating Towards: none Pain Descriptors / Indicators: Aching Pain Onset: More than a  month ago Pain Frequency: Intermittent Pain Relieving Factors: nothing  Pain Relieving Factors: nothing  Nutritional Status: BMI 25 -29 Overweight Nutritional Risks: None Diabetes: No  How often do you need to have someone help you when you read instructions, pamphlets, or other written materials from your doctor or pharmacy?: 1 - Never What is the last grade level you completed in school?: Bachelor's degree  Interpreter Needed?: No  Information entered by :: NAllen LPN  Past Medical History:  Diagnosis Date  . 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 Bronchitis    gets every year in the winter  . Insomnia    takes meds  . Right rotator cuff tear   . Swelling    in hands and ankles  . Thyroid disease    Past Surgical History:  Procedure Laterality Date  . ABDOMINAL HYSTERECTOMY    . BUNIONECTOMY     bil feet  . CESAREAN SECTION    . CESAREAN SECTION     1 time  . CHOLECYSTECTOMY    . COLONOSCOPY    . tummy tuck     Family History  Adopted: Yes   Social History   Socioeconomic History  . Marital status: Married    Spouse name: Not on file  . Number of children: Not on file  . Years of education: Not on file  . Highest education level: Not on file  Occupational History  . Occupation: retired  Tobacco Use  .  Smoking status: Former Smoker    Packs/day: 1.00    Years: 20.00    Pack years: 20.00    Types: Cigarettes    Quit date: 1990    Years since quitting: 31.0  . Smokeless tobacco: Never Used  Substance and Sexual Activity  . Alcohol use: Not Currently  . Drug use: No  . Sexual activity: Yes  Other Topics Concern  . Not on file  Social History Narrative  . Not on file   Social Determinants of Health   Financial Resource Strain: Low Risk   . Difficulty of Paying Living Expenses: Not hard at all  Food Insecurity: No Food Insecurity  . Worried About Charity fundraiser in the Last Year: Never true  . Ran Out of Food in the Last Year: Never true    Transportation Needs: No Transportation Needs  . Lack of Transportation (Medical): No  . Lack of Transportation (Non-Medical): No  Physical Activity: Insufficiently Active  . Days of Exercise per Week: 2 days  . Minutes of Exercise per Session: 30 min  Stress: No Stress Concern Present  . Feeling of Stress : Not at all  Social Connections:   . Frequency of Communication with Friends and Family: Not on file  . Frequency of Social Gatherings with Friends and Family: Not on file  . Attends Religious Services: Not on file  . Active Member of Clubs or Organizations: Not on file  . Attends Archivist Meetings: Not on file  . Marital Status: Not on file    Outpatient Encounter Medications as of 06/21/2019  Medication Sig  . acetaminophen (ARTHRITIS PAIN) 650 MG CR tablet Take 650 mg by mouth. Take 2 caplets daily  . albuterol (VENTOLIN HFA) 108 (90 Base) MCG/ACT inhaler INHALE 2 PUFFS BY MOUTH EVERY 4 TO 6 HOURS AS NEEDED  . Ascorbic Acid (VITAMIN C) 1000 MG tablet Take 1,000 mg by mouth daily.  Marland Kitchen aspirin EC 81 MG tablet Take 81 mg by mouth daily.  . cholecalciferol (VITAMIN D) 1000 units tablet Take 1,000 Units by mouth daily.  . cyclobenzaprine (FLEXERIL) 5 MG tablet Take 1 tablet (5 mg total) by mouth at bedtime as needed for muscle spasms.  Mariane Baumgarten Calcium (STOOL SOFTENER PO) Take 100 mg by mouth. Take 2 soft gels daily  . folic acid (FOLVITE) 1 MG tablet TAKE 1 TABLET BY MOUTH EVERY DAY  . hydrochlorothiazide (HYDRODIURIL) 12.5 MG tablet TAKE 1 TABLET BY MOUTH EVERY DAY  . levothyroxine (SYNTHROID) 88 MCG tablet TAKE 1 TABLET (88 MCG TOTAL) BY MOUTH DAILY BEFORE BREAKFAST. MON - SAT ONLY  . Omega 3-6-9 Fatty Acids (OMEGA 3-6-9 COMPLEX PO) Take 1,600 mg by mouth daily at 6 (six) AM.  . vitamin E 400 UNIT capsule Take 400 Units by mouth daily.  . [DISCONTINUED] fluticasone (FLONASE) 50 MCG/ACT nasal spray SPRAY 1 SPRAY INTO EACH NOSTRIL EVERY DAY   No facility-administered  encounter medications on file as of 06/21/2019.    Activities of Daily Living In your present state of health, do you have any difficulty performing the following activities: 06/21/2019  Hearing? N  Vision? N  Difficulty concentrating or making decisions? N  Walking or climbing stairs? N  Dressing or bathing? N  Doing errands, shopping? N  Preparing Food and eating ? N  Using the Toilet? N  In the past six months, have you accidently leaked urine? N  Do you have problems with loss of bowel control? N  Managing  your Medications? N  Managing your Finances? N  Housekeeping or managing your Housekeeping? N  Some recent data might be hidden    Patient Care Team: Glendale Chard, MD as PCP - General (Internal Medicine) Warden Fillers, MD as Consulting Physician (Ophthalmology)    Assessment:   This is a routine wellness examination for Yeng.  Exercise Activities and Dietary recommendations Current Exercise Habits: Home exercise routine, Type of exercise: walking, Time (Minutes): 30, Frequency (Times/Week): 2, Weekly Exercise (Minutes/Week): 60  Goals    . DIET - INCREASE WATER INTAKE (pt-stated)    . DIET - INCREASE WATER INTAKE     06/21/2019, wants to still work on water intake. Also would like to work on exercising (strength training)       Fall Risk Fall Risk  06/21/2019 05/01/2019 03/28/2019 12/12/2018 06/09/2018  Falls in the past year? 0 0 0 0 0  Comment - - - - -  Risk for fall due to : Medication side effect - - - -  Follow up Falls evaluation completed;Education provided;Falls prevention discussed - - - -   Is the patient's home free of loose throw rugs in walkways, pet beds, electrical cords, etc?   yes      Grab bars in the bathroom? no      Handrails on the stairs?   no      Adequate lighting?   yes  Timed Get Up and Go performed: n/a  Depression Screen PHQ 2/9 Scores 06/21/2019 05/01/2019 03/28/2019 12/12/2018  PHQ - 2 Score 0 0 0 0  PHQ- 9 Score 0 - -  -     Cognitive Function     6CIT Screen 06/21/2019 06/09/2018  What Year? 0 points 0 points  What month? 0 points 0 points  What time? 0 points 0 points  Count back from 20 0 points 0 points  Months in reverse 0 points 0 points  Repeat phrase 0 points 0 points  Total Score 0 0    Immunization History  Administered Date(s) Administered  . Influenza-Unspecified 04/11/2018, 03/03/2019  . Pneumococcal Conjugate-13 03/10/2016  . Pneumococcal Polysaccharide-23 07/12/2014  . Tdap 01/22/2014  . Zoster 03/09/2016  . Zoster Recombinat (Shingrix) 04/04/2017, 07/27/2017    Qualifies for Shingles Vaccine? yes  Screening Tests Health Maintenance  Topic Date Due  . MAMMOGRAM  05/16/2021  . COLONOSCOPY  08/10/2022  . TETANUS/TDAP  01/23/2024  . INFLUENZA VACCINE  Completed  . DEXA SCAN  Completed  . Hepatitis C Screening  Completed  . PNA vac Low Risk Adult  Completed    Cancer Screenings: Lung: Low Dose CT Chest recommended if Age 76-80 years, 30 pack-year currently smoking OR have quit w/in 15years. Patient does not qualify. Breast:  Up to date on Mammogram? Yes   Up to date of Bone Density/Dexa? Yes Colorectal: up to date  Additional Screenings: : Hepatitis C Screening: 05/2018     Plan:    Patient wants to continue to work on increasing water intake. She also wants to work on Editor, commissioning.   I have personally reviewed and noted the following in the patient's chart:   . Medical and social history . Use of alcohol, tobacco or illicit drugs  . Current medications and supplements . Functional ability and status . Nutritional status . Physical activity . Advanced directives . List of other physicians . Hospitalizations, surgeries, and ER visits in previous 12 months . Vitals . Screenings to include cognitive, depression, and falls . Referrals and  appointments  In addition, I have reviewed and discussed with patient certain preventive protocols, quality  metrics, and best practice recommendations. A written personalized care plan for preventive services as well as general preventive health recommendations were provided to patient.     Kellie Simmering, LPN  [id number]

## 2019-06-21 NOTE — Patient Instructions (Addendum)
Maria Hall , Thank you for taking time to come for your Medicare Wellness Visit. I appreciate your ongoing commitment to your health goals. Please review the following plan we discussed and let me know if I can assist you in the future.   Screening recommendations/referrals: Colonoscopy: 07/2017 Mammogram: 04/2019 Bone Density: 05/2014 Recommended yearly ophthalmology/optometry visit for glaucoma screening and checkup Recommended yearly dental visit for hygiene and checkup  Vaccinations: Influenza vaccine: 02/2019 Pneumococcal vaccine: 02/2016 Tdap vaccine: 01/2014 Shingles vaccine: discussed    Advanced directives: Please bring a copy of your POA (Power of [place]) and/or Living Will to your next appointment.    Conditions/risks identified: overweight  Next appointment: 12/20/2019 at 11:15   Preventive Care 65 Years and Older, Female Preventive care refers to lifestyle choices and visits with your health care provider that can promote health and wellness. What does preventive care include?  A yearly physical exam. This is also called an annual well check.  Dental exams once or twice a year.  Routine eye exams. Ask your health care provider how often you should have your eyes checked.  Personal lifestyle choices, including:  Daily care of your teeth and gums.  Regular physical activity.  Eating a healthy diet.  Avoiding tobacco and drug use.  Limiting alcohol use.  Practicing safe sex.  Taking low-dose aspirin every day.  Taking vitamin and mineral supplements as recommended by your health care provider. What happens during an annual well check? The services and screenings done by your health care provider during your annual well check will depend on your age, overall health, lifestyle risk factors, and family history of disease. Counseling  Your health care provider may ask you questions about your:  Alcohol use.  Tobacco use.  Drug use.  Emotional  well-being.  Home and relationship well-being.  Sexual activity.  Eating habits.  History of falls.  Memory and ability to understand (cognition).  Work and work Statistician.  Reproductive health. Screening  You may have the following tests or measurements:  Height, weight, and BMI.  Blood pressure.  Lipid and cholesterol levels. These may be checked every 5 years, or more frequently if you are over 59 years old.  Skin check.  Lung cancer screening. You may have this screening every year starting at age 52 if you have a 30-pack-year history of smoking and currently smoke or have quit within the past 15 years.  Fecal occult blood test (FOBT) of the stool. You may have this test every year starting at age 53.  Flexible sigmoidoscopy or colonoscopy. You may have a sigmoidoscopy every 5 years or a colonoscopy every 10 years starting at age 51.  Hepatitis C blood test.  Hepatitis B blood test.  Sexually transmitted disease (STD) testing.  Diabetes screening. This is done by checking your blood sugar (glucose) after you have not eaten for a while (fasting). You may have this done every 1-3 years.  Bone density scan. This is done to screen for osteoporosis. You may have this done starting at age 68.  Mammogram. This may be done every 1-2 years. Talk to your health care provider about how often you should have regular mammograms. Talk with your health care provider about your test results, treatment options, and if necessary, the need for more tests. Vaccines  Your health care provider may recommend certain vaccines, such as:  Influenza vaccine. This is recommended every year.  Tetanus, diphtheria, and acellular pertussis (Tdap, Td) vaccine. You may need a Td booster  every 10 years.  Zoster vaccine. You may need this after age 80.  Pneumococcal 13-valent conjugate (PCV13) vaccine. One dose is recommended after age 88.  Pneumococcal polysaccharide (PPSV23) vaccine. One  dose is recommended after age 62. Talk to your health care provider about which screenings and vaccines you need and how often you need them. This information is not intended to replace advice given to you by your health care provider. Make sure you discuss any questions you have with your health care provider. Document Released: 07/05/2015 Document Revised: 02/26/2016 Document Reviewed: 04/09/2015 Elsevier Interactive Patient Education  2017 [place] Prevention in the Home Falls can cause injuries. They can happen to people of all ages. There are many things you can do to make your home safe and to help prevent falls. What can I do on the outside of my home?  Regularly fix the edges of walkways and driveways and fix any cracks.  Remove anything that might make you trip as you walk through a door, such as a raised step or threshold.  Trim any bushes or trees on the path to your home.  Use bright outdoor lighting.  Clear any walking paths of anything that might make someone trip, such as rocks or tools.  Regularly check to see if handrails are loose or broken. Make sure that both sides of any steps have handrails.  Any raised decks and porches should have guardrails on the edges.  Have any leaves, snow, or ice cleared regularly.  Use sand or salt on walking paths during winter.  Clean up any spills in your garage right away. This includes oil or grease spills. What can I do in the bathroom?  Use night lights.  Install grab bars by the toilet and in the tub and shower. Do not use towel bars as grab bars.  Use non-skid mats or decals in the tub or shower.  If you need to sit down in the shower, use a plastic, non-slip stool.  Keep the floor dry. Clean up any water that spills on the floor as soon as it happens.  Remove soap buildup in the tub or shower regularly.  Attach bath mats securely with double-sided non-slip rug tape.  Do not have throw rugs and other  things on the floor that can make you trip. What can I do in the bedroom?  Use night lights.  Make sure that you have a light by your bed that is easy to reach.  Do not use any sheets or blankets that are too big for your bed. They should not hang down onto the floor.  Have a firm chair that has side arms. You can use this for support while you get dressed.  Do not have throw rugs and other things on the floor that can make you trip. What can I do in the kitchen?  Clean up any spills right away.  Avoid walking on wet floors.  Keep items that you use a lot in easy-to-reach places.  If you need to reach something above you, use a strong step stool that has a grab bar.  Keep electrical cords out of the way.  Do not use floor polish or wax that makes floors slippery. If you must use wax, use non-skid floor wax.  Do not have throw rugs and other things on the floor that can make you trip. What can I do with my stairs?  Do not leave any items on the stairs.  Make  sure that there are handrails on both sides of the stairs and use them. Fix handrails that are broken or loose. Make sure that handrails are as long as the stairways.  Check any carpeting to make sure that it is firmly attached to the stairs. Fix any carpet that is loose or worn.  Avoid having throw rugs at the top or bottom of the stairs. If you do have throw rugs, attach them to the floor with carpet tape.  Make sure that you have a light switch at the top of the stairs and the bottom of the stairs. If you do not have them, ask someone to add them for you. What else can I do to help prevent falls?  Wear shoes that:  Do not have high heels.  Have rubber bottoms.  Are comfortable and fit you well.  Are closed at the toe. Do not wear sandals.  If you use a stepladder:  Make sure that it is fully opened. Do not climb a closed stepladder.  Make sure that both sides of the stepladder are locked into place.  Ask  someone to hold it for you, if possible.  Clearly mark and make sure that you can see:  Any grab bars or handrails.  First and last steps.  Where the edge of each step is.  Use tools that help you move around (mobility aids) if they are needed. These include:  Canes.  Walkers.  Scooters.  Crutches.  Turn on the lights when you go into a dark area. Replace any light bulbs as soon as they burn out.  Set up your furniture so you have a clear path. Avoid moving your furniture around.  If any of your floors are uneven, fix them.  If there are any pets around you, be aware of where they are.  Review your medicines with your doctor. Some medicines can make you feel dizzy. This can increase your chance of falling. Ask your doctor what other things that you can do to help prevent falls. This information is not intended to replace advice given to you by your health care provider. Make sure you discuss any questions you have with your health care provider. Document Released: 04/04/2009 Document Revised: 11/14/2015 Document Reviewed: 07/13/2014 Elsevier Interactive Patient Education  2017 [employer].

## 2019-06-21 NOTE — Progress Notes (Signed)
This visit occurred during the SARS-CoV-2 public health emergency.  Safety protocols were in place, including screening questions prior to the visit, additional usage of staff PPE, and extensive cleaning of exam room while observing appropriate contact time as indicated for disinfecting solutions.  Subjective:     Patient ID: Maria Hall , female    DOB: 1951/01/19 , 68 y.o.   MRN: [id number]   Chief Complaint  Patient presents with  . Annual Exam    HPI  Here for HM  Urinary hesitancy - will feel pressure to urinate then can not go, no frequency. Denies burning on urination.  She does not drink a lot of water daily.     The patient states she has had a hysterectomy.  Negative for Dysmenorrhea and Negative for Menorrhagia Mammogram last done 05/19/2019.  Negative for: breast discharge, breast lump(s), breast pain and breast self exam.  Pertinent negatives include abnormal bleeding (hematology), anxiety, decreased libido, depression, difficulty falling sleep, dyspareunia, history of infertility, nocturia, sexual dysfunction, sleep disturbances, urinary incontinence, urinary urgency, vaginal discharge and vaginal itching. Diet regular. The patient states her exercise level is none, was going to the [facility]  Was walking but now cold.       The patient's tobacco use is:  Social History   Tobacco Use  Smoking Status Former Smoker  . Packs/day: 1.00  . Years: 20.00  . Pack years: 20.00  . Types: Cigarettes  . Quit date: 83  . Years since quitting: 31.0  Smokeless Tobacco Never Used   She has been exposed to passive smoke. The patient's alcohol use is:  Social History   Substance and Sexual Activity  Alcohol Use Not Currently   Additional information: Last pap hysterectomy.   Past Medical History:  Diagnosis Date  . 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 Bronchitis    gets every year in the winter  . Insomnia    takes meds  . Right rotator cuff tear   . Swelling     in hands and ankles  . Thyroid disease      Family History  Adopted: Yes     Current Outpatient Medications:  .  acetaminophen (ARTHRITIS PAIN) 650 MG CR tablet, Take 650 mg by mouth. Take 2 caplets daily, Disp: , Rfl:  .  albuterol (VENTOLIN HFA) 108 (90 Base) MCG/ACT inhaler, INHALE 2 PUFFS BY MOUTH EVERY 4 TO 6 HOURS AS NEEDED, Disp: 6.7 g, Rfl: 3 .  Ascorbic Acid (VITAMIN C) 1000 MG tablet, Take 1,000 mg by mouth daily., Disp: , Rfl:  .  aspirin EC 81 MG tablet, Take 81 mg by mouth daily., Disp: , Rfl:  .  cholecalciferol (VITAMIN D) 1000 units tablet, Take 1,000 Units by mouth daily., Disp: , Rfl:  .  cyclobenzaprine (FLEXERIL) 5 MG tablet, Take 1 tablet (5 mg total) by mouth at bedtime as needed for muscle spasms., Disp: 30 tablet, Rfl: 0 .  Docusate Calcium (STOOL SOFTENER PO), Take 100 mg by mouth. Take 2 soft gels daily, Disp: , Rfl:  .  fluticasone (FLONASE) 50 MCG/ACT nasal spray, SPRAY 1 SPRAY INTO EACH NOSTRIL EVERY DAY, Disp: 48 mL, Rfl: 1 .  folic acid (FOLVITE) 1 MG tablet, TAKE 1 TABLET BY MOUTH EVERY DAY, Disp: 90 tablet, Rfl: 0 .  hydrochlorothiazide (HYDRODIURIL) 12.5 MG tablet, TAKE 1 TABLET BY MOUTH EVERY DAY, Disp: 90 tablet, Rfl: 1 .  levothyroxine (SYNTHROID) 88 MCG tablet, TAKE 1 TABLET (88 MCG TOTAL)  BY MOUTH DAILY BEFORE BREAKFAST. MON - SAT ONLY, Disp: 78 tablet, Rfl: 1 .  Omega 3-6-9 Fatty Acids (OMEGA 3-6-9 COMPLEX PO), Take 1,600 mg by mouth daily at 6 (six) AM., Disp: , Rfl:  .  vitamin E 400 UNIT capsule, Take 400 Units by mouth daily., Disp: , Rfl:    No Known Allergies   Review of Systems  Constitutional: Negative.   HENT: Negative.   Eyes: Negative.   Respiratory: Negative.   Cardiovascular: Negative.  Negative for chest pain, palpitations and leg swelling.  Gastrointestinal: Negative.   Endocrine: Negative.   Genitourinary: Negative.   Musculoskeletal: Negative.        Feels bilateral hip discomfort.    Skin: Negative.    Allergic/Immunologic: Negative.   Neurological: Negative.  Negative for dizziness and headaches.  Hematological: Negative.   Psychiatric/Behavioral: Negative.      Today's Vitals   06/21/19 1126  BP: 100/62  Pulse: 92  Temp: 98.2 F (36.8 C)  TempSrc: Oral  Weight: 158 lb (71.7 kg)  Height: 5' 4.6" (1.641 m)  PainSc: 0-No pain   Body mass index is 26.62 kg/m.   Objective:  Physical Exam Constitutional:      General: She is not in acute distress.    Appearance: Normal appearance. She is well-developed.  HENT:     Head: Normocephalic and atraumatic.     Right Ear: Hearing, tympanic membrane, ear canal and external ear normal. There is no impacted cerumen.     Left Ear: Hearing, tympanic membrane, ear canal and external ear normal. There is no impacted cerumen.     Nose: Nose normal.     Mouth/Throat:     Mouth: Mucous membranes are moist.  Eyes:     General: Lids are normal.     Conjunctiva/sclera: Conjunctivae normal.     Pupils: Pupils are equal, round, and reactive to light.     Funduscopic exam:    Right eye: No papilledema.        Left eye: No papilledema.  Neck:     Thyroid: No thyroid mass.     Vascular: No carotid bruit.  Cardiovascular:     Rate and Rhythm: Normal rate and regular rhythm.     Pulses: Normal pulses.     Heart sounds: Normal heart sounds. No murmur.  Pulmonary:     Effort: Pulmonary effort is normal. No respiratory distress.     Breath sounds: Normal breath sounds.  Abdominal:     General: Abdomen is flat. Bowel sounds are normal.     Palpations: Abdomen is soft.  Musculoskeletal:        General: No swelling. Normal range of motion.     Cervical back: Full passive range of motion without pain, normal range of motion and neck supple.     Right lower leg: No edema.     Left lower leg: No edema.  Skin:    General: Skin is warm and dry.     Capillary Refill: Capillary refill takes less than 2 seconds.  Neurological:     General: No  focal deficit present.     Mental Status: She is alert and oriented to person, place, and time.     Cranial Nerves: No cranial nerve deficit.     Sensory: No sensory deficit.  Psychiatric:        Mood and Affect: Mood normal.        Behavior: Behavior normal.        Thought  Content: Thought content normal.        Judgment: Judgment normal.           Assessment And Plan:   1. Other abnormal glucose  Chronic, stable  Will check HgbA1c  Encourage to continue to eat a healthy diet low in sugar, starches and exercise regularly - CMP14+EGFR - Hemoglobin A1c  2. Primary hypothyroidism Chronic, controlled Continue with current medications  3. Urinary hesitancy  Will occasionally have low abdominal pressure  Urinalysis is normal  Encouraged to increase water intake and add lemon or drink cranberry juice - POCT Urinalysis Dipstick (81002)  4. Insomnia, unspecified type  Chronic, stable  Continue with trazodone - traZODone (DESYREL) 100 MG tablet; Take 1 tablet (100 mg total) by mouth at bedtime.  Dispense: 30 tablet; Refill: 3  5. Health maintenance examination . Behavior modifications discussed and diet history reviewed.   . Pt will continue to exercise regularly and modify diet with low GI, plant based foods and decrease intake of processed foods.  . Recommend intake of daily multivitamin, Vitamin D, and calcium.  . Recommend for preventive screenings, as well as recommend immunizations that include influenza, TDAP (immunizations are up to date)   Minette Brine, FNP    THE PATIENT IS ENCOURAGED TO PRACTICE SOCIAL DISTANCING DUE TO THE COVID-19 PANDEMIC.

## 2019-06-22 LAB — CMP14+EGFR
ALT: 15 IU/L (ref 0–32)
AST: 22 IU/L (ref 0–40)
Albumin/Globulin Ratio: 1.7 (ref 1.2–2.2)
Albumin: 4.2 g/dL (ref 3.8–4.8)
Alkaline Phosphatase: 146 IU/L — ABNORMAL HIGH (ref 39–117)
BUN/Creatinine Ratio: 10 — ABNORMAL LOW (ref 12–28)
BUN: 9 mg/dL (ref 8–27)
Bilirubin Total: 0.3 mg/dL (ref 0.0–1.2)
CO2: 29 mmol/L (ref 20–29)
Calcium: 10.2 mg/dL (ref 8.7–10.3)
Chloride: 97 mmol/L (ref 96–106)
Creatinine, Ser: 0.94 mg/dL (ref 0.57–1.00)
GFR calc Af Amer: 71 mL/min/{1.73_m2} (ref >59)
GFR calc non Af Amer: 62 mL/min/{1.73_m2} (ref >59)
Globulin, Total: 2.5 g/dL (ref 1.5–4.5)
Glucose: 71 mg/dL (ref 65–99)
Potassium: 3.5 mmol/L (ref 3.5–5.2)
Sodium: 143 mmol/L (ref 134–144)
Total Protein: 6.7 g/dL (ref 6.0–8.5)

## 2019-06-22 LAB — HEMOGLOBIN A1C
Est. average glucose Bld gHb Est-mCnc: 114 mg/dL
Hgb A1c MFr Bld: 5.6 % (ref 4.8–5.6)

## 2019-06-24 ENCOUNTER — Other Ambulatory Visit: Payer: Self-pay | Admitting: Internal Medicine

## 2019-06-24 ENCOUNTER — Other Ambulatory Visit: Payer: Self-pay | Admitting: Hematology & Oncology

## 2019-06-24 DIAGNOSIS — D473 Essential (hemorrhagic) thrombocythemia: Secondary | ICD-10-CM

## 2019-06-24 DIAGNOSIS — D573 Sickle-cell trait: Secondary | ICD-10-CM

## 2019-06-26 MED ORDER — FOLIC ACID 1 MG PO TABS: 1.0000 mg | ORAL_TABLET | Freq: Every day | ORAL | 0 refills | Status: DC

## 2019-07-10 DIAGNOSIS — M9903 Segmental and somatic dysfunction of lumbar region: Secondary | ICD-10-CM | POA: Diagnosis not present

## 2019-07-10 DIAGNOSIS — M47814 Spondylosis without myelopathy or radiculopathy, thoracic region: Secondary | ICD-10-CM | POA: Diagnosis not present

## 2019-07-10 DIAGNOSIS — M9902 Segmental and somatic dysfunction of thoracic region: Secondary | ICD-10-CM | POA: Diagnosis not present

## 2019-07-10 DIAGNOSIS — M546 Pain in thoracic spine: Secondary | ICD-10-CM | POA: Diagnosis not present

## 2019-07-11 ENCOUNTER — Telehealth: Payer: Self-pay

## 2019-07-11 NOTE — Telephone Encounter (Signed)
Patient called wanting to know if we are giving the covid vaccine I returned her call and provided the pt with the number to the health department so she can schedule an appt. YRL,RMA

## 2019-07-17 DIAGNOSIS — M9903 Segmental and somatic dysfunction of lumbar region: Secondary | ICD-10-CM | POA: Diagnosis not present

## 2019-07-17 DIAGNOSIS — M546 Pain in thoracic spine: Secondary | ICD-10-CM | POA: Diagnosis not present

## 2019-07-17 DIAGNOSIS — M9902 Segmental and somatic dysfunction of thoracic region: Secondary | ICD-10-CM | POA: Diagnosis not present

## 2019-07-17 DIAGNOSIS — M47814 Spondylosis without myelopathy or radiculopathy, thoracic region: Secondary | ICD-10-CM | POA: Diagnosis not present

## 2019-07-31 DIAGNOSIS — M47817 Spondylosis without myelopathy or radiculopathy, lumbosacral region: Secondary | ICD-10-CM | POA: Diagnosis not present

## 2019-07-31 DIAGNOSIS — M5431 Sciatica, right side: Secondary | ICD-10-CM | POA: Diagnosis not present

## 2019-07-31 DIAGNOSIS — M545 Low back pain: Secondary | ICD-10-CM | POA: Diagnosis not present

## 2019-07-31 DIAGNOSIS — M9903 Segmental and somatic dysfunction of lumbar region: Secondary | ICD-10-CM | POA: Diagnosis not present

## 2019-08-16 DIAGNOSIS — M5431 Sciatica, right side: Secondary | ICD-10-CM | POA: Diagnosis not present

## 2019-08-16 DIAGNOSIS — M9903 Segmental and somatic dysfunction of lumbar region: Secondary | ICD-10-CM | POA: Diagnosis not present

## 2019-08-16 DIAGNOSIS — M47817 Spondylosis without myelopathy or radiculopathy, lumbosacral region: Secondary | ICD-10-CM | POA: Diagnosis not present

## 2019-08-16 DIAGNOSIS — M545 Low back pain: Secondary | ICD-10-CM | POA: Diagnosis not present

## 2019-08-28 DIAGNOSIS — M545 Low back pain: Secondary | ICD-10-CM | POA: Diagnosis not present

## 2019-08-28 DIAGNOSIS — M5431 Sciatica, right side: Secondary | ICD-10-CM | POA: Diagnosis not present

## 2019-08-28 DIAGNOSIS — M47817 Spondylosis without myelopathy or radiculopathy, lumbosacral region: Secondary | ICD-10-CM | POA: Diagnosis not present

## 2019-08-28 DIAGNOSIS — M9903 Segmental and somatic dysfunction of lumbar region: Secondary | ICD-10-CM | POA: Diagnosis not present

## 2019-09-07 ENCOUNTER — Other Ambulatory Visit: Payer: Self-pay

## 2019-09-07 ENCOUNTER — Encounter: Payer: Self-pay | Admitting: Nurse Practitioner

## 2019-09-07 ENCOUNTER — Ambulatory Visit (INDEPENDENT_AMBULATORY_CARE_PROVIDER_SITE_OTHER): Payer: Medicare Other | Admitting: Nurse Practitioner

## 2019-09-07 VITALS — BP 112/70 | HR 73 | Temp 98.4°F | Ht 65.0 in | Wt 163.8 lb

## 2019-09-07 DIAGNOSIS — L03818 Cellulitis of other sites: Secondary | ICD-10-CM | POA: Diagnosis not present

## 2019-09-07 DIAGNOSIS — W57XXXA Bitten or stung by nonvenomous insect and other nonvenomous arthropods, initial encounter: Secondary | ICD-10-CM

## 2019-09-07 DIAGNOSIS — N63 Unspecified lump in unspecified breast: Secondary | ICD-10-CM | POA: Diagnosis not present

## 2019-09-07 MED ORDER — DOXYCYCLINE MONOHYDRATE 100 MG PO CAPS: 100.0000 mg | ORAL_CAPSULE | Freq: Two times a day (BID) | ORAL | 0 refills | Status: DC

## 2019-09-07 MED ORDER — TRIAMCINOLONE ACETONIDE 40 MG/ML IJ SUSP
40.0000 mg | Freq: Once | INTRAMUSCULAR | Status: AC
  Administered 2019-09-07: 40 mg via INTRAMUSCULAR

## 2019-09-07 NOTE — Patient Instructions (Signed)
   If the discomfort and lump on the left side of your breast is not better after taking the antibiotic return call to office to possibly order imaging

## 2019-09-07 NOTE — Progress Notes (Signed)
This visit occurred during the SARS-CoV-2 public health emergency.  Safety protocols were in place, including screening questions prior to the visit, additional usage of staff PPE, and extensive cleaning of exam room while observing appropriate contact time as indicated for disinfecting solutions.  Subjective:     Patient ID: Maria Hall , female    DOB: Nov 06, 1950 , 69 y.o.   MRN: [id number]   Chief Complaint  Patient presents with  . Insect Bite    patient got bit by a tick she stated she is not sure if it is a lump or just from where she was bit she stated it is very tender and itchy    HPI  She was bit by a tic a few days ago, thought was a mole ended up being a tick. She felt her left breast and felt a lump.  No fever.  Denies joint pain other than mild neck discomfort.  She had been outside on Tuesday - walking around in the yard around multiple trees. She is having pain and itching to the area.     Past Medical History:  Diagnosis Date  . 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 Bronchitis    gets every year in the winter  . Insomnia    takes meds  . Right rotator cuff tear   . Swelling    in hands and ankles  . Thyroid disease      Family History  Adopted: Yes     Current Outpatient Medications:  .  acetaminophen (ARTHRITIS PAIN) 650 MG CR tablet, Take 650 mg by mouth. Take 2 caplets daily, Disp: , Rfl:  .  albuterol (VENTOLIN HFA) 108 (90 Base) MCG/ACT inhaler, INHALE 2 PUFFS BY MOUTH EVERY 4 TO 6 HOURS AS NEEDED, Disp: 6.7 g, Rfl: 3 .  Ascorbic Acid (VITAMIN C) 1000 MG tablet, Take 1,000 mg by mouth daily., Disp: , Rfl:  .  aspirin EC 81 MG tablet, Take 81 mg by mouth daily., Disp: , Rfl:  .  cholecalciferol (VITAMIN D) 1000 units tablet, Take 1,000 Units by mouth daily., Disp: , Rfl:  .  Docusate Calcium (STOOL SOFTENER PO), Take 100 mg by mouth. Take 2 soft gels daily, Disp: , Rfl:  .  fluticasone (FLONASE) 50 MCG/ACT nasal spray, Use 1 spray each nostril  once a day, Disp: 48 mL, Rfl: 1 .  folic acid (FOLVITE) 1 MG tablet, Take 1 tablet (1 mg total) by mouth daily., Disp: 90 tablet, Rfl: 0 .  hydrochlorothiazide (HYDRODIURIL) 12.5 MG tablet, TAKE 1 TABLET BY MOUTH EVERY DAY, Disp: 90 tablet, Rfl: 1 .  levothyroxine (SYNTHROID) 88 MCG tablet, TAKE 1 TABLET (88 MCG TOTAL) BY MOUTH DAILY BEFORE BREAKFAST. MON - SAT ONLY, Disp: 78 tablet, Rfl: 1 .  Omega 3-6-9 Fatty Acids (OMEGA 3-6-9 COMPLEX PO), Take 1,600 mg by mouth daily at 6 (six) AM., Disp: , Rfl:  .  traZODone (DESYREL) 100 MG tablet, Take 1 tablet (100 mg total) by mouth at bedtime., Disp: 30 tablet, Rfl: 3 .  vitamin E 400 UNIT capsule, Take 400 Units by mouth daily., Disp: , Rfl:  .  cyclobenzaprine (FLEXERIL) 5 MG tablet, TAKE 1 TABLET (5 MG TOTAL) BY MOUTH AT BEDTIME AS NEEDED FOR MUSCLE SPASMS. (Patient not taking: Reported on 09/07/2019), Disp: 30 tablet, Rfl: 0   No Known Allergies   Review of Systems   Today's Vitals   09/07/19 1423  BP: 112/70  Pulse: 73  Temp:  98.4 F (36.9 C)  TempSrc: Oral  Weight: 163 lb 12.8 oz (74.3 kg)  Height: 5\' 5"  (1.651 m)  PainSc: 7    Body mass index is 27.26 kg/m.   Objective:  Physical Exam      Assessment And Plan:     1. Cellulitis of other specified site  Erythema to left lateral chest and abdomen with noted tick bite marks  Will treat with kenalog for the itching and doxycycline if this is related to tick borne disease - triamcinolone acetonide (KENALOG-40) injection 40 mg - doxycycline (MONODOX) 100 MG capsule; Take 1 capsule (100 mg total) by mouth 2 (two) times daily.  Dispense: 42 capsule; Refill: 0  2. Tick bite, initial encounter  2 areas noted of bite marks, tick was removed earlier this week  3. Lump, breast  Slightly firm palpable lump to left lateral breast with tenderness.   Will treat for possible cellulitis/infection, if this is not better at the completion of her antibiotic therapy will send for a  diagnostic mammogram   Minette Brine, FNP    THE PATIENT IS ENCOURAGED TO PRACTICE SOCIAL DISTANCING DUE TO THE COVID-19 PANDEMIC.

## 2019-09-20 ENCOUNTER — Other Ambulatory Visit: Payer: Self-pay | Admitting: Nurse Practitioner

## 2019-09-20 DIAGNOSIS — L03818 Cellulitis of other sites: Secondary | ICD-10-CM

## 2019-10-24 DIAGNOSIS — M545 Low back pain: Secondary | ICD-10-CM | POA: Diagnosis not present

## 2019-10-24 DIAGNOSIS — M542 Cervicalgia: Secondary | ICD-10-CM | POA: Diagnosis not present

## 2019-10-24 DIAGNOSIS — M47817 Spondylosis without myelopathy or radiculopathy, lumbosacral region: Secondary | ICD-10-CM | POA: Diagnosis not present

## 2019-10-24 DIAGNOSIS — M9901 Segmental and somatic dysfunction of cervical region: Secondary | ICD-10-CM | POA: Diagnosis not present

## 2019-10-26 DIAGNOSIS — M47817 Spondylosis without myelopathy or radiculopathy, lumbosacral region: Secondary | ICD-10-CM | POA: Diagnosis not present

## 2019-10-26 DIAGNOSIS — M9901 Segmental and somatic dysfunction of cervical region: Secondary | ICD-10-CM | POA: Diagnosis not present

## 2019-10-26 DIAGNOSIS — M545 Low back pain: Secondary | ICD-10-CM | POA: Diagnosis not present

## 2019-10-26 DIAGNOSIS — M542 Cervicalgia: Secondary | ICD-10-CM | POA: Diagnosis not present

## 2019-10-27 ENCOUNTER — Other Ambulatory Visit: Payer: Self-pay | Admitting: Nurse Practitioner

## 2019-10-27 DIAGNOSIS — G4700 Insomnia, unspecified: Secondary | ICD-10-CM

## 2019-10-27 NOTE — Telephone Encounter (Signed)
Refill/change request Trazodone 100MG 

## 2019-10-30 DIAGNOSIS — M9901 Segmental and somatic dysfunction of cervical region: Secondary | ICD-10-CM | POA: Diagnosis not present

## 2019-10-30 DIAGNOSIS — M542 Cervicalgia: Secondary | ICD-10-CM | POA: Diagnosis not present

## 2019-10-30 DIAGNOSIS — M47817 Spondylosis without myelopathy or radiculopathy, lumbosacral region: Secondary | ICD-10-CM | POA: Diagnosis not present

## 2019-10-30 DIAGNOSIS — M545 Low back pain: Secondary | ICD-10-CM | POA: Diagnosis not present

## 2019-11-01 DIAGNOSIS — M47817 Spondylosis without myelopathy or radiculopathy, lumbosacral region: Secondary | ICD-10-CM | POA: Diagnosis not present

## 2019-11-01 DIAGNOSIS — M542 Cervicalgia: Secondary | ICD-10-CM | POA: Diagnosis not present

## 2019-11-01 DIAGNOSIS — M545 Low back pain: Secondary | ICD-10-CM | POA: Diagnosis not present

## 2019-11-01 DIAGNOSIS — M9901 Segmental and somatic dysfunction of cervical region: Secondary | ICD-10-CM | POA: Diagnosis not present

## 2019-11-06 DIAGNOSIS — M545 Low back pain: Secondary | ICD-10-CM | POA: Diagnosis not present

## 2019-11-06 DIAGNOSIS — M9901 Segmental and somatic dysfunction of cervical region: Secondary | ICD-10-CM | POA: Diagnosis not present

## 2019-11-06 DIAGNOSIS — M47817 Spondylosis without myelopathy or radiculopathy, lumbosacral region: Secondary | ICD-10-CM | POA: Diagnosis not present

## 2019-11-06 DIAGNOSIS — M542 Cervicalgia: Secondary | ICD-10-CM | POA: Diagnosis not present

## 2019-11-08 DIAGNOSIS — M542 Cervicalgia: Secondary | ICD-10-CM | POA: Diagnosis not present

## 2019-11-08 DIAGNOSIS — M545 Low back pain: Secondary | ICD-10-CM | POA: Diagnosis not present

## 2019-11-08 DIAGNOSIS — M9901 Segmental and somatic dysfunction of cervical region: Secondary | ICD-10-CM | POA: Diagnosis not present

## 2019-11-08 DIAGNOSIS — M47817 Spondylosis without myelopathy or radiculopathy, lumbosacral region: Secondary | ICD-10-CM | POA: Diagnosis not present

## 2019-11-13 DIAGNOSIS — M542 Cervicalgia: Secondary | ICD-10-CM | POA: Diagnosis not present

## 2019-11-13 DIAGNOSIS — M545 Low back pain: Secondary | ICD-10-CM | POA: Diagnosis not present

## 2019-11-13 DIAGNOSIS — M47817 Spondylosis without myelopathy or radiculopathy, lumbosacral region: Secondary | ICD-10-CM | POA: Diagnosis not present

## 2019-11-13 DIAGNOSIS — M9901 Segmental and somatic dysfunction of cervical region: Secondary | ICD-10-CM | POA: Diagnosis not present

## 2019-11-15 DIAGNOSIS — M9901 Segmental and somatic dysfunction of cervical region: Secondary | ICD-10-CM | POA: Diagnosis not present

## 2019-11-15 DIAGNOSIS — M542 Cervicalgia: Secondary | ICD-10-CM | POA: Diagnosis not present

## 2019-11-15 DIAGNOSIS — M47817 Spondylosis without myelopathy or radiculopathy, lumbosacral region: Secondary | ICD-10-CM | POA: Diagnosis not present

## 2019-11-15 DIAGNOSIS — M545 Low back pain: Secondary | ICD-10-CM | POA: Diagnosis not present

## 2019-11-22 DIAGNOSIS — M545 Low back pain: Secondary | ICD-10-CM | POA: Diagnosis not present

## 2019-11-22 DIAGNOSIS — M9901 Segmental and somatic dysfunction of cervical region: Secondary | ICD-10-CM | POA: Diagnosis not present

## 2019-11-22 DIAGNOSIS — M47817 Spondylosis without myelopathy or radiculopathy, lumbosacral region: Secondary | ICD-10-CM | POA: Diagnosis not present

## 2019-11-22 DIAGNOSIS — M542 Cervicalgia: Secondary | ICD-10-CM | POA: Diagnosis not present

## 2019-11-23 DIAGNOSIS — H1045 Other chronic allergic conjunctivitis: Secondary | ICD-10-CM | POA: Diagnosis not present

## 2019-11-23 DIAGNOSIS — H52203 Unspecified astigmatism, bilateral: Secondary | ICD-10-CM | POA: Diagnosis not present

## 2019-11-23 DIAGNOSIS — H5203 Hypermetropia, bilateral: Secondary | ICD-10-CM | POA: Diagnosis not present

## 2019-11-23 DIAGNOSIS — H2513 Age-related nuclear cataract, bilateral: Secondary | ICD-10-CM | POA: Diagnosis not present

## 2019-11-23 DIAGNOSIS — H524 Presbyopia: Secondary | ICD-10-CM | POA: Diagnosis not present

## 2019-12-02 ENCOUNTER — Other Ambulatory Visit: Payer: Self-pay | Admitting: Hematology & Oncology

## 2019-12-02 DIAGNOSIS — D473 Essential (hemorrhagic) thrombocythemia: Secondary | ICD-10-CM

## 2019-12-02 DIAGNOSIS — D573 Sickle-cell trait: Secondary | ICD-10-CM

## 2019-12-03 ENCOUNTER — Other Ambulatory Visit: Payer: Self-pay | Admitting: Internal Medicine

## 2019-12-05 ENCOUNTER — Other Ambulatory Visit: Payer: Self-pay | Admitting: Internal Medicine

## 2019-12-10 ENCOUNTER — Other Ambulatory Visit: Payer: Self-pay | Admitting: Nurse Practitioner

## 2019-12-20 ENCOUNTER — Ambulatory Visit (INDEPENDENT_AMBULATORY_CARE_PROVIDER_SITE_OTHER): Payer: Medicare Other | Admitting: Internal Medicine

## 2019-12-20 ENCOUNTER — Encounter: Payer: Self-pay | Admitting: Internal Medicine

## 2019-12-20 ENCOUNTER — Other Ambulatory Visit: Payer: Self-pay

## 2019-12-20 VITALS — BP 124/68 | HR 67 | Temp 98.5°F | Ht 64.6 in | Wt 155.2 lb

## 2019-12-20 DIAGNOSIS — I10 Essential (primary) hypertension: Secondary | ICD-10-CM | POA: Diagnosis not present

## 2019-12-20 DIAGNOSIS — E039 Hypothyroidism, unspecified: Secondary | ICD-10-CM | POA: Diagnosis not present

## 2019-12-20 DIAGNOSIS — M5416 Radiculopathy, lumbar region: Secondary | ICD-10-CM | POA: Diagnosis not present

## 2019-12-20 MED ORDER — METHOCARBAMOL 500 MG PO TABS: 500.0000 mg | ORAL_TABLET | Freq: Three times a day (TID) | ORAL | 0 refills | Status: DC | PRN

## 2019-12-20 MED ORDER — KETOROLAC TROMETHAMINE 30 MG/ML IJ SOLN
30.0000 mg | Freq: Once | INTRAMUSCULAR | Status: AC
  Administered 2019-12-20: 30 mg via INTRAMUSCULAR

## 2019-12-20 MED ORDER — HYDROCODONE-ACETAMINOPHEN 5-325 MG PO TABS: 1.0000 | ORAL_TABLET | Freq: Four times a day (QID) | ORAL | 0 refills | Status: DC | PRN

## 2019-12-20 NOTE — Patient Instructions (Signed)
Radicular Pain Radicular pain is a type of pain that spreads from your back or neck along a spinal nerve. Spinal nerves are nerves that leave the spinal cord and go to the muscles. Radicular pain is sometimes called radiculopathy, radiculitis, or a pinched nerve. When you have this type of pain, you may also have weakness, numbness, or tingling in the area of your body that is supplied by the nerve. The pain may feel sharp and burning. Depending on which spinal nerve is affected, the pain may occur in the:  Neck area (cervical radicular pain). You may also feel pain, numbness, weakness, or tingling in the arms.  Mid-spine area (thoracic radicular pain). You would feel this pain in the back and chest. This type is rare.  Lower back area (lumbar radicular pain). You would feel this pain as low back pain. You may feel pain, numbness, weakness, or tingling in the buttocks or legs. Sciatica is a type of lumbar radicular pain that shoots down the back of the leg. Radicular pain occurs when one of the spinal nerves becomes irritated or squeezed (compressed). It is often caused by something pushing on a spinal nerve, such as one of the bones of the spine (vertebrae) or one of the round cushions between vertebrae (intervertebral disks). This can result from:  An injury.  Wear and tear or aging of a disk.  The growth of a bone spur that pushes on the nerve. Radicular pain often goes away when you follow instructions from your health care provider for relieving pain at home. Follow these instructions at home: Managing pain      If directed, put ice on the affected area: ? Put ice in a plastic bag. ? Place a towel between your skin and the bag. ? Leave the ice on for 20 minutes, 2-3 times a day.  If directed, apply heat to the affected area as often as told by your health care provider. Use the heat source that your health care provider recommends, such as a moist heat pack or a heating pad. ? Place  a towel between your skin and the heat source. ? Leave the heat on for 20-30 minutes. ? Remove the heat if your skin turns bright red. This is especially important if you are unable to feel pain, heat, or cold. You may have a greater risk of getting burned. Activity   Do not sit or rest in bed for long periods of time.  Try to stay as active as possible. Ask your health care provider what type of exercise or activity is best for you.  Avoid activities that make your pain worse, such as bending and lifting.  Do not lift anything that is heavier than 10 lb (4.5 kg), or the limit that you are told, until your health care provider says that it is safe.  Practice using proper technique when lifting items. Proper lifting technique involves bending your knees and rising up.  Do strength and range-of-motion exercises only as told by your health care provider or physical therapist. General instructions  Take over-the-counter and prescription medicines only as told by your health care provider.  Pay attention to any changes in your symptoms.  Keep all follow-up visits as told by your health care provider. This is important. ? Your health care provider may send you to a physical therapist to help with this pain. Contact a health care provider if:  Your pain and other symptoms get worse.  Your pain medicine is not   helping.  Your pain has not improved after a few weeks of home care.  You have a fever. Get help right away if:  You have severe pain, weakness, or numbness.  You have difficulty with bladder or bowel control. Summary  Radicular pain is a type of pain that spreads from your back or neck along a spinal nerve.  When you have radicular pain, you may also have weakness, numbness, or tingling in the area of your body that is supplied by the nerve.  The pain may feel sharp or burning.  Radicular pain may be treated with ice, heat, medicines, or physical therapy. This  information is not intended to replace advice given to you by your health care provider. Make sure you discuss any questions you have with your health care provider. Document Revised: 12/21/2017 Document Reviewed: 12/21/2017 Elsevier Patient Education  2020 [employer]  

## 2019-12-22 LAB — BASIC METABOLIC PANEL WITH GFR
BUN/Creatinine Ratio: 8 (calc) (ref 6–22)
BUN: 9 mg/dL (ref 7–25)
CO2: 35 mmol/L — ABNORMAL HIGH (ref 20–32)
Calcium: 10 mg/dL (ref 8.6–10.4)
Chloride: 101 mmol/L (ref 98–110)
Creat: 1.15 mg/dL — ABNORMAL HIGH (ref 0.60–0.93)
GFR, Est African American: 56 mL/min/{1.73_m2} — ABNORMAL LOW (ref >=60)
GFR, Est Non African American: 48 mL/min/{1.73_m2} — ABNORMAL LOW (ref >=60)
Glucose, Bld: 87 mg/dL (ref 65–99)
Potassium: 3.7 mmol/L (ref 3.5–5.3)
Sodium: 142 mmol/L (ref 135–146)

## 2019-12-22 LAB — T4, FREE: Free T4: 1.1 ng/dL (ref 0.8–1.8)

## 2019-12-22 LAB — TSH: TSH: 1.2 mIU/L (ref 0.40–4.50)

## 2019-12-23 ENCOUNTER — Other Ambulatory Visit: Payer: Self-pay | Admitting: Nurse Practitioner

## 2019-12-23 DIAGNOSIS — L03818 Cellulitis of other sites: Secondary | ICD-10-CM

## 2019-12-31 NOTE — Progress Notes (Addendum)
This visit occurred during the SARS-CoV-2 public health emergency.  Safety protocols were in place, including screening questions prior to the visit, additional usage of staff PPE, and extensive cleaning of exam room while observing appropriate contact time as indicated for disinfecting solutions.  Subjective:     Patient ID: Maria Hall , female    DOB: 05/05/1951 , 69 y.o.   MRN: [id number]   Chief Complaint  Patient presents with  . Hypertension  . Hypothyroidism    HPI  Hypertension This is a chronic problem. The current episode started more than 1 year ago. The problem has been gradually improving since onset. The problem is controlled. Pertinent negatives include no blurred vision, chest pain, palpitations, peripheral edema or shortness of breath. Past treatments include diuretics. The current treatment provides moderate improvement. There are no compliance problems.  Identifiable causes of hypertension include a thyroid problem.  Thyroid Problem Presents for follow-up visit. Patient reports no palpitations. The symptoms have been stable.     Past Medical History:  Diagnosis Date  . 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 Bronchitis    gets every year in the winter  . Insomnia    takes meds  . Right rotator cuff tear   . Swelling    in hands and ankles  . Thyroid disease      Family History  Adopted: Yes     Current Outpatient Medications:  .  acetaminophen (ARTHRITIS PAIN) 650 MG CR tablet, Take 650 mg by mouth. Take 2 caplets daily, Disp: , Rfl:  .  albuterol (VENTOLIN HFA) 108 (90 Base) MCG/ACT inhaler, INHALE 2 PUFFS BY MOUTH EVERY 4 TO 6 HOURS AS NEEDED, Disp: 6.7 g, Rfl: 3 .  Ascorbic Acid (VITAMIN C) 1000 MG tablet, Take 1,000 mg by mouth daily., Disp: , Rfl:  .  aspirin EC 81 MG tablet, Take 81 mg by mouth daily., Disp: , Rfl:  .  cholecalciferol (VITAMIN D) 1000 units tablet, Take 1,000 Units by mouth daily., Disp: , Rfl:  .  cyclobenzaprine  (FLEXERIL) 5 MG tablet, TAKE 1 TABLET (5 MG TOTAL) BY MOUTH AT BEDTIME AS NEEDED FOR MUSCLE SPASMS., Disp: 30 tablet, Rfl: 0 .  Docusate Calcium (STOOL SOFTENER PO), Take 100 mg by mouth. Take 2 soft gels daily, Disp: , Rfl:  .  fluticasone (FLONASE) 50 MCG/ACT nasal spray, USE 1 SPRAY EACH NOSTRIL ONCE A DAY, Disp: 48 mL, Rfl: 1 .  folic acid (FOLVITE) 1 MG tablet, TAKE 1 TABLET BY MOUTH EVERY DAY, Disp: 90 tablet, Rfl: 0 .  hydrochlorothiazide (HYDRODIURIL) 12.5 MG tablet, TAKE 1 TABLET BY MOUTH EVERY DAY, Disp: 90 tablet, Rfl: 1 .  HYDROcodone-acetaminophen (NORCO/VICODIN) 5-325 MG tablet, Take 1 tablet by mouth every 6 (six) hours as needed for moderate pain., Disp: 30 tablet, Rfl: 0 .  levothyroxine (SYNTHROID) 88 MCG tablet, TAKE 1 TABLET (88 MCG TOTAL) BY MOUTH DAILY BEFORE BREAKFAST. MON - SAT ONLY, Disp: 78 tablet, Rfl: 1 .  methocarbamol (ROBAXIN) 500 MG tablet, Take 1 tablet (500 mg total) by mouth every 8 (eight) hours as needed for muscle spasms., Disp: 40 tablet, Rfl: 0 .  Omega 3-6-9 Fatty Acids (OMEGA 3-6-9 COMPLEX PO), Take 1,600 mg by mouth daily at 6 (six) AM., Disp: , Rfl:  .  traZODone (DESYREL) 100 MG tablet, TAKE 1 TABLET BY MOUTH EVERYDAY AT BEDTIME, Disp: 90 tablet, Rfl: 2 .  vitamin E 400 UNIT capsule, Take 400 Units by mouth daily., Disp: ,  Rfl:    No Known Allergies   Review of Systems  Constitutional: Negative.   Eyes: Negative for blurred vision.  Respiratory: Negative.  Negative for shortness of breath.   Cardiovascular: Negative.  Negative for chest pain and palpitations.  Gastrointestinal: Negative.   Musculoskeletal: Positive for back pain.       She c/o right-sided back pain. It radiates down her right lower leg.  Denies fall/trauma.  Denies LE weakness.  There is pain with movement.   Neurological: Negative.   Psychiatric/Behavioral: Negative.      Today's Vitals   12/20/19 1121  BP: 124/68  Pulse: 67  Temp: 98.5 F (36.9 C)  TempSrc: Oral  Weight:  155 lb 3.2 oz (70.4 kg)  Height: 5' 4.6" (1.641 m)  PainSc: 0-No pain   Body mass index is 26.15 kg/m.   Objective:  Physical Exam Vitals and nursing note reviewed.  Constitutional:      Appearance: Normal appearance.  HENT:     Head: Normocephalic and atraumatic.  Cardiovascular:     Rate and Rhythm: Normal rate and regular rhythm.     Heart sounds: Normal heart sounds.  Pulmonary:     Effort: Pulmonary effort is normal.     Breath sounds: Normal breath sounds.  Musculoskeletal:     Comments: Lumbar paravertebral mm tender to palpation. Neg straight leg test.   Skin:    General: Skin is warm.  Neurological:     General: No focal deficit present.     Mental Status: She is alert.  Psychiatric:        Mood and Affect: Mood normal.        Behavior: Behavior normal.         Assessment And Plan:     1. Essential hypertension, benign  Chronic, well controlled. She will continue with current meds.  I will check renal function. She will rto in six months for re-evaluation.   - BASIC METABOLIC PANEL WITH GFR  2. Primary hypothyroidism  I will check thyroid panel and adjust meds as needed.  - TSH - T4, free  3. Lumbar radiculopathy  She is scheduled to see Dr. Mina Marble on 7/26. Reports referral not needed. She was given ketorolac 30mg  IM x 1.  She was given rx methocarbamol to take prn along with Vicodin prn. Review of the [place] CSRS was performed in accordance of the [place] prior to dispensing any controlled drugs.  - ketorolac (TORADOL) 30 MG/ML injection 30 mg     Maximino Greenland, MD   I, Maximino Greenland, MD, have reviewed all documentation for this visit. The documentation on 12/31/19 for the exam, diagnosis, procedures, and orders are all accurate and complete.  THE PATIENT IS ENCOURAGED TO PRACTICE SOCIAL DISTANCING DUE TO THE COVID-19 PANDEMIC.

## 2020-01-15 DIAGNOSIS — M48061 Spinal stenosis, lumbar region without neurogenic claudication: Secondary | ICD-10-CM | POA: Diagnosis not present

## 2020-01-16 ENCOUNTER — Other Ambulatory Visit: Payer: Medicare Other

## 2020-01-16 ENCOUNTER — Other Ambulatory Visit: Payer: Self-pay

## 2020-01-16 DIAGNOSIS — N289 Disorder of kidney and ureter, unspecified: Secondary | ICD-10-CM

## 2020-01-17 LAB — BASIC METABOLIC PANEL WITH GFR
BUN/Creatinine Ratio: 9 (calc) (ref 6–22)
BUN: 9 mg/dL (ref 7–25)
CO2: 35 mmol/L — ABNORMAL HIGH (ref 20–32)
Calcium: 10 mg/dL (ref 8.6–10.4)
Chloride: 99 mmol/L (ref 98–110)
Creat: 0.96 mg/dL — ABNORMAL HIGH (ref 0.60–0.93)
GFR, Est African American: 69 mL/min/{1.73_m2} (ref >=60)
GFR, Est Non African American: 60 mL/min/{1.73_m2} (ref >=60)
Glucose, Bld: 100 mg/dL — ABNORMAL HIGH (ref 65–99)
Potassium: 3.5 mmol/L (ref 3.5–5.3)
Sodium: 140 mmol/L (ref 135–146)

## 2020-01-18 ENCOUNTER — Telehealth: Payer: Self-pay

## 2020-01-18 ENCOUNTER — Other Ambulatory Visit: Payer: Self-pay | Admitting: Hematology & Oncology

## 2020-01-18 ENCOUNTER — Other Ambulatory Visit: Payer: Self-pay | Admitting: Nurse Practitioner

## 2020-01-18 DIAGNOSIS — D573 Sickle-cell trait: Secondary | ICD-10-CM

## 2020-01-18 DIAGNOSIS — D473 Essential (hemorrhagic) thrombocythemia: Secondary | ICD-10-CM

## 2020-01-18 DIAGNOSIS — L03818 Cellulitis of other sites: Secondary | ICD-10-CM

## 2020-01-18 NOTE — Telephone Encounter (Signed)
Patient notified

## 2020-01-18 NOTE — Telephone Encounter (Signed)
-----   Message from Glendale Chard, MD sent at 01/17/2020  8:41 PM EDT ----- Your kidney function has improved. Be sure to stay well hydrated.

## 2020-02-14 DIAGNOSIS — M48061 Spinal stenosis, lumbar region without neurogenic claudication: Secondary | ICD-10-CM | POA: Diagnosis not present

## 2020-03-13 ENCOUNTER — Encounter: Payer: Self-pay | Admitting: Internal Medicine

## 2020-03-20 ENCOUNTER — Encounter: Payer: Self-pay | Admitting: Internal Medicine

## 2020-04-02 ENCOUNTER — Encounter: Payer: Self-pay | Admitting: Internal Medicine

## 2020-05-23 ENCOUNTER — Other Ambulatory Visit: Payer: Self-pay | Admitting: Internal Medicine

## 2020-05-27 ENCOUNTER — Other Ambulatory Visit: Payer: Self-pay | Admitting: Hematology & Oncology

## 2020-05-27 DIAGNOSIS — D573 Sickle-cell trait: Secondary | ICD-10-CM

## 2020-05-27 DIAGNOSIS — D473 Essential (hemorrhagic) thrombocythemia: Secondary | ICD-10-CM

## 2020-05-27 DIAGNOSIS — R609 Edema, unspecified: Secondary | ICD-10-CM | POA: Diagnosis not present

## 2020-05-27 DIAGNOSIS — E039 Hypothyroidism, unspecified: Secondary | ICD-10-CM | POA: Diagnosis not present

## 2020-05-27 DIAGNOSIS — Z9181 History of falling: Secondary | ICD-10-CM | POA: Diagnosis not present

## 2020-05-27 DIAGNOSIS — G4700 Insomnia, unspecified: Secondary | ICD-10-CM | POA: Diagnosis not present

## 2020-05-27 DIAGNOSIS — Z87891 Personal history of nicotine dependence: Secondary | ICD-10-CM | POA: Diagnosis not present

## 2020-05-27 DIAGNOSIS — I10 Essential (primary) hypertension: Secondary | ICD-10-CM | POA: Diagnosis not present

## 2020-06-08 ENCOUNTER — Other Ambulatory Visit: Payer: Self-pay | Admitting: Internal Medicine

## 2020-06-29 ENCOUNTER — Other Ambulatory Visit: Payer: Self-pay | Admitting: Nurse Practitioner

## 2020-06-29 DIAGNOSIS — G4700 Insomnia, unspecified: Secondary | ICD-10-CM

## 2020-07-02 ENCOUNTER — Encounter: Payer: Self-pay | Admitting: Internal Medicine

## 2020-07-02 ENCOUNTER — Other Ambulatory Visit: Payer: Self-pay

## 2020-07-02 ENCOUNTER — Ambulatory Visit (INDEPENDENT_AMBULATORY_CARE_PROVIDER_SITE_OTHER): Payer: Medicare HMO | Admitting: Internal Medicine

## 2020-07-02 VITALS — BP 126/70 | HR 71 | Temp 97.5°F | Ht 64.6 in | Wt 149.8 lb

## 2020-07-02 DIAGNOSIS — Z6825 Body mass index (BMI) 25.0-25.9, adult: Secondary | ICD-10-CM

## 2020-07-02 DIAGNOSIS — E039 Hypothyroidism, unspecified: Secondary | ICD-10-CM

## 2020-07-02 DIAGNOSIS — D573 Sickle-cell trait: Secondary | ICD-10-CM | POA: Diagnosis not present

## 2020-07-02 DIAGNOSIS — D473 Essential (hemorrhagic) thrombocythemia: Secondary | ICD-10-CM

## 2020-07-02 DIAGNOSIS — I10 Essential (primary) hypertension: Secondary | ICD-10-CM

## 2020-07-02 MED ORDER — FOLIC ACID 1 MG PO TABS: 1.0000 mg | ORAL_TABLET | Freq: Every day | ORAL | 1 refills | Status: DC

## 2020-07-02 MED ORDER — FLUTICASONE PROPIONATE 50 MCG/ACT NA SUSP: 1.0000 | Freq: Every day | NASAL | 1 refills | Status: DC

## 2020-07-02 NOTE — Progress Notes (Signed)
I,Katawbba Wiggins,acting as a Education administrator for Maximino Greenland, MD.,have documented all relevant documentation on the behalf of Maximino Greenland, MD,as directed by  Maximino Greenland, MD while in the presence of Maximino Greenland, MD.  This visit occurred during the SARS-CoV-2 public health emergency.  Safety protocols were in place, including screening questions prior to the visit, additional usage of staff PPE, and extensive cleaning of exam room while observing appropriate contact time as indicated for disinfecting solutions.  Subjective:     Patient ID: Maria Hall , female    DOB: 1951-06-12 , 70 y.o.   MRN: [id number]   Chief Complaint  Patient presents with  . Hypertension    HPI  She presents today for blood pressure and thyroid check. She reports compliance with meds. She denies headaches, chest pain and shortness of breath.   Hypertension This is a chronic problem. The current episode started more than 1 year ago. The problem has been gradually improving since onset. The problem is controlled. Pertinent negatives include no blurred vision, palpitations or peripheral edema. Past treatments include diuretics. The current treatment provides moderate improvement. There are no compliance problems.  Identifiable causes of hypertension include a thyroid problem.  Thyroid Problem Presents for follow-up visit. Patient reports no depressed mood, diaphoresis, menstrual problem, palpitations or tremors. The symptoms have been stable.     Past Medical History:  Diagnosis Date  . 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 Bronchitis    gets every year in the winter  . Insomnia    takes meds  . Right rotator cuff tear   . Swelling    in hands and ankles  . Thyroid disease      Family History  Adopted: Yes     Current Outpatient Medications:  .  acetaminophen (TYLENOL) 650 MG CR tablet, Take 650 mg by mouth. Take 2 caplets daily, Disp: , Rfl:  .  albuterol (VENTOLIN HFA) 108 (90 Base)  MCG/ACT inhaler, INHALE 2 PUFFS BY MOUTH EVERY 4 TO 6 HOURS AS NEEDED, Disp: 6.7 g, Rfl: 3 .  Ascorbic Acid (VITAMIN C) 1000 MG tablet, Take 1,000 mg by mouth daily., Disp: , Rfl:  .  cholecalciferol (VITAMIN D) 1000 units tablet, Take 1,000 Units by mouth daily., Disp: , Rfl:  .  Docusate Calcium (STOOL SOFTENER PO), Take 100 mg by mouth. Take 2 soft gels daily, Disp: , Rfl:  .  hydrochlorothiazide (HYDRODIURIL) 12.5 MG tablet, TAKE 1 TABLET BY MOUTH EVERY DAY, Disp: 90 tablet, Rfl: 1 .  HYDROcodone-acetaminophen (NORCO/VICODIN) 5-325 MG tablet, Take 1 tablet by mouth every 6 (six) hours as needed for moderate pain., Disp: 30 tablet, Rfl: 0 .  levothyroxine (SYNTHROID) 88 MCG tablet, TAKE 1 TABLET (88 MCG TOTAL) BY MOUTH DAILY BEFORE BREAKFAST. MON - SAT ONLY, Disp: 78 tablet, Rfl: 1 .  Omega 3-6-9 Fatty Acids (OMEGA 3-6-9 COMPLEX PO), Take 1,600 mg by mouth daily at 6 (six) AM., Disp: , Rfl:  .  traZODone (DESYREL) 100 MG tablet, TAKE 1 TABLET BY MOUTH EVERYDAY AT BEDTIME, Disp: 90 tablet, Rfl: 2 .  vitamin E 400 UNIT capsule, Take 400 Units by mouth daily., Disp: , Rfl:  .  aspirin EC 81 MG tablet, Take 81 mg by mouth daily., Disp: , Rfl:  .  cyclobenzaprine (FLEXERIL) 5 MG tablet, TAKE 1 TABLET (5 MG TOTAL) BY MOUTH AT BEDTIME AS NEEDED FOR MUSCLE SPASMS. (Patient not taking: Reported on 07/02/2020), Disp: 30 tablet, Rfl:  0 .  fluticasone (FLONASE) 50 MCG/ACT nasal spray, Place 1 spray into both nostrils daily., Disp: 48 mL, Rfl: 1 .  folic acid (FOLVITE) 1 MG tablet, Take 1 tablet (1 mg total) by mouth daily., Disp: 90 tablet, Rfl: 1 .  methocarbamol (ROBAXIN) 500 MG tablet, Take 1 tablet (500 mg total) by mouth every 8 (eight) hours as needed for muscle spasms. (Patient not taking: Reported on 07/02/2020), Disp: 40 tablet, Rfl: 0   No Known Allergies   Review of Systems  Constitutional: Negative.  Negative for diaphoresis.  Eyes: Negative for blurred vision.  Respiratory: Negative.    Cardiovascular: Negative.  Negative for palpitations.  Gastrointestinal: Negative.   Genitourinary: Negative for menstrual problem.  Neurological: Negative.  Negative for tremors.  Psychiatric/Behavioral: Negative.      Today's Vitals   07/02/20 1109  BP: 126/70  Pulse: 71  Temp: (!) 97.5 F (36.4 C)  TempSrc: Oral  Weight: 149 lb 12.8 oz (67.9 kg)  Height: 5' 4.6" (1.641 m)  PainSc: 0-No pain   Body mass index is 25.24 kg/m.  Wt Readings from Last 3 Encounters:  07/02/20 149 lb 12.8 oz (67.9 kg)  12/20/19 155 lb 3.2 oz (70.4 kg)  09/07/19 163 lb 12.8 oz (74.3 kg)   Objective:  Physical Exam Vitals and nursing note reviewed.  Constitutional:      Appearance: Normal appearance.  HENT:     Head: Normocephalic and atraumatic.  Cardiovascular:     Rate and Rhythm: Normal rate and regular rhythm.     Heart sounds: Normal heart sounds.  Pulmonary:     Effort: Pulmonary effort is normal.     Breath sounds: Normal breath sounds.  Skin:    General: Skin is warm.  Neurological:     General: No focal deficit present.     Mental Status: She is alert.  Psychiatric:        Mood and Affect: Mood normal.        Behavior: Behavior normal.         Assessment And Plan:     1. Essential hypertension, benign Comments: Chronic, well controlled. She will c/w HCTZ for now. Advised to include potassium rich foods in her diet. I will check renal function today.  - CMP14+EGFR  2. Primary hypothyroidism Comments: I will check thyroid panel and adjust meds as needed.  - TSH - CMP14+EGFR - T4, free  3. BMI 25.0-25.9,adult Comments: Her weight is acceptable for her demographic. Advised to aim for at least 150 minutes of exercise per week.   4. Sickle-cell trait ([place]) Comments: Chronic, she is stable. Encouraged to stay well hydrated.    Patient was given opportunity to ask questions. Patient verbalized understanding of the plan and was able to repeat key elements of the plan.  All questions were answered to their satisfaction.  Maximino Greenland, MD   I, Maximino Greenland, MD, have reviewed all documentation for this visit. The documentation on 07/02/20 for the exam, diagnosis, procedures, and orders are all accurate and complete.  THE PATIENT IS ENCOURAGED TO PRACTICE SOCIAL DISTANCING DUE TO THE COVID-19 PANDEMIC.

## 2020-07-02 NOTE — Patient Instructions (Signed)

## 2020-07-03 LAB — CMP14+EGFR
ALT: 12 IU/L (ref 0–32)
AST: 22 IU/L (ref 0–40)
Albumin/Globulin Ratio: 1.4 (ref 1.2–2.2)
Albumin: 4.1 g/dL (ref 3.7–4.7)
Alkaline Phosphatase: 121 IU/L (ref 44–121)
BUN/Creatinine Ratio: 11 — ABNORMAL LOW (ref 12–28)
BUN: 9 mg/dL (ref 8–27)
Bilirubin Total: 0.5 mg/dL (ref 0.0–1.2)
CO2: 28 mmol/L (ref 20–29)
Calcium: 10.2 mg/dL (ref 8.7–10.3)
Chloride: 99 mmol/L (ref 96–106)
Creatinine, Ser: 0.82 mg/dL (ref 0.57–1.00)
GFR calc Af Amer: 83 mL/min/{1.73_m2} (ref >59)
GFR calc non Af Amer: 72 mL/min/{1.73_m2} (ref >59)
Globulin, Total: 2.9 g/dL (ref 1.5–4.5)
Glucose: 103 mg/dL — ABNORMAL HIGH (ref 65–99)
Potassium: 3.9 mmol/L (ref 3.5–5.2)
Sodium: 141 mmol/L (ref 134–144)
Total Protein: 7 g/dL (ref 6.0–8.5)

## 2020-07-03 LAB — T4, FREE: Free T4: 1.27 ng/dL (ref 0.82–1.77)

## 2020-07-03 LAB — TSH: TSH: 1.44 u[IU]/mL (ref 0.450–4.500)

## 2020-07-04 ENCOUNTER — Other Ambulatory Visit: Payer: Self-pay

## 2020-07-04 ENCOUNTER — Other Ambulatory Visit: Payer: Self-pay | Admitting: Internal Medicine

## 2020-07-04 ENCOUNTER — Ambulatory Visit (INDEPENDENT_AMBULATORY_CARE_PROVIDER_SITE_OTHER): Payer: Medicare HMO

## 2020-07-04 VITALS — BP 105/58 | HR 85 | Temp 97.9°F | Ht 65.0 in | Wt 151.6 lb

## 2020-07-04 DIAGNOSIS — Z0000 Encounter for general adult medical examination without abnormal findings: Secondary | ICD-10-CM | POA: Diagnosis not present

## 2020-07-04 DIAGNOSIS — Z1231 Encounter for screening mammogram for malignant neoplasm of breast: Secondary | ICD-10-CM

## 2020-07-04 NOTE — Progress Notes (Signed)
This visit occurred during the SARS-CoV-2 public health emergency.  Safety protocols were in place, including screening questions prior to the visit, additional usage of staff PPE, and extensive cleaning of exam room while observing appropriate contact time as indicated for disinfecting solutions.  Subjective:   Maria Hall is a 70 y.o. female who presents for Medicare Annual (Subsequent) preventive examination.  Review of Systems     Cardiac Risk Factors include: advanced age (>79men, >23 women);hypertension     Objective:    Today's Vitals   07/04/20 0838  BP: (!) 105/58  Pulse: 85  Temp: 97.9 F (36.6 C)  TempSrc: Oral  SpO2: 91%  Weight: 151 lb 9.6 oz (68.8 kg)  Height: 5\' 5"  (1.651 m)   Body mass index is 25.23 kg/m.  Advanced Directives 07/04/2020 06/21/2019 06/09/2018 11/24/2016 11/24/2014 04/11/2014  Does Patient Have a Medical Advance Directive? Yes Yes Yes Yes No No  Type of Paramedic of [place];Living will [place];Living will Living will;Healthcare Power of [place] - -  Does patient want to make changes to medical advance directive? - - No - Patient declined - - -  Copy of [place] in Chart? No - copy requested No - copy requested No - copy requested - - -  Would patient like information on creating a medical advance directive? - - - - - No - patient declined information    Current Medications (verified) Outpatient Encounter Medications as of 07/04/2020  Medication Sig  . acetaminophen (TYLENOL) 650 MG CR tablet Take 650 mg by mouth. Take 2 caplets daily  . albuterol (VENTOLIN HFA) 108 (90 Base) MCG/ACT inhaler INHALE 2 PUFFS BY MOUTH EVERY 4 TO 6 HOURS AS NEEDED  . Ascorbic Acid (VITAMIN C) 1000 MG tablet Take 1,000 mg by mouth daily.  Marland Kitchen aspirin EC 81 MG tablet Take 81 mg by mouth daily.  . cholecalciferol (VITAMIN D) 1000 units tablet Take 1,000 Units by mouth  daily.  . cyclobenzaprine (FLEXERIL) 5 MG tablet TAKE 1 TABLET (5 MG TOTAL) BY MOUTH AT BEDTIME AS NEEDED FOR MUSCLE SPASMS. (Patient not taking: Reported on 07/02/2020)  . Docusate Calcium (STOOL SOFTENER PO) Take 100 mg by mouth. Take 2 soft gels daily  . fluticasone (FLONASE) 50 MCG/ACT nasal spray Place 1 spray into both nostrils daily.  . folic acid (FOLVITE) 1 MG tablet Take 1 tablet (1 mg total) by mouth daily.  . hydrochlorothiazide (HYDRODIURIL) 12.5 MG tablet TAKE 1 TABLET BY MOUTH EVERY DAY  . HYDROcodone-acetaminophen (NORCO/VICODIN) 5-325 MG tablet Take 1 tablet by mouth every 6 (six) hours as needed for moderate pain.  Marland Kitchen levothyroxine (SYNTHROID) 88 MCG tablet TAKE 1 TABLET (88 MCG TOTAL) BY MOUTH DAILY BEFORE BREAKFAST. MON - SAT ONLY  . methocarbamol (ROBAXIN) 500 MG tablet Take 1 tablet (500 mg total) by mouth every 8 (eight) hours as needed for muscle spasms. (Patient not taking: Reported on 07/02/2020)  . Omega 3-6-9 Fatty Acids (OMEGA 3-6-9 COMPLEX PO) Take 1,600 mg by mouth daily at 6 (six) AM.  . traZODone (DESYREL) 100 MG tablet TAKE 1 TABLET BY MOUTH EVERYDAY AT BEDTIME  . vitamin E 400 UNIT capsule Take 400 Units by mouth daily.   No facility-administered encounter medications on file as of 07/04/2020.    Allergies (verified) Patient has no known allergies.   History: Past Medical History:  Diagnosis Date  . 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  Bronchitis    gets every year in the winter  . Insomnia    takes meds  . Right rotator cuff tear   . Swelling    in hands and ankles  . Thyroid disease    Past Surgical History:  Procedure Laterality Date  . ABDOMINAL HYSTERECTOMY    . BUNIONECTOMY     bil feet  . CESAREAN SECTION    . CESAREAN SECTION     1 time  . CHOLECYSTECTOMY    . COLONOSCOPY    . tummy tuck     Family History  Adopted: Yes   Social History   Socioeconomic History  . Marital status: Married    Spouse name: Not on file  . Number  of children: Not on file  . Years of education: Not on file  . Highest education level: Not on file  Occupational History  . Occupation: retired  Tobacco Use  . Smoking status: Former Smoker    Packs/day: 1.00    Years: 20.00    Pack years: 20.00    Types: Cigarettes    Quit date: 1990    Years since quitting: 32.0  . Smokeless tobacco: Never Used  Vaping Use  . Vaping Use: Never used  Substance and Sexual Activity  . Alcohol use: Not Currently  . Drug use: No  . Sexual activity: Yes  Other Topics Concern  . Not on file  Social History Narrative  . Not on file   Social Determinants of Health   Financial Resource Strain: Low Risk   . Difficulty of Paying Living Expenses: Not hard at all  Food Insecurity: No Food Insecurity  . Worried About Charity fundraiser in the Last Year: Never true  . Ran Out of Food in the Last Year: Never true  Transportation Needs: No Transportation Needs  . Lack of Transportation (Medical): No  . Lack of Transportation (Non-Medical): No  Physical Activity: Sufficiently Active  . Days of Exercise per Week: 3 days  . Minutes of Exercise per Session: 90 min  Stress: No Stress Concern Present  . Feeling of Stress : Not at all  Social Connections: Not on file    Tobacco Counseling Counseling given: Not Answered   Clinical Intake:  Pre-visit preparation completed: Yes  Pain : No/denies pain     Nutritional Status: BMI 25 -29 Overweight Nutritional Risks: None Diabetes: No  How often do you need to have someone help you when you read instructions, pamphlets, or other written materials from your doctor or pharmacy?: 1 - Never What is the last grade level you completed in school?: ba degree  Diabetic? no  Interpreter Needed?: No  Information entered by :: NAllen LPN   Activities of Daily Living In your present state of health, do you have any difficulty performing the following activities: 07/04/2020 07/02/2020  Hearing? Y N   Comment sometimes can not understand what is being said -  Vision? N N  Difficulty concentrating or making decisions? Y N  Walking or climbing stairs? N N  Dressing or bathing? N N  Doing errands, shopping? N N  Preparing Food and eating ? N -  Using the Toilet? N -  In the past six months, have you accidently leaked urine? N -  Do you have problems with loss of bowel control? N -  Managing your Medications? N -  Managing your Finances? N -  Housekeeping or managing your Housekeeping? N -  Some recent data might  be hidden    Patient Care Team: Glendale Chard, MD as PCP - General (Internal Medicine) Warden Fillers, MD as Consulting Physician (Ophthalmology)  Indicate any recent Medical Services you may have received from other than [facility] providers in the past year (date may be approximate).     Assessment:   This is a routine wellness examination for Brena.  Hearing/Vision screen No exam data present  Dietary issues and exercise activities discussed: Current Exercise Habits: Home exercise routine, Type of exercise: strength training/weights, Time (Minutes): > 60, Frequency (Times/Week): 3, Weekly Exercise (Minutes/Week): 0  Goals    .  DIET - INCREASE WATER INTAKE (pt-stated)    .  DIET - INCREASE WATER INTAKE      06/21/2019, wants to still work on water intake. Also would like to work on exercising (strength training)    .  Patient Stated      07/04/2020, eat healthier      Depression Screen PHQ 2/9 Scores 07/04/2020 09/07/2019 06/21/2019 05/01/2019 03/28/2019 12/12/2018 06/09/2018  PHQ - 2 Score 0 0 0 0 0 0 0  PHQ- 9 Score - - 0 - - - 0    Fall Risk Fall Risk  07/04/2020 09/07/2019 06/21/2019 05/01/2019 03/28/2019  Falls in the past year? 1 0 0 0 0  Comment tripped, fell out of bed - - - -  Number falls in past yr: 1 - - - -  Injury with Fall? 0 - - - -  Risk for fall due to : Medication side effect - Medication side effect - -  Follow up Falls evaluation  completed;Education provided;Falls prevention discussed - Falls evaluation completed;Education provided;Falls prevention discussed - -    FALL RISK PREVENTION PERTAINING TO THE HOME:  Any stairs in or around the home? Yes  If so, are there any without handrails? Yes  Home free of loose throw rugs in walkways, pet beds, electrical cords, etc? Yes  Adequate lighting in your home to reduce risk of falls? Yes   ASSISTIVE DEVICES UTILIZED TO PREVENT FALLS:  Life alert? No  Use of a cane, walker or w/c? No  Grab bars in the bathroom? No  Shower chair or bench in shower? No  Elevated toilet seat or a handicapped toilet? Yes   TIMED UP AND GO:  Was the test performed? No .   Gait steady and fast without use of assistive device  Cognitive Function:     6CIT Screen 07/04/2020 06/21/2019 06/09/2018  What Year? 0 points 0 points 0 points  What month? 0 points 0 points 0 points  What time? 0 points 0 points 0 points  Count back from 20 0 points 0 points 0 points  Months in reverse 0 points 0 points 0 points  Repeat phrase 0 points 0 points 0 points  Total Score 0 0 0    Immunizations Immunization History  Administered Date(s) Administered  . Influenza, High Dose Seasonal PF 04/08/2018, 03/01/2019  . Influenza-Unspecified 04/11/2018, 03/03/2019, 04/02/2020  . PFIZER SARS-COV-2 Vaccination 07/28/2019, 08/18/2019, 03/18/2020  . Pneumococcal Conjugate-13 03/10/2016  . Pneumococcal Polysaccharide-23 07/12/2014  . Tdap 01/22/2014  . Zoster 03/09/2016  . Zoster Recombinat (Shingrix) 04/04/2017, 07/27/2017    TDAP status: Up to date  Flu Vaccine status: Up to date  Pneumococcal vaccine status: Up to date  Covid-19 vaccine status: Completed vaccines  Qualifies for Shingles Vaccine? Yes   Zostavax completed Yes   Shingrix Completed?: Yes  Screening Tests Health Maintenance  Topic Date Due  .  COVID-19 Vaccine (4 - Booster for Pfizer series) 09/15/2020  . MAMMOGRAM  05/16/2021   . COLONOSCOPY (Pts 45-15yrs Insurance coverage will need to be confirmed)  08/10/2022  . TETANUS/TDAP  01/23/2024  . INFLUENZA VACCINE  Completed  . DEXA SCAN  Completed  . Hepatitis C Screening  Completed  . PNA vac Low Risk Adult  Completed    Health Maintenance  There are no preventive care reminders to display for this patient.  Colorectal cancer screening: Type of screening: Colonoscopy. Completed 08/10/2017. Repeat every 5 years  Mammogram status: patient to schedule  Bone Density status: Completed 05/26/2016.   Lung Cancer Screening: (Low Dose CT Chest recommended if Age 101-80 years, 30 pack-year currently smoking OR have quit w/in 15years.) does not qualify.   Lung Cancer Screening Referral: no  Additional Screening:  Hepatitis C Screening: does qualify; Completed 06/09/2018   Vision Screening: Recommended annual ophthalmology exams for early detection of glaucoma and other disorders of the eye. Is the patient up to date with their annual eye exam?  Yes  Who is the provider or what is the name of the office in which the patient attends annual eye exams? Dr. Katy Fitch If pt is not established with a provider, would they like to be referred to a provider to establish care? No .   Dental Screening: Recommended annual dental exams for proper oral hygiene  Community Resource Referral / Chronic Care Management: CRR required this visit?  No   CCM required this visit?  No      Plan:     I have personally reviewed and noted the following in the patient's chart:   . Medical and social history . Use of alcohol, tobacco or illicit drugs  . Current medications and supplements . Functional ability and status . Nutritional status . Physical activity . Advanced directives . List of other physicians . Hospitalizations, surgeries, and ER visits in previous 12 months . Vitals . Screenings to include cognitive, depression, and falls . Referrals and appointments  In  addition, I have reviewed and discussed with patient certain preventive protocols, quality metrics, and best practice recommendations. A written personalized care plan for preventive services as well as general preventive health recommendations were provided to patient.     Kellie Simmering, LPN   [id number]   Nurse Notes:

## 2020-07-04 NOTE — Patient Instructions (Signed)
Maria Hall , Thank you for taking time to come for your Medicare Wellness Visit. I appreciate your ongoing commitment to your health goals. Please review the following plan we discussed and let me know if I can assist you in the future.   Screening recommendations/referrals: Colonoscopy: completed 08/10/2017, due 08/10/2022 Mammogram: patient to schedule Bone Density: completed 05/26/2016 Recommended yearly ophthalmology/optometry visit for glaucoma screening and checkup Recommended yearly dental visit for hygiene and checkup  Vaccinations: Influenza vaccine: completed 04/02/2020, due 01/20/2021 Pneumococcal vaccine: completed 03/10/2016 Tdap vaccine: completed 01/22/2014, due 01/23/2024 Shingles vaccine: completed   Covid-19: 03/18/2020, 08/18/2019, 07/28/2019  Advanced directives: Please bring a copy of your POA (Power of Attorney) and/or Living Will to your next appointment.   Conditions/risks identified: none  Next appointment: Follow up in one year for your annual wellness visit    Preventive Care 65 Years and Older, Female Preventive care refers to lifestyle choices and visits with your health care provider that can promote health and wellness. What does preventive care include?  A yearly physical exam. This is also called an annual well check.  Dental exams once or twice a year.  Routine eye exams. Ask your health care provider how often you should have your eyes checked.  Personal lifestyle choices, including:  Daily care of your teeth and gums.  Regular physical activity.  Eating a healthy diet.  Avoiding tobacco and drug use.  Limiting alcohol use.  Practicing safe sex.  Taking low-dose aspirin every day.  Taking vitamin and mineral supplements as recommended by your health care provider. What happens during an annual well check? The services and screenings done by your health care provider during your annual well check will depend on your age, overall health,  lifestyle risk factors, and family history of disease. Counseling  Your health care provider may ask you questions about your:  Alcohol use.  Tobacco use.  Drug use.  Emotional well-being.  Home and relationship well-being.  Sexual activity.  Eating habits.  History of falls.  Memory and ability to understand (cognition).  Work and work Statistician.  Reproductive health. Screening  You may have the following tests or measurements:  Height, weight, and BMI.  Blood pressure.  Lipid and cholesterol levels. These may be checked every 5 years, or more frequently if you are over 72 years old.  Skin check.  Lung cancer screening. You may have this screening every year starting at age 72 if you have a 30-pack-year history of smoking and currently smoke or have quit within the past 15 years.  Fecal occult blood test (FOBT) of the stool. You may have this test every year starting at age 72.  Flexible sigmoidoscopy or colonoscopy. You may have a sigmoidoscopy every 5 years or a colonoscopy every 10 years starting at age 72.  Hepatitis C blood test.  Hepatitis B blood test.  Sexually transmitted disease (STD) testing.  Diabetes screening. This is done by checking your blood sugar (glucose) after you have not eaten for a while (fasting). You may have this done every 1-3 years.  Bone density scan. This is done to screen for osteoporosis. You may have this done starting at age 72.  Mammogram. This may be done every 1-2 years. Talk to your health care provider about how often you should have regular mammograms. Talk with your health care provider about your test results, treatment options, and if necessary, the need for more tests. Vaccines  Your health care provider may recommend certain vaccines, such  as:  Influenza vaccine. This is recommended every year.  Tetanus, diphtheria, and acellular pertussis (Tdap, Td) vaccine. You may need a Td booster every 10 years.  Zoster  vaccine. You may need this after age 72.  Pneumococcal 13-valent conjugate (PCV13) vaccine. One dose is recommended after age 72.  Pneumococcal polysaccharide (PPSV23) vaccine. One dose is recommended after age 72. Talk to your health care provider about which screenings and vaccines you need and how often you need them. This information is not intended to replace advice given to you by your health care provider. Make sure you discuss any questions you have with your health care provider. Document Released: 07/05/2015 Document Revised: 02/26/2016 Document Reviewed: 04/09/2015 Elsevier Interactive Patient Education  2017 [place] Prevention in the Home Falls can cause injuries. They can happen to people of all ages. There are many things you can do to make your home safe and to help prevent falls. What can I do on the outside of my home?  Regularly fix the edges of walkways and driveways and fix any cracks.  Remove anything that might make you trip as you walk through a door, such as a raised step or threshold.  Trim any bushes or trees on the path to your home.  Use bright outdoor lighting.  Clear any walking paths of anything that might make someone trip, such as rocks or tools.  Regularly check to see if handrails are loose or broken. Make sure that both sides of any steps have handrails.  Any raised decks and porches should have guardrails on the edges.  Have any leaves, snow, or ice cleared regularly.  Use sand or salt on walking paths during winter.  Clean up any spills in your garage right away. This includes oil or grease spills. What can I do in the bathroom?  Use night lights.  Install grab bars by the toilet and in the tub and shower. Do not use towel bars as grab bars.  Use non-skid mats or decals in the tub or shower.  If you need to sit down in the shower, use a plastic, non-slip stool.  Keep the floor dry. Clean up any water that spills on the  floor as soon as it happens.  Remove soap buildup in the tub or shower regularly.  Attach bath mats securely with double-sided non-slip rug tape.  Do not have throw rugs and other things on the floor that can make you trip. What can I do in the bedroom?  Use night lights.  Make sure that you have a light by your bed that is easy to reach.  Do not use any sheets or blankets that are too big for your bed. They should not hang down onto the floor.  Have a firm chair that has side arms. You can use this for support while you get dressed.  Do not have throw rugs and other things on the floor that can make you trip. What can I do in the kitchen?  Clean up any spills right away.  Avoid walking on wet floors.  Keep items that you use a lot in easy-to-reach places.  If you need to reach something above you, use a strong step stool that has a grab bar.  Keep electrical cords out of the way.  Do not use floor polish or wax that makes floors slippery. If you must use wax, use non-skid floor wax.  Do not have throw rugs and other things on the  floor that can make you trip. What can I do with my stairs?  Do not leave any items on the stairs.  Make sure that there are handrails on both sides of the stairs and use them. Fix handrails that are broken or loose. Make sure that handrails are as long as the stairways.  Check any carpeting to make sure that it is firmly attached to the stairs. Fix any carpet that is loose or worn.  Avoid having throw rugs at the top or bottom of the stairs. If you do have throw rugs, attach them to the floor with carpet tape.  Make sure that you have a light switch at the top of the stairs and the bottom of the stairs. If you do not have them, ask someone to add them for you. What else can I do to help prevent falls?  Wear shoes that:  Do not have high heels.  Have rubber bottoms.  Are comfortable and fit you well.  Are closed at the toe. Do not wear  sandals.  If you use a stepladder:  Make sure that it is fully opened. Do not climb a closed stepladder.  Make sure that both sides of the stepladder are locked into place.  Ask someone to hold it for you, if possible.  Clearly mark and make sure that you can see:  Any grab bars or handrails.  First and last steps.  Where the edge of each step is.  Use tools that help you move around (mobility aids) if they are needed. These include:  Canes.  Walkers.  Scooters.  Crutches.  Turn on the lights when you go into a dark area. Replace any light bulbs as soon as they burn out.  Set up your furniture so you have a clear path. Avoid moving your furniture around.  If any of your floors are uneven, fix them.  If there are any pets around you, be aware of where they are.  Review your medicines with your doctor. Some medicines can make you feel dizzy. This can increase your chance of falling. Ask your doctor what other things that you can do to help prevent falls. This information is not intended to replace advice given to you by your health care provider. Make sure you discuss any questions you have with your health care provider. Document Released: 04/04/2009 Document Revised: 11/14/2015 Document Reviewed: 07/13/2014 Elsevier Interactive Patient Education  2017 [employer].

## 2020-07-16 DIAGNOSIS — H5213 Myopia, bilateral: Secondary | ICD-10-CM | POA: Diagnosis not present

## 2020-08-16 ENCOUNTER — Ambulatory Visit: Payer: Medicare HMO

## 2020-09-09 ENCOUNTER — Ambulatory Visit (INDEPENDENT_AMBULATORY_CARE_PROVIDER_SITE_OTHER): Payer: Medicare HMO | Admitting: Nurse Practitioner

## 2020-09-09 ENCOUNTER — Other Ambulatory Visit: Payer: Self-pay

## 2020-09-09 ENCOUNTER — Other Ambulatory Visit: Payer: Self-pay | Admitting: Nurse Practitioner

## 2020-09-09 ENCOUNTER — Encounter: Payer: Self-pay | Admitting: Nurse Practitioner

## 2020-09-09 VITALS — BP 118/78 | HR 59 | Temp 97.9°F | Ht 64.0 in | Wt 154.0 lb

## 2020-09-09 DIAGNOSIS — Z0000 Encounter for general adult medical examination without abnormal findings: Secondary | ICD-10-CM

## 2020-09-09 DIAGNOSIS — E039 Hypothyroidism, unspecified: Secondary | ICD-10-CM | POA: Diagnosis not present

## 2020-09-09 DIAGNOSIS — I10 Essential (primary) hypertension: Secondary | ICD-10-CM | POA: Diagnosis not present

## 2020-09-09 NOTE — Progress Notes (Signed)
I,Yamilka Roman [employer] as a Education administrator for [employer], FNP.,have documented all relevant documentation on the behalf of Minette Brine, FNP,as directed by  Minette Brine, FNP while in the presence of Minette Brine, [place]. This visit occurred during the SARS-CoV-2 public health emergency.  Safety protocols were in place, including screening questions prior to the visit, additional usage of staff PPE, and extensive cleaning of exam room while observing appropriate contact time as indicated for disinfecting solutions.  Subjective:     Patient ID: Maria Hall , female    DOB: May 27, 1951 , 70 y.o.   MRN: [id number]   Chief Complaint  Patient presents with  . Health Maintenance    HPI  Patient here for hm  Wt Readings from Last 3 Encounters: 09/09/20 : 154 lb (69.9 kg) 07/04/20 : 151 lb 9.6 oz (68.8 kg) 07/02/20 : 149 lb 12.8 oz (67.9 kg)     Past Medical History:  Diagnosis Date  . 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 Bronchitis    gets every year in the winter  . Insomnia    takes meds  . Right rotator cuff tear   . Swelling    in hands and ankles  . Thyroid disease      Family History  Adopted: Yes     Current Outpatient Medications:  .  acetaminophen (TYLENOL) 650 MG CR tablet, Take 650 mg by mouth. Take 2 caplets daily, Disp: , Rfl:  .  albuterol (VENTOLIN HFA) 108 (90 Base) MCG/ACT inhaler, INHALE 2 PUFFS BY MOUTH EVERY 4 TO 6 HOURS AS NEEDED, Disp: 6.7 g, Rfl: 3 .  Ascorbic Acid (VITAMIN C) 1000 MG tablet, Take 1,000 mg by mouth daily., Disp: , Rfl:  .  aspirin EC 81 MG tablet, Take 81 mg by mouth daily., Disp: , Rfl:  .  cholecalciferol (VITAMIN D) 1000 units tablet, Take 1,000 Units by mouth daily., Disp: , Rfl:  .  Docusate Calcium (STOOL SOFTENER PO), Take 100 mg by mouth. Take 2 soft gels daily, Disp: , Rfl:  .  fluticasone (FLONASE) 50 MCG/ACT nasal spray, Place 1 spray into both nostrils daily., Disp: 48 mL, Rfl: 1 .  folic acid (FOLVITE) 1 MG tablet,  Take 1 tablet (1 mg total) by mouth daily., Disp: 90 tablet, Rfl: 1 .  hydrochlorothiazide (HYDRODIURIL) 12.5 MG tablet, TAKE 1 TABLET BY MOUTH EVERY DAY, Disp: 90 tablet, Rfl: 1 .  HYDROcodone-acetaminophen (NORCO/VICODIN) 5-325 MG tablet, Take 1 tablet by mouth every 6 (six) hours as needed for moderate pain., Disp: 30 tablet, Rfl: 0 .  levothyroxine (SYNTHROID) 88 MCG tablet, TAKE 1 TABLET (88 MCG TOTAL) BY MOUTH DAILY BEFORE BREAKFAST. MON - SAT ONLY, Disp: 78 tablet, Rfl: 1 .  Omega 3-6-9 Fatty Acids (OMEGA 3-6-9 COMPLEX PO), Take 1,600 mg by mouth daily at 6 (six) AM., Disp: , Rfl:  .  traZODone (DESYREL) 100 MG tablet, TAKE 1 TABLET BY MOUTH EVERYDAY AT BEDTIME, Disp: 90 tablet, Rfl: 2 .  vitamin E 400 UNIT capsule, Take 400 Units by mouth daily., Disp: , Rfl:    No Known Allergies    The patient states she is status post hysterectomy.  Negative for: breast discharge, breast lump(s), breast pain and breast self exam. Associated symptoms include abnormal vaginal bleeding. Pertinent negatives include abnormal bleeding (hematology), anxiety, decreased libido, depression, difficulty falling sleep, dyspareunia, history of infertility, nocturia, sexual dysfunction, sleep disturbances, urinary incontinence, urinary urgency, vaginal discharge and vaginal itching. Diet regular. The  patient states her exercise level is moderate, 3-4 days a week.   The patient's tobacco use is:  Social History   Tobacco Use  Smoking Status Former Smoker  . Packs/day: 1.00  . Years: 20.00  . Pack years: 20.00  . Types: Cigarettes  . Quit date: 70  . Years since quitting: 32.2  Smokeless Tobacco Never Used   She has been exposed to passive smoke. The patient's alcohol use is:  Social History   Substance and Sexual Activity  Alcohol Use Not Currently    Review of Systems  Constitutional: Negative.   HENT: Negative.   Eyes: Negative.   Respiratory: Negative.   Cardiovascular: Negative.  Negative for  chest pain, palpitations and leg swelling.  Gastrointestinal: Negative.   Endocrine: Negative.   Genitourinary: Negative.   Musculoskeletal: Negative.   Skin: Negative.   Allergic/Immunologic: Negative.   Neurological: Negative.   Hematological: Negative.   Psychiatric/Behavioral: Negative.      Today's Vitals   09/09/20 1113  BP: 118/78  Pulse: (!) 59  Temp: 97.9 F (36.6 C)  TempSrc: Oral  Weight: 154 lb (69.9 kg)  Height: 5\' 4"  (1.626 m)  PainSc: 0-No pain   Body mass index is 26.43 kg/m.   Objective:  Physical Exam Constitutional:      General: She is not in acute distress.    Appearance: Normal appearance. She is well-developed and normal weight.  HENT:     Head: Normocephalic and atraumatic.     Right Ear: Hearing, tympanic membrane, ear canal and external ear normal. There is no impacted cerumen.     Left Ear: Hearing, tympanic membrane, ear canal and external ear normal. There is no impacted cerumen.     Nose:     Comments: Deferred - masked    Mouth/Throat:     Comments: Deferred - masked Eyes:     General: Lids are normal.     Extraocular Movements: Extraocular movements intact.     Conjunctiva/sclera: Conjunctivae normal.     Pupils: Pupils are equal, round, and reactive to light.     Funduscopic exam:    Right eye: No papilledema.        Left eye: No papilledema.  Neck:     Thyroid: No thyroid mass.     Vascular: No carotid bruit.  Cardiovascular:     Rate and Rhythm: Normal rate and regular rhythm.     Pulses: Normal pulses.     Heart sounds: Normal heart sounds. No murmur heard.   Pulmonary:     Effort: Pulmonary effort is normal.     Breath sounds: Normal breath sounds.  Chest:     Chest wall: No mass.  Breasts:     Tanner Score is 5.     Right: Normal. No mass, tenderness, axillary adenopathy or supraclavicular adenopathy.     Left: Normal. No mass, tenderness, axillary adenopathy or supraclavicular adenopathy.    Abdominal:      General: Abdomen is flat. Bowel sounds are normal. There is no distension.     Palpations: Abdomen is soft.     Tenderness: There is no abdominal tenderness.  Musculoskeletal:        General: No swelling. Normal range of motion.     Cervical back: Full passive range of motion without pain, normal range of motion and neck supple.     Right lower leg: No edema.     Left lower leg: No edema.  Lymphadenopathy:     Upper  Body:     Right upper body: No supraclavicular, axillary or pectoral adenopathy.     Left upper body: No supraclavicular, axillary or pectoral adenopathy.  Skin:    General: Skin is warm and dry.     Capillary Refill: Capillary refill takes less than 2 seconds.  Neurological:     General: No focal deficit present.     Mental Status: She is alert and oriented to person, place, and time.     Cranial Nerves: No cranial nerve deficit.     Sensory: No sensory deficit.  Psychiatric:        Mood and Affect: Mood normal.        Behavior: Behavior normal.        Thought Content: Thought content normal.        Judgment: Judgment normal.         Assessment And Plan:     1. Encounter for general adult medical examination w/o abnormal findings . Behavior modifications discussed and diet history reviewed.   . Pt will continue to exercise regularly and modify diet with low GI, plant based foods and decrease intake of processed foods.  . Recommend intake of daily multivitamin, Vitamin D, and calcium.  . Recommend mammogram and colonoscopy (both are up to date) for preventive screenings, as well as recommend immunizations that include influenza, TDAP (up to date)   2. Primary hypothyroidism  Chronic, stable  Continue with current medications  She had labs done in January  3. Essential hypertension, benign  Chronic, excellent control  Continue with current medications  Continue with a low salt diet - POCT Urinalysis Dipstick (81002) - POCT UA - Microalbumin - EKG  12-Lead     Patient was given opportunity to ask questions. Patient verbalized understanding of the plan and was able to repeat key elements of the plan. All questions were answered to their satisfaction.   Minette Brine, FNP   I, Minette Brine, FNP, have reviewed all documentation for this visit. The documentation on 09/09/20 for the exam, diagnosis, procedures, and orders are all accurate and complete.  THE PATIENT IS ENCOURAGED TO PRACTICE SOCIAL DISTANCING DUE TO THE COVID-19 PANDEMIC.

## 2020-09-09 NOTE — Patient Instructions (Signed)
Health Maintenance, Female Adopting a healthy lifestyle and getting preventive care are important in promoting health and wellness. Ask your health care provider about:  The right schedule for you to have regular tests and exams.  Things you can do on your own to prevent diseases and keep yourself healthy. What should I know about diet, weight, and exercise? Eat a healthy diet  Eat a diet that includes plenty of vegetables, fruits, low-fat dairy products, and lean protein.  Do not eat a lot of foods that are high in solid fats, added sugars, or sodium.   Maintain a healthy weight Body mass index (BMI) is used to identify weight problems. It estimates body fat based on height and weight. Your health care provider can help determine your BMI and help you achieve or maintain a healthy weight. Get regular exercise Get regular exercise. This is one of the most important things you can do for your health. Most adults should:  Exercise for at least 150 minutes each week. The exercise should increase your heart rate and make you sweat (moderate-intensity exercise).  Do strengthening exercises at least twice a week. This is in addition to the moderate-intensity exercise.  Spend less time sitting. Even light physical activity can be beneficial. Watch cholesterol and blood lipids Have your blood tested for lipids and cholesterol at 72 years of age, then have this test every 5 years. Have your cholesterol levels checked more often if:  Your lipid or cholesterol levels are high.  You are older than 72 years of age.  You are at high risk for heart disease. What should I know about cancer screening? Depending on your health history and family history, you may need to have cancer screening at various ages. This may include screening for:  Breast cancer.  Cervical cancer.  Colorectal cancer.  Skin cancer.  Lung cancer. What should I know about heart disease, diabetes, and high blood  pressure? Blood pressure and heart disease  High blood pressure causes heart disease and increases the risk of stroke. This is more likely to develop in people who have high blood pressure readings, are of African descent, or are overweight.  Have your blood pressure checked: ? Every 3-5 years if you are 18-39 years of age. ? Every year if you are 40 years old or older. Diabetes Have regular diabetes screenings. This checks your fasting blood sugar level. Have the screening done:  Once every three years after age 40 if you are at a normal weight and have a low risk for diabetes.  More often and at a younger age if you are overweight or have a high risk for diabetes. What should I know about preventing infection? Hepatitis B If you have a higher risk for hepatitis B, you should be screened for this virus. Talk with your health care provider to find out if you are at risk for hepatitis B infection. Hepatitis C Testing is recommended for:  Everyone born from 1945 through 1965.  Anyone with known risk factors for hepatitis C. Sexually transmitted infections (STIs)  Get screened for STIs, including gonorrhea and chlamydia, if: ? You are sexually active and are younger than 72 years of age. ? You are older than 72 years of age and your health care provider tells you that you are at risk for this type of infection. ? Your sexual activity has changed since you were last screened, and you are at increased risk for chlamydia or gonorrhea. Ask your health care provider   if you are at risk.  Ask your health care provider about whether you are at high risk for HIV. Your health care provider may recommend a prescription medicine to help prevent HIV infection. If you choose to take medicine to prevent HIV, you should first get tested for HIV. You should then be tested every 3 months for as long as you are taking the medicine. Pregnancy  If you are about to stop having your period (premenopausal) and  you may become pregnant, seek counseling before you get pregnant.  Take 400 to 800 micrograms (mcg) of folic acid every day if you become pregnant.  Ask for birth control (contraception) if you want to prevent pregnancy. Osteoporosis and menopause Osteoporosis is a disease in which the bones lose minerals and strength with aging. This can result in bone fractures. If you are 65 years old or older, or if you are at risk for osteoporosis and fractures, ask your health care provider if you should:  Be screened for bone loss.  Take a calcium or vitamin D supplement to lower your risk of fractures.  Be given hormone replacement therapy (HRT) to treat symptoms of menopause. Follow these instructions at home: Lifestyle  Do not use any products that contain nicotine or tobacco, such as cigarettes, e-cigarettes, and chewing tobacco. If you need help quitting, ask your health care provider.  Do not use street drugs.  Do not share needles.  Ask your health care provider for help if you need support or information about quitting drugs. Alcohol use  Do not drink alcohol if: ? Your health care provider tells you not to drink. ? You are pregnant, may be pregnant, or are planning to become pregnant.  If you drink alcohol: ? Limit how much you use to 0-1 drink a day. ? Limit intake if you are breastfeeding.  Be aware of how much alcohol is in your drink. In the [place] one drink equals one 12 oz bottle of beer (355 mL), one 5 oz glass of wine (148 mL), or one 1 oz glass of hard liquor (44 mL). General instructions  Schedule regular health, dental, and eye exams.  Stay current with your vaccines.  Tell your health care provider if: ? You often feel depressed. ? You have ever been abused or do not feel safe at home. Summary  Adopting a healthy lifestyle and getting preventive care are important in promoting health and wellness.  Follow your health care provider's instructions about healthy  diet, exercising, and getting tested or screened for diseases.  Follow your health care provider's instructions on monitoring your cholesterol and blood pressure. This information is not intended to replace advice given to you by your health care provider. Make sure you discuss any questions you have with your health care provider. Document Revised: 06/01/2018 Document Reviewed: 06/01/2018 Elsevier Patient Education  2021 [employer]  

## 2020-09-22 ENCOUNTER — Other Ambulatory Visit: Payer: Self-pay | Admitting: Internal Medicine

## 2020-10-01 ENCOUNTER — Encounter: Payer: Self-pay | Admitting: Internal Medicine

## 2020-10-02 ENCOUNTER — Other Ambulatory Visit: Payer: Self-pay

## 2020-10-02 ENCOUNTER — Ambulatory Visit
Admission: RE | Admit: 2020-10-02 | Discharge: 2020-10-02 | Disposition: A | Discharge status: Home / Self Care | Payer: Medicare HMO | Source: Ambulatory Visit | Attending: Internal Medicine | Admitting: Internal Medicine

## 2020-10-02 DIAGNOSIS — Z1231 Encounter for screening mammogram for malignant neoplasm of breast: Secondary | ICD-10-CM

## 2020-10-30 ENCOUNTER — Encounter: Payer: Self-pay | Admitting: Internal Medicine

## 2020-11-01 DIAGNOSIS — H5213 Myopia, bilateral: Secondary | ICD-10-CM | POA: Diagnosis not present

## 2020-11-05 DIAGNOSIS — M48062 Spinal stenosis, lumbar region with neurogenic claudication: Secondary | ICD-10-CM | POA: Diagnosis not present

## 2020-11-21 DIAGNOSIS — M48062 Spinal stenosis, lumbar region with neurogenic claudication: Secondary | ICD-10-CM | POA: Diagnosis not present

## 2020-11-25 DIAGNOSIS — H2513 Age-related nuclear cataract, bilateral: Secondary | ICD-10-CM | POA: Diagnosis not present

## 2020-11-25 DIAGNOSIS — H1045 Other chronic allergic conjunctivitis: Secondary | ICD-10-CM | POA: Diagnosis not present

## 2020-12-30 ENCOUNTER — Other Ambulatory Visit: Payer: Self-pay

## 2020-12-30 ENCOUNTER — Ambulatory Visit (INDEPENDENT_AMBULATORY_CARE_PROVIDER_SITE_OTHER): Payer: Medicare HMO | Admitting: Internal Medicine

## 2020-12-30 ENCOUNTER — Encounter: Payer: Self-pay | Admitting: Internal Medicine

## 2020-12-30 VITALS — BP 114/72 | HR 70 | Temp 97.9°F | Ht 63.6 in | Wt 158.8 lb

## 2020-12-30 DIAGNOSIS — H269 Unspecified cataract: Secondary | ICD-10-CM | POA: Diagnosis not present

## 2020-12-30 DIAGNOSIS — I10 Essential (primary) hypertension: Secondary | ICD-10-CM

## 2020-12-30 DIAGNOSIS — I499 Cardiac arrhythmia, unspecified: Secondary | ICD-10-CM | POA: Diagnosis not present

## 2020-12-30 DIAGNOSIS — H6121 Impacted cerumen, right ear: Secondary | ICD-10-CM | POA: Diagnosis not present

## 2020-12-30 DIAGNOSIS — G4700 Insomnia, unspecified: Secondary | ICD-10-CM | POA: Diagnosis not present

## 2020-12-30 DIAGNOSIS — W19XXXA Unspecified fall, initial encounter: Secondary | ICD-10-CM | POA: Diagnosis not present

## 2020-12-30 DIAGNOSIS — M1990 Unspecified osteoarthritis, unspecified site: Secondary | ICD-10-CM | POA: Diagnosis not present

## 2020-12-30 DIAGNOSIS — Z008 Encounter for other general examination: Secondary | ICD-10-CM | POA: Diagnosis not present

## 2020-12-30 DIAGNOSIS — G8929 Other chronic pain: Secondary | ICD-10-CM | POA: Diagnosis not present

## 2020-12-30 DIAGNOSIS — R0981 Nasal congestion: Secondary | ICD-10-CM | POA: Diagnosis not present

## 2020-12-30 DIAGNOSIS — Z7722 Contact with and (suspected) exposure to environmental tobacco smoke (acute) (chronic): Secondary | ICD-10-CM | POA: Diagnosis not present

## 2020-12-30 DIAGNOSIS — I739 Peripheral vascular disease, unspecified: Secondary | ICD-10-CM | POA: Diagnosis not present

## 2020-12-30 DIAGNOSIS — R002 Palpitations: Secondary | ICD-10-CM | POA: Diagnosis not present

## 2020-12-30 DIAGNOSIS — E039 Hypothyroidism, unspecified: Secondary | ICD-10-CM | POA: Diagnosis not present

## 2020-12-30 DIAGNOSIS — Z6827 Body mass index (BMI) 27.0-27.9, adult: Secondary | ICD-10-CM | POA: Diagnosis not present

## 2020-12-30 DIAGNOSIS — R2689 Other abnormalities of gait and mobility: Secondary | ICD-10-CM | POA: Diagnosis not present

## 2020-12-30 NOTE — Progress Notes (Signed)
I,Yamilka [employer] as a Education administrator for Maximino Greenland, MD.,have documented all relevant documentation on the behalf of Maximino Greenland, MD,as directed by  Maximino Greenland, MD while in the presence of Maximino Greenland, MD.  This visit occurred during the SARS-CoV-2 public health emergency.  Safety protocols were in place, including screening questions prior to the visit, additional usage of staff PPE, and extensive cleaning of exam room while observing appropriate contact time as indicated for disinfecting solutions.  Subjective:     Patient ID: Maria Hall , female    DOB: [birth year] , 70 y.o.   MRN: [id number]   Chief Complaint  Patient presents with   Hypertension    HPI  She presents today for blood pressure check. She reports compliance with meds. She denies headaches, chest pain and shortness of breath.   Hypertension This is a chronic problem. The current episode started more than 1 year ago. The problem has been gradually improving since onset. The problem is controlled. Pertinent negatives include no blurred vision, palpitations or peripheral edema. Past treatments include diuretics. The current treatment provides moderate improvement. There are no compliance problems.  Identifiable causes of hypertension include a thyroid problem.  Thyroid Problem Presents for follow-up visit. Patient reports no depressed mood, diaphoresis, menstrual problem, palpitations or tremors. The symptoms have been stable.    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 Take 2 caplets daily, Disp: , Rfl:    albuterol (VENTOLIN HFA) 108 (90 Base) MCG/ACT inhaler,  INHALE 2 PUFFS BY MOUTH EVERY 4 TO 6 HOURS AS NEEDED, Disp: 6.7 g, Rfl: 3   Ascorbic Acid (VITAMIN C) 1000 MG tablet, Take 1,000 mg by mouth daily., Disp: , Rfl:    aspirin EC 81 MG tablet, Take 81 mg by mouth daily., Disp: , Rfl:    cholecalciferol (VITAMIN D) 1000 units tablet, Take 1,000 Units by mouth daily., Disp: , Rfl:    Docusate Calcium (STOOL SOFTENER PO), Take 100 mg by mouth. Take 2 soft gels daily, Disp: , Rfl:    fluticasone (FLONASE) 50 MCG/ACT nasal spray, Place 1 spray into both nostrils daily., Disp: 48 mL, Rfl: 1   folic acid (FOLVITE) 1 MG tablet, Take 1 tablet (1 mg total) by mouth daily., Disp: 90 tablet, Rfl: 1   hydrochlorothiazide (HYDRODIURIL) 12.5 MG tablet, TAKE 1 TABLET BY MOUTH EVERY DAY, Disp: 90 tablet, Rfl: 1   HYDROcodone-acetaminophen (NORCO/VICODIN) 5-325 MG tablet, Take 1 tablet by mouth every 6 (six) hours as needed for moderate pain., Disp: 30 tablet, Rfl: 0   levothyroxine (SYNTHROID) 88 MCG tablet, TAKE 1 TABLET (88 MCG TOTAL) BY MOUTH DAILY BEFORE BREAKFAST. MON - SAT ONLY, Disp: 78 tablet, Rfl: 1   Omega 3-6-9 Fatty Acids (OMEGA 3-6-9 COMPLEX PO), Take 1,600 mg by mouth daily at 6 (six) AM., Disp: , Rfl:    traZODone (DESYREL) 100 MG tablet, TAKE 1 TABLET BY MOUTH EVERYDAY AT BEDTIME, Disp: 90 tablet, Rfl: 2   vitamin E 400 UNIT capsule, Take 400  Units by mouth daily., Disp: , Rfl:    No Known Allergies   Review of Systems  Constitutional: Negative.  Negative for diaphoresis.  Eyes:  Negative for blurred vision.  Respiratory: Negative.    Cardiovascular: Negative.  Negative for palpitations.  Gastrointestinal: Negative.   Genitourinary:  Negative for menstrual problem.  Neurological:  Positive for dizziness. Negative for tremors.       She admits to feeling dizzy on occasion the past 2-3 months. She is not sure what may have precipitated her sx; however, does admit that bending over may have caused one of her episodes. On two occasions, she has  fallen. First time, she states she tripped over her grandson. States she fell forward, was able to catch herself, and did not fall to the ground. She did not suffer any injuries. States she still feels "off balance" on occasion. She does not feel like the room is spinning.   Second incident, she was walking into the garage to let it down, bent over and fell to the ground. She did hurt herself, states she scraped her left knee. Admits she did not seek any medical attention.   Psychiatric/Behavioral: Negative.      Today's Vitals   12/30/20 1112  BP: 114/72  Pulse: 70  Temp: 97.9 F (36.6 C)  Weight: 158 lb 12.8 oz (72 kg)  Height: 5' 3.6" (1.615 m)  PainSc: 0-No pain   Body mass index is 27.6 kg/m.  Wt Readings from Last 3 Encounters:  12/30/20 158 lb 12.8 oz (72 kg)  09/09/20 154 lb (69.9 kg)  07/04/20 151 lb 9.6 oz (68.8 kg)     Objective:  Physical Exam Vitals and nursing note reviewed.  Constitutional:      Appearance: Normal appearance.  HENT:     Head: Normocephalic and atraumatic.     Right Ear: Ear canal and external ear normal. There is impacted cerumen.     Left Ear: Tympanic membrane, ear canal and external ear normal. There is no impacted cerumen.     Nose:     Comments: Masked     Mouth/Throat:     Comments: Masked  Cardiovascular:     Rate and Rhythm: Normal rate and regular rhythm.     Heart sounds: Normal heart sounds.  Pulmonary:     Effort: Pulmonary effort is normal.     Breath sounds: Normal breath sounds.  Skin:    General: Skin is warm.  Neurological:     General: No focal deficit present.     Mental Status: She is alert.  Psychiatric:        Mood and Affect: Mood normal.        Behavior: Behavior normal.        Assessment And Plan:     1. Essential hypertension, benign Comments: Chronic, well controlled. Orthostatics performed due to "imbalance". Advised to decrease meds to MWF dosing only. Will have her return for nurse visit in 1 wk. -  CMP14+EGFR - Lipid panel  2. Imbalance Comments: Ear exam revealed cerumen impaction of right ear. Pt advised this could be contributing to her sx. I will also check vitamin B12 level today.  - Vitamin B12  3. Fall, initial encounter Comments: Unfortunately, she is unable to identify dates - states falls occurred between April-June.   4. Palpitations Comments: She is encouraged to stay well hydrated. I will check thyroid status, may benefit from magnesium supplementation.  - CMP14+EGFR - CBC no Diff  5.  Primary hypothyroidism Comments: I will check thyroid panel and adjust meds as needed.  - Lipid panel - TSH - T4, free  6. Right ear impacted cerumen AFTER OBTAINING VERBAL CONSENT, RIGHT EAR WAS FLUSHED BY IRRIGATION. SHE TOLERATED PROCEDURE WELL WITHOUT ANY COMPLICATIONS. NO TM ABNORMALITIES WERE NOTED.  - Ear Lavage  7. BMI 27.0-27.9,adult Comments: Her BMI is acceptable for her demographic. She is encouraged to aim for at least 150 minutes of exercise per week.    Patient was given opportunity to ask questions. Patient verbalized understanding of the plan and was able to repeat key elements of the plan. All questions were answered to their satisfaction.   I, Maximino Greenland, MD, have reviewed all documentation for this visit. The documentation on 12/30/20 for the exam, diagnosis, procedures, and orders are all accurate and complete.   IF YOU HAVE BEEN REFERRED TO A SPECIALIST, IT MAY TAKE 1-2 WEEKS TO SCHEDULE/PROCESS THE REFERRAL. IF YOU HAVE NOT HEARD FROM US/SPECIALIST IN TWO WEEKS, PLEASE GIVE [place] A CALL AT [phone].   THE PATIENT IS ENCOURAGED TO PRACTICE SOCIAL DISTANCING DUE TO THE COVID-19 PANDEMIC.

## 2020-12-30 NOTE — Patient Instructions (Signed)

## 2020-12-31 LAB — CMP14+EGFR
ALT: 12 IU/L (ref 0–32)
AST: 21 IU/L (ref 0–40)
Albumin/Globulin Ratio: 1.7 (ref 1.2–2.2)
Albumin: 4 g/dL (ref 3.7–4.7)
Alkaline Phosphatase: 122 IU/L — ABNORMAL HIGH (ref 44–121)
BUN/Creatinine Ratio: 11 — ABNORMAL LOW (ref 12–28)
BUN: 10 mg/dL (ref 8–27)
Bilirubin Total: 0.4 mg/dL (ref 0.0–1.2)
CO2: 26 mmol/L (ref 20–29)
Calcium: 9.9 mg/dL (ref 8.7–10.3)
Chloride: 100 mmol/L (ref 96–106)
Creatinine, Ser: 0.92 mg/dL (ref 0.57–1.00)
Globulin, Total: 2.4 g/dL (ref 1.5–4.5)
Glucose: 88 mg/dL (ref 65–99)
Potassium: 4.3 mmol/L (ref 3.5–5.2)
Sodium: 141 mmol/L (ref 134–144)
Total Protein: 6.4 g/dL (ref 6.0–8.5)
eGFR: 67 mL/min/{1.73_m2} (ref >59)

## 2020-12-31 LAB — LIPID PANEL
Chol/HDL Ratio: 4 ratio (ref 0.0–4.4)
Cholesterol, Total: 196 mg/dL (ref 100–199)
HDL: 49 mg/dL (ref >39)
LDL Chol Calc (NIH): 121 mg/dL — ABNORMAL HIGH (ref 0–99)
Triglycerides: 144 mg/dL (ref 0–149)
VLDL Cholesterol Cal: 26 mg/dL (ref 5–40)

## 2020-12-31 LAB — CBC
Hematocrit: 38.2 % (ref 34.0–46.6)
Hemoglobin: 12.9 g/dL (ref 11.1–15.9)
MCH: 29.4 pg (ref 26.6–33.0)
MCHC: 33.8 g/dL (ref 31.5–35.7)
MCV: 87 fL (ref 79–97)
Platelets: 446 10*3/uL (ref 150–450)
RBC: 4.39 x10E6/uL (ref 3.77–5.28)
RDW: 14.2 % (ref 11.7–15.4)
WBC: 7.5 10*3/uL (ref 3.4–10.8)

## 2020-12-31 LAB — TSH: TSH: 2.55 u[IU]/mL (ref 0.450–4.500)

## 2020-12-31 LAB — T4, FREE: Free T4: 1.06 ng/dL (ref 0.82–1.77)

## 2020-12-31 LAB — VITAMIN B12: Vitamin B-12: 810 pg/mL (ref 232–1245)

## 2021-01-03 ENCOUNTER — Encounter: Payer: Self-pay | Admitting: Internal Medicine

## 2021-01-07 ENCOUNTER — Other Ambulatory Visit: Payer: Self-pay

## 2021-01-07 ENCOUNTER — Ambulatory Visit: Payer: Medicare HMO

## 2021-01-07 VITALS — Temp 97.7°F | Ht 63.0 in | Wt 156.6 lb

## 2021-01-07 DIAGNOSIS — I10 Essential (primary) hypertension: Secondary | ICD-10-CM

## 2021-01-07 NOTE — Progress Notes (Signed)
Pt is here today for BPC. She currently takes hydrochlorothiazide 12.5. She denies headache or dizziness. We are performing orthostatics today.   Sitting: 110/72     p: 84 Standing: 114/70 P: 104 Lying: 110/70      P: 74 Pt was told by provider to take med mon-fri except for weekends. On the weekends when she does take the med she was told to take her BP at home. With her monitor, she was given ext 206 to report readings on Monday.

## 2021-01-28 DIAGNOSIS — M9902 Segmental and somatic dysfunction of thoracic region: Secondary | ICD-10-CM | POA: Diagnosis not present

## 2021-01-28 DIAGNOSIS — M9903 Segmental and somatic dysfunction of lumbar region: Secondary | ICD-10-CM | POA: Diagnosis not present

## 2021-01-28 DIAGNOSIS — M9904 Segmental and somatic dysfunction of sacral region: Secondary | ICD-10-CM | POA: Diagnosis not present

## 2021-01-28 DIAGNOSIS — M9901 Segmental and somatic dysfunction of cervical region: Secondary | ICD-10-CM | POA: Diagnosis not present

## 2021-02-05 DIAGNOSIS — M9904 Segmental and somatic dysfunction of sacral region: Secondary | ICD-10-CM | POA: Diagnosis not present

## 2021-02-05 DIAGNOSIS — M9903 Segmental and somatic dysfunction of lumbar region: Secondary | ICD-10-CM | POA: Diagnosis not present

## 2021-02-05 DIAGNOSIS — M9901 Segmental and somatic dysfunction of cervical region: Secondary | ICD-10-CM | POA: Diagnosis not present

## 2021-02-05 DIAGNOSIS — M9902 Segmental and somatic dysfunction of thoracic region: Secondary | ICD-10-CM | POA: Diagnosis not present

## 2021-02-11 DIAGNOSIS — M9901 Segmental and somatic dysfunction of cervical region: Secondary | ICD-10-CM | POA: Diagnosis not present

## 2021-02-11 DIAGNOSIS — M9903 Segmental and somatic dysfunction of lumbar region: Secondary | ICD-10-CM | POA: Diagnosis not present

## 2021-02-11 DIAGNOSIS — M9902 Segmental and somatic dysfunction of thoracic region: Secondary | ICD-10-CM | POA: Diagnosis not present

## 2021-02-11 DIAGNOSIS — M9904 Segmental and somatic dysfunction of sacral region: Secondary | ICD-10-CM | POA: Diagnosis not present

## 2021-02-15 ENCOUNTER — Other Ambulatory Visit: Payer: Self-pay | Admitting: Internal Medicine

## 2021-02-15 ENCOUNTER — Other Ambulatory Visit: Payer: Self-pay | Admitting: Nurse Practitioner

## 2021-02-15 DIAGNOSIS — D573 Sickle-cell trait: Secondary | ICD-10-CM

## 2021-02-15 DIAGNOSIS — D473 Essential (hemorrhagic) thrombocythemia: Secondary | ICD-10-CM

## 2021-02-15 DIAGNOSIS — G4700 Insomnia, unspecified: Secondary | ICD-10-CM

## 2021-02-17 DIAGNOSIS — M9901 Segmental and somatic dysfunction of cervical region: Secondary | ICD-10-CM | POA: Diagnosis not present

## 2021-02-17 DIAGNOSIS — M9902 Segmental and somatic dysfunction of thoracic region: Secondary | ICD-10-CM | POA: Diagnosis not present

## 2021-02-17 DIAGNOSIS — M9903 Segmental and somatic dysfunction of lumbar region: Secondary | ICD-10-CM | POA: Diagnosis not present

## 2021-02-17 DIAGNOSIS — R69 Illness, unspecified: Secondary | ICD-10-CM | POA: Diagnosis not present

## 2021-02-17 DIAGNOSIS — M9904 Segmental and somatic dysfunction of sacral region: Secondary | ICD-10-CM | POA: Diagnosis not present

## 2021-02-25 DIAGNOSIS — M9904 Segmental and somatic dysfunction of sacral region: Secondary | ICD-10-CM | POA: Diagnosis not present

## 2021-02-25 DIAGNOSIS — M9902 Segmental and somatic dysfunction of thoracic region: Secondary | ICD-10-CM | POA: Diagnosis not present

## 2021-02-25 DIAGNOSIS — M9903 Segmental and somatic dysfunction of lumbar region: Secondary | ICD-10-CM | POA: Diagnosis not present

## 2021-02-25 DIAGNOSIS — M9901 Segmental and somatic dysfunction of cervical region: Secondary | ICD-10-CM | POA: Diagnosis not present

## 2021-03-03 DIAGNOSIS — F330 Major depressive disorder, recurrent, mild: Secondary | ICD-10-CM | POA: Diagnosis not present

## 2021-03-03 DIAGNOSIS — R69 Illness, unspecified: Secondary | ICD-10-CM | POA: Diagnosis not present

## 2021-03-04 DIAGNOSIS — M9903 Segmental and somatic dysfunction of lumbar region: Secondary | ICD-10-CM | POA: Diagnosis not present

## 2021-03-04 DIAGNOSIS — M9904 Segmental and somatic dysfunction of sacral region: Secondary | ICD-10-CM | POA: Diagnosis not present

## 2021-03-04 DIAGNOSIS — M9901 Segmental and somatic dysfunction of cervical region: Secondary | ICD-10-CM | POA: Diagnosis not present

## 2021-03-04 DIAGNOSIS — M9902 Segmental and somatic dysfunction of thoracic region: Secondary | ICD-10-CM | POA: Diagnosis not present

## 2021-03-17 DIAGNOSIS — R69 Illness, unspecified: Secondary | ICD-10-CM | POA: Diagnosis not present

## 2021-03-17 DIAGNOSIS — F330 Major depressive disorder, recurrent, mild: Secondary | ICD-10-CM | POA: Diagnosis not present

## 2021-03-24 ENCOUNTER — Encounter (HOSPITAL_BASED_OUTPATIENT_CLINIC_OR_DEPARTMENT_OTHER): Payer: Self-pay

## 2021-03-24 ENCOUNTER — Emergency Department (HOSPITAL_BASED_OUTPATIENT_CLINIC_OR_DEPARTMENT_OTHER): Payer: Medicare HMO

## 2021-03-24 ENCOUNTER — Emergency Department (HOSPITAL_COMMUNITY): Payer: Medicare HMO

## 2021-03-24 ENCOUNTER — Other Ambulatory Visit: Payer: Self-pay

## 2021-03-24 ENCOUNTER — Emergency Department (HOSPITAL_BASED_OUTPATIENT_CLINIC_OR_DEPARTMENT_OTHER)
Admission: EM | Admit: 2021-03-24 | Discharge: 2021-03-25 | Disposition: A | Discharge status: Home / Self Care | Payer: Medicare HMO | Attending: Emergency Medicine | Admitting: Emergency Medicine

## 2021-03-24 DIAGNOSIS — R9431 Abnormal electrocardiogram [ECG] [EKG]: Secondary | ICD-10-CM | POA: Diagnosis not present

## 2021-03-24 DIAGNOSIS — R42 Dizziness and giddiness: Secondary | ICD-10-CM | POA: Diagnosis not present

## 2021-03-24 DIAGNOSIS — Z79899 Other long term (current) drug therapy: Secondary | ICD-10-CM | POA: Insufficient documentation

## 2021-03-24 DIAGNOSIS — I10 Essential (primary) hypertension: Secondary | ICD-10-CM | POA: Insufficient documentation

## 2021-03-24 DIAGNOSIS — I6602 Occlusion and stenosis of left middle cerebral artery: Secondary | ICD-10-CM | POA: Diagnosis not present

## 2021-03-24 DIAGNOSIS — R2681 Unsteadiness on feet: Secondary | ICD-10-CM | POA: Diagnosis present

## 2021-03-24 DIAGNOSIS — I6782 Cerebral ischemia: Secondary | ICD-10-CM | POA: Insufficient documentation

## 2021-03-24 DIAGNOSIS — Z20822 Contact with and (suspected) exposure to covid-19: Secondary | ICD-10-CM | POA: Diagnosis not present

## 2021-03-24 DIAGNOSIS — E039 Hypothyroidism, unspecified: Secondary | ICD-10-CM | POA: Diagnosis not present

## 2021-03-24 DIAGNOSIS — Z87891 Personal history of nicotine dependence: Secondary | ICD-10-CM | POA: Insufficient documentation

## 2021-03-24 DIAGNOSIS — R270 Ataxia, unspecified: Secondary | ICD-10-CM

## 2021-03-24 DIAGNOSIS — I6523 Occlusion and stenosis of bilateral carotid arteries: Secondary | ICD-10-CM | POA: Diagnosis not present

## 2021-03-24 DIAGNOSIS — Z7982 Long term (current) use of aspirin: Secondary | ICD-10-CM | POA: Diagnosis not present

## 2021-03-24 DIAGNOSIS — H814 Vertigo of central origin: Secondary | ICD-10-CM | POA: Diagnosis not present

## 2021-03-24 LAB — BASIC METABOLIC PANEL
Anion gap: 7 (ref 5–15)
BUN: 10 mg/dL (ref 8–23)
CO2: 37 mmol/L — ABNORMAL HIGH (ref 22–32)
Calcium: 10.1 mg/dL (ref 8.9–10.3)
Chloride: 97 mmol/L — ABNORMAL LOW (ref 98–111)
Creatinine, Ser: 0.8 mg/dL (ref 0.44–1.00)
GFR, Estimated: >60 mL/min (ref >60)
Glucose, Bld: 85 mg/dL (ref 70–99)
Potassium: 3.2 mmol/L — ABNORMAL LOW (ref 3.5–5.1)
Sodium: 141 mmol/L (ref 135–145)

## 2021-03-24 LAB — CBC
HCT: 40.7 % (ref 36.0–46.0)
Hemoglobin: 13.5 g/dL (ref 12.0–15.0)
MCH: 28.2 pg (ref 26.0–34.0)
MCHC: 33.2 g/dL (ref 30.0–36.0)
MCV: 85 fL (ref 80.0–100.0)
Platelets: 589 10*3/uL — ABNORMAL HIGH (ref 150–400)
RBC: 4.79 MIL/uL (ref 3.87–5.11)
RDW: 13.9 % (ref 11.5–15.5)
WBC: 7.5 10*3/uL (ref 4.0–10.5)
nRBC: 0 % (ref 0.0–0.2)

## 2021-03-24 LAB — URINALYSIS, ROUTINE W REFLEX MICROSCOPIC
Bilirubin Urine: NEGATIVE
Glucose, UA: NEGATIVE mg/dL
Hgb urine dipstick: NEGATIVE
Ketones, ur: NEGATIVE mg/dL
Leukocytes,Ua: NEGATIVE
Nitrite: NEGATIVE
Specific Gravity, Urine: 1.014 (ref 1.005–1.030)
pH: 8 (ref 5.0–8.0)

## 2021-03-24 LAB — CBG MONITORING, ED: Glucose-Capillary: 93 mg/dL (ref 70–99)

## 2021-03-24 LAB — RESP PANEL BY RT-PCR (FLU A&B, COVID) ARPGX2
Influenza A by PCR: NEGATIVE
Influenza B by PCR: NEGATIVE
SARS Coronavirus 2 by RT PCR: NEGATIVE

## 2021-03-24 MED ORDER — MECLIZINE HCL 25 MG PO TABS
25.0000 mg | ORAL_TABLET | Freq: Once | ORAL | Status: AC
  Administered 2021-03-25: 25 mg via ORAL
  Filled 2021-03-24: qty 1

## 2021-03-24 NOTE — ED Notes (Signed)
Report given to [facility]  

## 2021-03-24 NOTE — ED Notes (Signed)
Pt in MRI.

## 2021-03-24 NOTE — ED Triage Notes (Signed)
Patient here POV from Home with Dizziness since this AM.  Patient states she was at the Gym earlier this AM and on the way Home and now upon standing she is having Dizziness/Lightheadedness. Mild Nausea earlier.   NAD Noted during Triage. A&Ox4. GCS 15. No Neurological Deficits noted during Triage. No Pain. No Acute Trauma.

## 2021-03-24 NOTE — ED Notes (Signed)
Pt here with acute onset of dizziness and feeling like everything is spinning starting at 0900 this AM. Has affected her gait. Pt AxO x4 with a GCS of 15. Denies pain. Call light within reach, pt changed into gown and jewelry removed and side rails up. Denies further needs.

## 2021-03-24 NOTE — ED Provider Notes (Signed)
[facility] EMERGENCY DEPT Provider Note   CSN: [id number] Arrival date & time: 03/24/21  1429     History Chief Complaint  Patient presents with   Dizziness    Maria Hall is a 70 y.o. female.  70 year old female presents for evaluation of dizzy spells today.  Patient states that she went to the gym this morning and had her usual morning workout, when she got home at about 9 AM today she felt "foggy headed and somewhat unwell."  Patient was able to ambulate into her house where she had an episode of dizziness described as spinning very fast.  This episode lasted for about 4 minutes and then the spinning slowed down and resolved.  Patient then went for a ride to the store.  States when she got out of the car she felt like her equilibrium was off, both legs felt unsteady, she was able to ambulate in the store using a shopping cart.  Patient got home and had another episode of spinning sensation.  This resolved after a few minutes and she went to her primary care office who sent her to the emergency room.  While waiting in the emergency room, has had an additional episode of spinning sensation.  She denies any unilateral weakness or numbness.  Husband is at bedside, denies any changes in speech.  No history of recent vaccine or illness, no history of prior vertigo.  No other complaints or concerns.  Patient is generally in great health, takes a fluid pill and a thyroid pill.  No other medical history.      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   Bronchitis    gets every year in the winter   Insomnia    takes meds   Right rotator cuff tear    Swelling    in hands and ankles   Thyroid disease     Patient Active Problem List   Diagnosis Date Noted   Essential hypertension, benign 06/21/2019   Other abnormal glucose 06/21/2019   Primary hypothyroidism 06/21/2019   Sickle-cell trait ([place]) 11/24/2016    Past Surgical  History:  Procedure Laterality Date   ABDOMINAL HYSTERECTOMY     BUNIONECTOMY     bil feet   CESAREAN SECTION     CESAREAN SECTION     1 time   CHOLECYSTECTOMY     COLONOSCOPY     tummy tuck       OB History   No obstetric history on file.     Family History  Adopted: Yes    Social History   Tobacco Use   Smoking status: Former    Packs/day: 1.00    Years: 20.00    Pack years: 20.00    Types: Cigarettes    Quit date: 1990    Years since quitting: 32.7   Smokeless tobacco: Never  Vaping Use   Vaping Use: Never used  Substance Use Topics   Alcohol use: Not Currently   Drug use: No    Home Medications Prior to Admission medications   Medication Sig Start Date End Date Taking? Authorizing Provider  acetaminophen (TYLENOL) 650 MG CR tablet Take 650 mg by mouth. Take 2 caplets daily    [provider]  Ascorbic Acid (VITAMIN C) 1000 MG tablet Take 1,000 mg by mouth daily.    [provider]  aspirin EC 81 MG tablet Take 81 mg by mouth daily.  [provider]  cholecalciferol (VITAMIN D) 1000 units tablet Take 1,000 Units by mouth daily.    [provider]  Docusate Calcium (STOOL SOFTENER PO) Take 100 mg by mouth. Take 2 soft gels daily    [provider]  folic acid (FOLVITE) 1 MG tablet TAKE 1 TABLET BY MOUTH EVERY DAY 02/17/21   Glendale Chard, MD  hydrochlorothiazide (HYDRODIURIL) 12.5 MG tablet TAKE 1 TABLET BY MOUTH EVERY DAY 09/23/20   Minette Brine, FNP  HYDROcodone-acetaminophen (NORCO/VICODIN) 5-325 MG tablet Take 1 tablet by mouth every 6 (six) hours as needed for moderate pain. 12/20/19   [place]ale Chard, MD  levothyroxine (SYNTHROID) 88 MCG tablet TAKE 1 TABLET (88 MCG TOTAL) BY MOUTH DAILY BEFORE BREAKFAST. MON - SAT ONLY 02/17/21   Minette Brine, FNP  meclizine (ANTIVERT) 12.5 MG tablet Take 1 tablet (12.5 mg total) by mouth 3 (three) times daily as needed for dizziness. 03/25/21   Minette Brine, FNP  Omega 3-6-9  Fatty Acids (OMEGA 3-6-9 COMPLEX PO) Take 1,600 mg by mouth daily at 6 (six) AM.    [provider]  ondansetron (ZOFRAN) 4 MG tablet Take 1 tablet (4 mg total) by mouth daily as needed for nausea or vomiting. 03/25/21 03/25/22  Minette Brine, FNP  vitamin E 400 UNIT capsule Take 400 Units by mouth daily.    [provider]    Allergies    Patient has no known allergies.  Review of Systems   Review of Systems  Constitutional:  Negative for fever.  Eyes:  Negative for visual disturbance.  Respiratory:  Negative for shortness of breath.   Cardiovascular:  Negative for chest pain.  Gastrointestinal:  Negative for abdominal pain, constipation, diarrhea, nausea and vomiting.  Genitourinary:  Negative for dysuria and frequency.  Musculoskeletal:  Positive for gait problem. Negative for back pain, neck pain and neck stiffness.  Skin:  Negative for rash and wound.  Allergic/Immunologic: Negative for immunocompromised state.  Neurological:  Positive for dizziness and light-headedness. Negative for facial asymmetry, speech difficulty, weakness, numbness and headaches.  Hematological:  Does not bruise/bleed easily.  Psychiatric/Behavioral:  Negative for confusion.   All other systems reviewed and are negative.  Physical Exam Updated Vital Signs BP 122/64   Pulse 68   Temp 97.6 F (36.4 C) (Oral)   Resp 16   Ht 5\' 6"  (1.676 m)   Wt 68 kg   SpO2 97%   BMI 24.21 kg/m   Physical Exam Vitals and nursing note reviewed.  Constitutional:      General: She is not in acute distress.    Appearance: She is well-developed. She is not diaphoretic.  HENT:     Head: Normocephalic and atraumatic.     Right Ear: Tympanic membrane and ear canal normal.     Left Ear: Tympanic membrane and ear canal normal.     Mouth/Throat:     Mouth: Mucous membranes are moist.  Eyes:     Extraocular Movements: Extraocular movements intact.     Conjunctiva/sclera: Conjunctivae normal.     Pupils:  Pupils are equal, round, and reactive to light.  Cardiovascular:     Rate and Rhythm: Normal rate and regular rhythm.     Pulses: Normal pulses.     Heart sounds: Normal heart sounds.  Pulmonary:     Effort: Pulmonary effort is normal.     Breath sounds: Normal breath sounds.  Abdominal:     Palpations: Abdomen is soft.     Tenderness: There  is no abdominal tenderness.  Musculoskeletal:     Right lower leg: No edema.     Left lower leg: No edema.  Skin:    General: Skin is warm and dry.     Findings: No erythema or rash.  Neurological:     Mental Status: She is alert and oriented to person, place, and time.     Cranial Nerves: No cranial nerve deficit.     Sensory: No sensory deficit.     Motor: No weakness.     Coordination: Coordination normal.     Gait: Gait normal.  Psychiatric:        Behavior: Behavior normal.    ED Results / Procedures / Treatments   Labs (all labs ordered are listed, but only abnormal results are displayed) Labs Reviewed  BASIC METABOLIC PANEL - Abnormal; Notable for the following components:      Result Value   Potassium 3.2 (*)    Chloride 97 (*)    CO2 37 (*)    All other components within normal limits  CBC - Abnormal; Notable for the following components:   Platelets 589 (*)    All other components within normal limits  URINALYSIS, ROUTINE W REFLEX MICROSCOPIC - Abnormal; Notable for the following components:   Protein, ur TRACE (*)    All other components within normal limits  RESP PANEL BY RT-PCR (FLU A&B, COVID) ARPGX2  CBG MONITORING, ED    EKG EKG Interpretation  Date/Time:  Monday March 24 2021 15:18:59 EDT Ventricular Rate:  76 PR Interval:  164 QRS Duration: 76 QT Interval:  404 QTC Calculation: 454 R Axis:   62 Text Interpretation: Sinus rhythm with occasional Premature ventricular complexes Otherwise normal ECG Confirmed by Merrily Pew [phone]) on 03/26/2021 4:49:44 AM  Radiology No results  found.  Procedures Procedures   Medications Ordered in ED Medications  meclizine (ANTIVERT) tablet 25 mg (25 mg Oral Given 03/25/21 0016)    ED Course  I have reviewed the triage vital signs and the nursing notes.  Pertinent labs & imaging results that were available during my care of the patient were reviewed by me and considered in my medical decision making (see chart for details).  Clinical Course as of 03/28/21 1410  Mon Mar 25, 2755  [id number] 70 year old female with complaint of severe head spinning episodes today, last known well at 9 AM.  Also reports 1 episode of ataxia.  Symptoms are not reproducible with changes in position. Neuro exam normal, not feeling spinning sensation at time of exam. Head CT unremarkable. Case discussed with Dr. Cheral Marker on-call with neuro hospitalist service who recommends ED to ED transfer for MRI, MRA head without contrast.  ED can call with results when done. [LM]  2026 Discussed results and plan of care with patient.  Case discussed with Dr. Langston Masker, ER attending at [facility] who accepts in transfer. [LM]    Clinical Course User Index [LM] Roque Lias   MDM Rules/Calculators/A&P                            Final Clinical Impression(s) / ED Diagnoses Final diagnoses:  Vertigo    Rx / DC Orders ED Discharge Orders     None        Tacy Learn, PA-C 03/28/21 1410    Fredia Sorrow, MD 04/01/21 1540

## 2021-03-25 ENCOUNTER — Telehealth (INDEPENDENT_AMBULATORY_CARE_PROVIDER_SITE_OTHER): Payer: Medicare HMO | Admitting: Nurse Practitioner

## 2021-03-25 ENCOUNTER — Encounter: Payer: Self-pay | Admitting: Nurse Practitioner

## 2021-03-25 VITALS — BP 129/72 | HR 72 | Wt 155.0 lb

## 2021-03-25 DIAGNOSIS — I6523 Occlusion and stenosis of bilateral carotid arteries: Secondary | ICD-10-CM | POA: Diagnosis not present

## 2021-03-25 DIAGNOSIS — E876 Hypokalemia: Secondary | ICD-10-CM

## 2021-03-25 DIAGNOSIS — R110 Nausea: Secondary | ICD-10-CM | POA: Diagnosis not present

## 2021-03-25 DIAGNOSIS — R42 Dizziness and giddiness: Secondary | ICD-10-CM | POA: Diagnosis not present

## 2021-03-25 DIAGNOSIS — I6602 Occlusion and stenosis of left middle cerebral artery: Secondary | ICD-10-CM | POA: Diagnosis not present

## 2021-03-25 MED ORDER — ONDANSETRON HCL 4 MG PO TABS: 4.0000 mg | ORAL_TABLET | Freq: Every day | ORAL | 1 refills | Status: AC | PRN

## 2021-03-25 MED ORDER — MECLIZINE HCL 12.5 MG PO TABS: 12.5000 mg | ORAL_TABLET | Freq: Three times a day (TID) | ORAL | 0 refills | Status: DC | PRN

## 2021-03-25 NOTE — Progress Notes (Signed)
Virtual Visit via MyChart video   [place] as a Education administrator for Minette Brine, FNP.,have documented all relevant documentation on the behalf of Minette Brine, FNP,as directed by  Minette Brine, FNP while in the presence of Minette Brine, [place].   This visit type was conducted due to national recommendations for restrictions regarding the COVID-19 Pandemic (e.g. social distancing) in an effort to limit this patient's exposure and mitigate transmission in our community.  Due to her co-morbid illnesses, this patient is at least at moderate risk for complications without adequate follow up.  This format is felt to be most appropriate for this patient at this time.  All issues noted in this document were discussed and addressed.  A limited physical exam was performed with this format.    This visit type was conducted due to national recommendations for restrictions regarding the COVID-19 Pandemic (e.g. social distancing) in an effort to limit this patient's exposure and mitigate transmission in our community.  Patients identity confirmed using two different identifiers.  This format is felt to be most appropriate for this patient at this time.  All issues noted in this document were discussed and addressed.  No physical exam was performed (except for noted visual exam findings with Video Visits).    Date:  03/25/2021   ID:  Maria Hall, DOB 28-Aug-1950, MRN [id number]  Patient Location:  Home - spoke with Maria Hall  Provider location:   Office    Chief Complaint:  vertigo  History of Present Illness:    Maria Hall is a 70 y.o. female who presents via video conferencing for a telehealth visit today.    The patient does not have symptoms concerning for COVID-19 infection (fever, chills, cough, or new shortness of breath).   Symptoms began yesterday morning about 9a, when she walks she is off balance and is unsteady, head is spinning and dizzy, intermittent  episodes. She is also nauseated. She does not have any vision changes. Denies headache.   She was seen in the ER and given meclizine 1 tab. Her potassium was also a little low at 3.2.  She is taking a fluid pill. she is taking meclizine mon - Fri and holds on Sat/Sun.      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   Bronchitis    gets every year in the winter   Insomnia    takes meds   Right rotator cuff tear    Swelling    in hands and ankles   Thyroid disease    Past Surgical History:  Procedure Laterality Date   ABDOMINAL HYSTERECTOMY     BUNIONECTOMY     bil feet   CESAREAN SECTION     CESAREAN SECTION     1 time   CHOLECYSTECTOMY     COLONOSCOPY     tummy tuck       Current Meds  Medication Sig   acetaminophen (TYLENOL) 650 MG CR tablet Take 650 mg by mouth. Take 2 caplets daily   Ascorbic Acid (VITAMIN C) 1000 MG tablet Take 1,000 mg by mouth daily.   aspirin EC 81 MG tablet Take 81 mg by mouth daily.   cholecalciferol (VITAMIN D) 1000 units tablet Take 1,000 Units by mouth daily.   Docusate Calcium (STOOL SOFTENER PO) Take 100 mg by mouth. Take 2 soft gels daily   folic acid (FOLVITE) 1 MG tablet TAKE 1 TABLET BY MOUTH  EVERY DAY   hydrochlorothiazide (HYDRODIURIL) 12.5 MG tablet TAKE 1 TABLET BY MOUTH EVERY DAY   HYDROcodone-acetaminophen (NORCO/VICODIN) 5-325 MG tablet Take 1 tablet by mouth every 6 (six) hours as needed for moderate pain.   levothyroxine (SYNTHROID) 88 MCG tablet TAKE 1 TABLET (88 MCG TOTAL) BY MOUTH DAILY BEFORE BREAKFAST. MON - SAT ONLY   meclizine (ANTIVERT) 12.5 MG tablet Take 1 tablet (12.5 mg total) by mouth 3 (three) times daily as needed for dizziness.   Omega 3-6-9 Fatty Acids (OMEGA 3-6-9 COMPLEX PO) Take 1,600 mg by mouth daily at 6 (six) AM.   ondansetron (ZOFRAN) 4 MG tablet Take 1 tablet (4 mg total) by mouth daily as needed for nausea or vomiting.   vitamin E 400 UNIT capsule Take 400 Units by mouth  daily.     Allergies:   Patient has no known allergies.   Social History   Tobacco Use   Smoking status: Former    Packs/day: 1.00    Years: 20.00    Pack years: 20.00    Types: Cigarettes    Quit date: 1990    Years since quitting: 32.7   Smokeless tobacco: Never  Vaping Use   Vaping Use: Never used  Substance Use Topics   Alcohol use: Not Currently   Drug use: No     Family Hx: The patient's family history is not on file. She was adopted.  ROS:   Please see the history of present illness.    Review of Systems  Constitutional: Negative.   Respiratory: Negative.    Cardiovascular: Negative.   Gastrointestinal:  Positive for nausea.  Neurological:  Positive for dizziness. Negative for tingling and weakness.  Psychiatric/Behavioral: Negative.     All other systems reviewed and are negative.   Labs/Other Tests and Data Reviewed:    Recent Labs: 12/30/2020: ALT 12; TSH 2.550 03/24/2021: BUN 10; Creatinine, Ser 0.80; Hemoglobin 13.5; Platelets 589; Potassium 3.2; Sodium 141   Recent Lipid Panel Lab Results  Component Value Date/Time   CHOL 196 12/30/2020 12:23 PM   TRIG 144 12/30/2020 12:23 PM   HDL 49 12/30/2020 12:23 PM   CHOLHDL 4.0 12/30/2020 12:23 PM   LDLCALC 121 (H) 12/30/2020 12:23 PM    Wt Readings from Last 3 Encounters:  03/25/21 155 lb (70.3 kg)  03/24/21 150 lb (68 kg)  01/07/21 156 lb 9.6 oz (71 kg)     Exam:    Vital Signs:  BP 129/72 (BP Location: Left Arm, Patient Position: Sitting, Cuff Size: Normal)   Pulse 72   Wt 155 lb (70.3 kg)   BMI 25.02 kg/m     Physical Exam Constitutional:      General: She is not in acute distress.    Appearance: Normal appearance.  Pulmonary:     Effort: Pulmonary effort is normal. No respiratory distress.  Neurological:     General: No focal deficit present.     Mental Status: She is alert and oriented to person, place, and time. Mental status is at baseline.     Cranial Nerves: No cranial nerve  deficit.  Psychiatric:        Mood and Affect: Mood and affect normal.        Behavior: Behavior normal.        Thought Content: Thought content normal.        Cognition and Memory: Memory normal.        Judgment: Judgment normal.    ASSESSMENT & PLAN:      1. Vertigo Continues to have symptoms after being seen in ER, sent Rx for meclizine encouraged to avoid driving especially when taking meclizine - ondansetron (ZOFRAN) 4 MG tablet; Take 1 tablet (4 mg total) by mouth daily as needed for nausea or vomiting.  Dispense: 30 tablet; Refill: 1 - meclizine (ANTIVERT) 12.5 MG tablet; Take 1 tablet (12.5 mg total) by mouth 3 (three) times daily as needed for dizziness.  Dispense: 30 tablet; Refill: 0  2. Nausea Rx for zofran sent to pharmacy, encouraged to eat a bland diet - meclizine (ANTIVERT) 12.5 MG tablet; Take 1 tablet (12.5 mg total) by mouth 3 (three) times daily as needed for dizziness.  Dispense: 30 tablet; Refill: 0  3. Hypokalemia Potassium was 3.2, she will increase intake of potassium rich foods and come for lab visit next week when feeling better - BMP8+eGFR; Future   COVID-19 Education: The signs and symptoms of COVID-19 were discussed with the patient and how to seek care for testing (follow up with PCP or arrange E-visit).  The importance of social distancing was discussed today.  Patient Risk:   After full review of this patients clinical status, I feel that they are at least moderate risk at this time.  Time:   Today, I have spent 9 minutes/ seconds with the patient with telehealth technology discussing above diagnoses.     Medication Adjustments/Labs and Tests Ordered: Current medicines are reviewed at length with the patient today.  Concerns regarding medicines are outlined above.   Tests Ordered: Orders Placed This Encounter  Procedures   BMP8+eGFR    Medication Changes: Meds ordered this encounter  Medications   ondansetron (ZOFRAN) 4 MG tablet    Sig:  Take 1 tablet (4 mg total) by mouth daily as needed for nausea or vomiting.    Dispense:  30 tablet    Refill:  1   meclizine (ANTIVERT) 12.5 MG tablet    Sig: Take 1 tablet (12.5 mg total) by mouth 3 (three) times daily as needed for dizziness.    Dispense:  30 tablet    Refill:  0    Disposition:  Follow up prn  Signed, Janece Moore, FNP    

## 2021-03-25 NOTE — Patient Instructions (Signed)
Vertigo Vertigo is the feeling that you or the things around you are moving when they are not. This feeling can come and go at any time. Vertigo often goes away on its own. This condition can be dangerous if it happens when you are doingactivities like driving or working with machines. Your doctor will do tests to find the cause of your vertigo. These tests willalso help your doctor decide on the best treatment for you. Follow these instructions at home: Eating and drinking     Drink enough fluid to keep your pee (urine) pale yellow. Do not drink alcohol. Activity Return to your normal activities when your doctor says that it is safe. In the morning, first sit up on the side of the bed. When you feel okay, stand slowly while you hold onto something until you know that your balance is fine. Move slowly. Avoid sudden body or head movements or certain positions, as told by your doctor. Use a cane if you have trouble standing or walking. Sit down right away if you feel dizzy. Avoid doing any tasks or activities that can cause danger to you or others if you get dizzy. Avoid bending down if you feel dizzy. Place items in your home so that they are easy for you to reach without bending or leaning over. Do not drive or use machinery if you feel dizzy. General instructions Take over-the-counter and prescription medicines only as told by your doctor. Keep all follow-up visits. Contact a doctor if: Your medicine does not help your vertigo. Your problems get worse or you have new symptoms. You have a fever. You feel like you may vomit (nauseous), or this feeling gets worse. You start to vomit. Your family or friends see changes in how you act. You lose feeling (have numbness) in part of your body. You feel prickling and tingling in a part of your body. Get help right away if: You are always dizzy. You faint. You get very bad headaches. You get a stiff neck. Bright light starts to bother  you. You have trouble moving or talking. You feel weak in your hands, arms, or legs. You have changes in your hearing or in how you see (vision). These symptoms may be an emergency. Get help right away. Call your local emergency services (911 in the [place] Do not wait to see if the symptoms will go away. Do not drive yourself to the hospital. Summary Vertigo is the feeling that you or the things around you are moving when they are not. Your doctor will do tests to find the cause of your vertigo. You may be told to avoid some tasks, positions, or movements. Contact a doctor if your medicine is not helping, or if you have a fever, new symptoms, or a change in how you act. Get help right away if you get very bad headaches, or if you have changes in how you speak, hear, or see. This information is not intended to replace advice given to you by your health care provider. Make sure you discuss any questions you have with your healthcare provider. Document Revised: 05/08/2020 Document Reviewed: 05/08/2020 Elsevier Patient Education  2022 [employer] Inc.  

## 2021-03-25 NOTE — ED Notes (Signed)
Pt resting in bed. Denies needs. Lights dimmed. Call light within reach.

## 2021-03-25 NOTE — ED Notes (Addendum)
Pt d/c'd via wheelchair. Still slightly unsteady on feet due to room spinning. Husband drove pt home. Pt AxO x4.

## 2021-03-26 ENCOUNTER — Telehealth: Payer: Self-pay

## 2021-03-26 NOTE — Telephone Encounter (Signed)
I left the pt a message that Laurance Flatten, DNP, FNP-BC wanted to check and see how the pt is feeling.

## 2021-03-31 DIAGNOSIS — H5213 Myopia, bilateral: Secondary | ICD-10-CM | POA: Diagnosis not present

## 2021-04-01 DIAGNOSIS — M9901 Segmental and somatic dysfunction of cervical region: Secondary | ICD-10-CM | POA: Diagnosis not present

## 2021-04-01 DIAGNOSIS — M9904 Segmental and somatic dysfunction of sacral region: Secondary | ICD-10-CM | POA: Diagnosis not present

## 2021-04-01 DIAGNOSIS — M9903 Segmental and somatic dysfunction of lumbar region: Secondary | ICD-10-CM | POA: Diagnosis not present

## 2021-04-01 DIAGNOSIS — M9902 Segmental and somatic dysfunction of thoracic region: Secondary | ICD-10-CM | POA: Diagnosis not present

## 2021-04-07 DIAGNOSIS — F330 Major depressive disorder, recurrent, mild: Secondary | ICD-10-CM | POA: Diagnosis not present

## 2021-04-07 DIAGNOSIS — R69 Illness, unspecified: Secondary | ICD-10-CM | POA: Diagnosis not present

## 2021-04-08 DIAGNOSIS — M9901 Segmental and somatic dysfunction of cervical region: Secondary | ICD-10-CM | POA: Diagnosis not present

## 2021-04-08 DIAGNOSIS — M9903 Segmental and somatic dysfunction of lumbar region: Secondary | ICD-10-CM | POA: Diagnosis not present

## 2021-04-08 DIAGNOSIS — M9902 Segmental and somatic dysfunction of thoracic region: Secondary | ICD-10-CM | POA: Diagnosis not present

## 2021-04-08 DIAGNOSIS — M9904 Segmental and somatic dysfunction of sacral region: Secondary | ICD-10-CM | POA: Diagnosis not present

## 2021-04-15 DIAGNOSIS — M9903 Segmental and somatic dysfunction of lumbar region: Secondary | ICD-10-CM | POA: Diagnosis not present

## 2021-04-15 DIAGNOSIS — M9901 Segmental and somatic dysfunction of cervical region: Secondary | ICD-10-CM | POA: Diagnosis not present

## 2021-04-15 DIAGNOSIS — M9904 Segmental and somatic dysfunction of sacral region: Secondary | ICD-10-CM | POA: Diagnosis not present

## 2021-04-15 DIAGNOSIS — M9902 Segmental and somatic dysfunction of thoracic region: Secondary | ICD-10-CM | POA: Diagnosis not present

## 2021-04-28 DIAGNOSIS — R69 Illness, unspecified: Secondary | ICD-10-CM | POA: Diagnosis not present

## 2021-04-28 DIAGNOSIS — F330 Major depressive disorder, recurrent, mild: Secondary | ICD-10-CM | POA: Diagnosis not present

## 2021-04-29 DIAGNOSIS — M9902 Segmental and somatic dysfunction of thoracic region: Secondary | ICD-10-CM | POA: Diagnosis not present

## 2021-04-29 DIAGNOSIS — M9904 Segmental and somatic dysfunction of sacral region: Secondary | ICD-10-CM | POA: Diagnosis not present

## 2021-04-29 DIAGNOSIS — M9903 Segmental and somatic dysfunction of lumbar region: Secondary | ICD-10-CM | POA: Diagnosis not present

## 2021-04-29 DIAGNOSIS — M9901 Segmental and somatic dysfunction of cervical region: Secondary | ICD-10-CM | POA: Diagnosis not present

## 2021-05-12 ENCOUNTER — Other Ambulatory Visit: Payer: Self-pay

## 2021-05-12 ENCOUNTER — Encounter (HOSPITAL_BASED_OUTPATIENT_CLINIC_OR_DEPARTMENT_OTHER): Payer: Self-pay

## 2021-05-12 ENCOUNTER — Emergency Department (HOSPITAL_BASED_OUTPATIENT_CLINIC_OR_DEPARTMENT_OTHER)
Admission: EM | Admit: 2021-05-12 | Discharge: 2021-05-12 | Disposition: A | Discharge status: Home / Self Care | Payer: Medicare HMO | Attending: Emergency Medicine | Admitting: Emergency Medicine

## 2021-05-12 ENCOUNTER — Emergency Department (HOSPITAL_BASED_OUTPATIENT_CLINIC_OR_DEPARTMENT_OTHER): Payer: Medicare HMO

## 2021-05-12 DIAGNOSIS — Z79899 Other long term (current) drug therapy: Secondary | ICD-10-CM | POA: Diagnosis not present

## 2021-05-12 DIAGNOSIS — Z7982 Long term (current) use of aspirin: Secondary | ICD-10-CM | POA: Diagnosis not present

## 2021-05-12 DIAGNOSIS — E039 Hypothyroidism, unspecified: Secondary | ICD-10-CM | POA: Diagnosis not present

## 2021-05-12 DIAGNOSIS — Z87891 Personal history of nicotine dependence: Secondary | ICD-10-CM | POA: Diagnosis not present

## 2021-05-12 DIAGNOSIS — Z20822 Contact with and (suspected) exposure to covid-19: Secondary | ICD-10-CM | POA: Insufficient documentation

## 2021-05-12 DIAGNOSIS — J189 Pneumonia, unspecified organism: Secondary | ICD-10-CM | POA: Insufficient documentation

## 2021-05-12 DIAGNOSIS — R059 Cough, unspecified: Secondary | ICD-10-CM | POA: Diagnosis not present

## 2021-05-12 DIAGNOSIS — R0602 Shortness of breath: Secondary | ICD-10-CM | POA: Diagnosis not present

## 2021-05-12 LAB — CBC WITH DIFFERENTIAL/PLATELET
Abs Immature Granulocytes: 0.04 10*3/uL (ref 0.00–0.07)
Basophils Absolute: 0.1 10*3/uL (ref 0.0–0.1)
Basophils Relative: 1 %
Eosinophils Absolute: 0.1 10*3/uL (ref 0.0–0.5)
Eosinophils Relative: 1 %
HCT: 35.4 % — ABNORMAL LOW (ref 36.0–46.0)
Hemoglobin: 11.9 g/dL — ABNORMAL LOW (ref 12.0–15.0)
Immature Granulocytes: 0 %
Lymphocytes Relative: 16 %
Lymphs Abs: 1.6 10*3/uL (ref 0.7–4.0)
MCH: 28.8 pg (ref 26.0–34.0)
MCHC: 33.6 g/dL (ref 30.0–36.0)
MCV: 85.7 fL (ref 80.0–100.0)
Monocytes Absolute: 0.6 10*3/uL (ref 0.1–1.0)
Monocytes Relative: 6 %
Neutro Abs: 7.7 10*3/uL (ref 1.7–7.7)
Neutrophils Relative %: 76 %
Platelets: 494 10*3/uL — ABNORMAL HIGH (ref 150–400)
RBC: 4.13 MIL/uL (ref 3.87–5.11)
RDW: 14.6 % (ref 11.5–15.5)
WBC: 10 10*3/uL (ref 4.0–10.5)
nRBC: 0 % (ref 0.0–0.2)

## 2021-05-12 LAB — BASIC METABOLIC PANEL
Anion gap: 7 (ref 5–15)
BUN: 8 mg/dL (ref 8–23)
CO2: 27 mmol/L (ref 22–32)
Calcium: 9.2 mg/dL (ref 8.9–10.3)
Chloride: 104 mmol/L (ref 98–111)
Creatinine, Ser: 0.86 mg/dL (ref 0.44–1.00)
GFR, Estimated: >60 mL/min (ref >60)
Glucose, Bld: 96 mg/dL (ref 70–99)
Potassium: 3.5 mmol/L (ref 3.5–5.1)
Sodium: 138 mmol/L (ref 135–145)

## 2021-05-12 LAB — RESP PANEL BY RT-PCR (FLU A&B, COVID) ARPGX2
Influenza A by PCR: NEGATIVE
Influenza B by PCR: NEGATIVE
SARS Coronavirus 2 by RT PCR: NEGATIVE

## 2021-05-12 MED ORDER — DOXYCYCLINE HYCLATE 100 MG PO CAPS: 100.0000 mg | ORAL_CAPSULE | Freq: Two times a day (BID) | ORAL | 0 refills | Status: AC

## 2021-05-12 MED ORDER — DOXYCYCLINE HYCLATE 100 MG PO TABS
100.0000 mg | ORAL_TABLET | Freq: Once | ORAL | Status: AC
  Administered 2021-05-12: 100 mg via ORAL
  Filled 2021-05-12: qty 1

## 2021-05-12 NOTE — ED Triage Notes (Addendum)
Pt states has had a cough, hoarse voice, sore throat, diarrhea, headache, fever, chills since Thursday. C/o shortness of breath and chest tightness. NAD during triage.

## 2021-05-12 NOTE — ED Provider Notes (Signed)
[facility] EMERGENCY DEPARTMENT Provider Note   CSN: [id number] Arrival date & time: 05/12/21  3875     History Chief Complaint  Patient presents with   URI    Maria Hall is a 70 y.o. female with history of thyroid disease who presents to the emergency department today with a 5-day history of URI symptoms.  Patient complains of productive cough with yellow/green sputum, sharp diffuse chest pain, dyspnea on exertion, general malaise, fatigue, myalgias, and diarrhea.  She has been trying home remedies including chicken broth and tea with lemon which have offered little improvement.  Chest pain is worse with coughing.  She also reports associated headache.  She denies nausea, vomiting, abdominal pain, leg pain, leg swelling.  She rates her cough moderate in severity. Also complains of sore throat.    URI     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   Bronchitis    gets every year in the winter   Insomnia    takes meds   Right rotator cuff tear    Swelling    in hands and ankles   Thyroid disease     Patient Active Problem List   Diagnosis Date Noted   Essential hypertension, benign 06/21/2019   Other abnormal glucose 06/21/2019   Primary hypothyroidism 06/21/2019   Sickle-cell trait ([place]) 11/24/2016    Past Surgical History:  Procedure Laterality Date   ABDOMINAL HYSTERECTOMY     BUNIONECTOMY     bil feet   CESAREAN SECTION     CESAREAN SECTION     1 time   CHOLECYSTECTOMY     COLONOSCOPY     tummy tuck       OB History   No obstetric history on file.     Family History  Adopted: Yes    Social History   Tobacco Use   Smoking status: Former    Packs/day: 1.00    Years: 20.00    Pack years: 20.00    Types: Cigarettes    Quit date: 1990    Years since quitting: 32.9   Smokeless tobacco: Never  Vaping Use   Vaping Use: Never used  Substance Use Topics   Alcohol use: Not Currently   Drug use:  No    Home Medications Prior to Admission medications   Medication Sig Start Date End Date Taking? Authorizing Provider  doxycycline (VIBRAMYCIN) 100 MG capsule Take 1 capsule (100 mg total) by mouth 2 (two) times daily for 7 days. 05/12/21 05/19/21 Yes Trifan, Carola Rhine, MD  acetaminophen (TYLENOL) 650 MG CR tablet Take 650 mg by mouth. Take 2 caplets daily    [provider]  Ascorbic Acid (VITAMIN C) 1000 MG tablet Take 1,000 mg by mouth daily.    [provider]  aspirin EC 81 MG tablet Take 81 mg by mouth daily.    [provider]  cholecalciferol (VITAMIN D) 1000 units tablet Take 1,000 Units by mouth daily.    [provider]  Docusate Calcium (STOOL SOFTENER PO) Take 100 mg by mouth. Take 2 soft gels daily    [provider]  folic acid (FOLVITE) 1 MG tablet TAKE 1 TABLET BY MOUTH EVERY DAY 02/17/21   [place] Chard, MD  hydrochlorothiazide (HYDRODIURIL) 12.5 MG tablet TAKE 1 TABLET BY MOUTH EVERY DAY 09/23/20   Minette Brine, FNP  HYDROcodone-acetaminophen (NORCO/VICODIN) 5-325 MG tablet Take 1 tablet by mouth every 6 (six)  hours as needed for moderate pain. 12/20/19   [place] Chard, MD  levothyroxine (SYNTHROID) 88 MCG tablet TAKE 1 TABLET (88 MCG TOTAL) BY MOUTH DAILY BEFORE BREAKFAST. MON - SAT ONLY 02/17/21   Minette Brine, FNP  meclizine (ANTIVERT) 12.5 MG tablet Take 1 tablet (12.5 mg total) by mouth 3 (three) times daily as needed for dizziness. 03/25/21   Minette Brine, FNP  Omega 3-6-9 Fatty Acids (OMEGA 3-6-9 COMPLEX PO) Take 1,600 mg by mouth daily at 6 (six) AM.    [provider]  ondansetron (ZOFRAN) 4 MG tablet Take 1 tablet (4 mg total) by mouth daily as needed for nausea or vomiting. 03/25/21 03/25/22  Minette Brine, FNP  vitamin E 400 UNIT capsule Take 400 Units by mouth daily.    [provider]    Allergies    Patient has no known allergies.  Review of Systems   Review of Systems  All other systems  reviewed and are negative.  Physical Exam Updated Vital Signs BP 114/66   Pulse 67   Temp 98.1 F (36.7 C) (Oral)   Resp 14   Ht 5\' 7"  (1.702 m)   Wt 72.6 kg   SpO2 95%   BMI 25.06 kg/m   Physical Exam Vitals and nursing note reviewed.  Constitutional:      General: She is not in acute distress.    Appearance: Normal appearance.  HENT:     Head: Normocephalic and atraumatic.     Mouth/Throat:     Comments: No uvular edema or erythema.  Uvula is midline.  No pharyngeal edema.  There is mild posterior pharyngeal erythema.  No tonsillar exudate, swelling, or significant erythema.  Talking in complete sentences. Eyes:     General:        Right eye: No discharge.        Left eye: No discharge.  Cardiovascular:     Comments: Regular rate and rhythm.  S1/S2 are distinct without any evidence of murmur, rubs, or gallops.  Radial pulses are 2+ bilaterally.  Dorsalis pedis pulses are 2+ bilaterally.  No evidence of pedal edema. Pulmonary:     Comments: Clear to auscultation bilaterally.  Normal effort.  No respiratory distress.  No evidence of wheezes, rales, or rhonchi heard throughout. Abdominal:     General: Abdomen is flat. Bowel sounds are normal. There is no distension.     Tenderness: There is no abdominal tenderness. There is no guarding or rebound.  Musculoskeletal:        General: Normal range of motion.     Cervical back: Neck supple.  Skin:    General: Skin is warm and dry.     Findings: No rash.  Neurological:     General: No focal deficit present.     Mental Status: She is alert.  Psychiatric:        Mood and Affect: Mood normal.        Behavior: Behavior normal.    ED Results / Procedures / Treatments   Labs (all labs ordered are listed, but only abnormal results are displayed) Labs Reviewed  CBC WITH DIFFERENTIAL/PLATELET - Abnormal; Notable for the following components:      Result Value   Hemoglobin 11.9 (*)    HCT 35.4 (*)    Platelets 494 (*)    All  other components within normal limits  RESP PANEL BY RT-PCR (FLU A&B, COVID) ARPGX2  BASIC METABOLIC PANEL    EKG EKG Interpretation  Date/Time:  Monday May 12 2021 10:08:27 EST Ventricular Rate:  74 PR Interval:  169 QRS Duration: 87 QT Interval:  374 QTC Calculation: 415 R Axis:   54 Text Interpretation: Sinus rhythm Ventricular premature complex Confirmed by Octaviano Glow [phone]) on 05/12/2021 10:23:40 AM  Radiology DG Chest Portable 1 View  Result Date: 05/12/2021 CLINICAL DATA:  Cough and shortness of breath EXAM: PORTABLE CHEST 1 VIEW COMPARISON:  Chest x-ray dated December 22, 2017 FINDINGS: The heart size and mediastinal contours are within normal limits. New right upper lobe opacity. No large pleural effusion or evidence of pneumothorax. The visualized skeletal structures are unremarkable. IMPRESSION: New right upper lobe opacity, concerning for infection. Recommend follow-up PA and lateral chest x-ray to ensure resolution. Electronically Signed   By: Yetta Glassman M.D.   On: 05/12/2021 10:47    Procedures Procedures   Medications Ordered in ED Medications  doxycycline (VIBRA-TABS) tablet 100 mg (has no administration in time range)    ED Course  I have reviewed the triage vital signs and the nursing notes.  Pertinent labs & imaging results that were available during my care of the patient were reviewed by me and considered in my medical decision making (see chart for details).  Clinical Course as of 05/12/21 1253  Mon May 12, 2021  1228 I discussed this case with my attending physician who cosigned this note including patient's presenting symptoms, physical exam, and planned diagnostics and interventions. Attending physician stated agreement with plan or made changes to plan which were implemented.   Attending physician assessed patient at bedside.   [CF]    Clinical Course User Index [CF] Cherrie Gauze   MDM Rules/Calculators/A&P                           NASTASSJA WITKOP is a 70 y.o. female who is otherwise healthy who presents the emergency department for evaluation of flulike symptoms.  She is no acute distress at this time.  I suspect this is a viral illness like the flu.  She tested negative for COVID at home.  Given her age, I will get a chest x-ray to evaluate for overlying pneumonia.  Low suspicion for ACS at this time.  CBC without any significant leukocytosis.  Mild anemia.  BMP was normal.  COVID and influenza negative.  Chest x-ray showed consolidation and given the clinical scenario I believe this is likely pneumonia.    She is otherwise healthy with no significant comorbidities.  No recent hospitalizations or antibiotics in the last 90 days.  I will treat empirically likely with doxycycline as a community-acquired pneumonia.  Strict turn precautions are given.  She is safe for discharge.  Final Clinical Impression(s) / ED Diagnoses Final diagnoses:  Community acquired pneumonia of right upper lobe of lung    Rx / DC Orders ED Discharge Orders          Ordered    doxycycline (VIBRAMYCIN) 100 MG capsule  2 times daily        05/12/21 1236             Myna Bright Gem, [place] 05/12/21 1255    Wyvonnia Dusky, MD 05/12/21 [phone]

## 2021-05-12 NOTE — Discharge Instructions (Addendum)
Your influenza and COVID test were negative.  Please follow-up with your doctor in 1 to 2 weeks for repeat x-rays of your chest to see if this possible pneumonia on the right upper lobe is improved

## 2021-05-13 ENCOUNTER — Telehealth: Payer: Self-pay

## 2021-05-19 DIAGNOSIS — R69 Illness, unspecified: Secondary | ICD-10-CM | POA: Diagnosis not present

## 2021-05-19 DIAGNOSIS — F330 Major depressive disorder, recurrent, mild: Secondary | ICD-10-CM | POA: Diagnosis not present

## 2021-05-19 NOTE — Telephone Encounter (Signed)
error 

## 2021-05-21 ENCOUNTER — Other Ambulatory Visit: Payer: Self-pay

## 2021-05-21 ENCOUNTER — Encounter: Payer: Self-pay | Admitting: Nurse Practitioner

## 2021-05-21 ENCOUNTER — Ambulatory Visit (INDEPENDENT_AMBULATORY_CARE_PROVIDER_SITE_OTHER): Payer: Medicare HMO | Admitting: Nurse Practitioner

## 2021-05-21 VITALS — BP 110/70 | HR 67 | Temp 98.3°F | Ht 63.8 in | Wt 160.6 lb

## 2021-05-21 DIAGNOSIS — J189 Pneumonia, unspecified organism: Secondary | ICD-10-CM | POA: Diagnosis not present

## 2021-05-21 DIAGNOSIS — M48062 Spinal stenosis, lumbar region with neurogenic claudication: Secondary | ICD-10-CM | POA: Diagnosis not present

## 2021-05-21 DIAGNOSIS — R051 Acute cough: Secondary | ICD-10-CM

## 2021-05-21 MED ORDER — BENZONATATE 100 MG PO CAPS
100.0000 mg | ORAL_CAPSULE | Freq: Three times a day (TID) | ORAL | 0 refills | Status: DC | PRN
Start: 2021-05-21 — End: 2021-11-10

## 2021-05-21 NOTE — Progress Notes (Signed)
I,[name],acting as a Education administrator for [employer], NP.,have documented all relevant documentation on the behalf of [employer], NP,as directed by  Bary Castilla, NP while in the presence of Bary Castilla, NP.  This visit occurred during the SARS-CoV-2 public health emergency.  Safety protocols were in place, including screening questions prior to the visit, additional usage of staff PPE, and extensive cleaning of exam room while observing appropriate contact time as indicated for disinfecting solutions.  Subjective:     Patient ID: Maria Hall , female    DOB: 11/28/1950 , 70 y.o.   MRN: [id number]   Chief Complaint  Patient presents with   ER F/U    HPI  Patient presents today for a ER F/U. She was diagnosed with penumonia. She reports she is feeling much better than she was last week. She does reports she has been coughing a little more today than yesterday.     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 Take 2 caplets daily, Disp: , Rfl:    Ascorbic Acid (VITAMIN C) 1000 MG tablet, Take 1,000 mg by mouth daily., Disp: , Rfl:    aspirin EC 81 MG tablet, Take 81 mg by mouth daily., Disp: , Rfl:    benzonatate (TESSALON PERLES) 100 MG capsule, Take 1 capsule (100 mg total) by mouth 3 (three) times daily as needed for cough., Disp: 30 capsule, Rfl: 0   cholecalciferol (VITAMIN D) 1000 units tablet, Take 1,000 Units by mouth daily., Disp: , Rfl:    Docusate Calcium (STOOL SOFTENER PO), Take 100 mg by mouth. Take 2 soft gels daily, Disp: , Rfl:    folic acid (FOLVITE) 1 MG tablet, TAKE 1 TABLET BY MOUTH EVERY DAY, Disp: 90  tablet, Rfl: 1   hydrochlorothiazide (HYDRODIURIL) 12.5 MG tablet, TAKE 1 TABLET BY MOUTH EVERY DAY, Disp: 90 tablet, Rfl: 1   HYDROcodone-acetaminophen (NORCO/VICODIN) 5-325 MG tablet, Take 1 tablet by mouth every 6 (six) hours as needed for moderate pain., Disp: 30 tablet, Rfl: 0   levothyroxine (SYNTHROID) 88 MCG tablet, TAKE 1 TABLET (88 MCG TOTAL) BY MOUTH DAILY BEFORE BREAKFAST. MON - SAT ONLY, Disp: 78 tablet, Rfl: 1   meclizine (ANTIVERT) 12.5 MG tablet, Take 1 tablet (12.5 mg total) by mouth 3 (three) times daily as needed for dizziness., Disp: 30 tablet, Rfl: 0   Omega 3-6-9 Fatty Acids (OMEGA 3-6-9 COMPLEX PO), Take 1,600 mg by mouth daily at 6 (six) AM., Disp: , Rfl:    ondansetron (ZOFRAN) 4 MG tablet, Take 1 tablet (4 mg total) by mouth daily as needed for nausea or vomiting., Disp: 30 tablet, Rfl: 1   vitamin E 400 UNIT capsule, Take 400 Units by mouth daily., Disp: , Rfl:    No Known Allergies   Review of Systems  Constitutional:  Negative for chills and fever.  HENT:  Negative for congestion and rhinorrhea.   Respiratory:  Positive for cough. Negative for shortness of breath and wheezing.   Cardiovascular:  Negative for chest pain and  palpitations.  Endocrine: Negative for polydipsia, polyphagia and polyuria.  Musculoskeletal:  Negative for arthralgias.  Neurological:  Negative for dizziness and headaches.    Today's Vitals   05/21/21 1139  BP: 110/70  Pulse: 67  Temp: 98.3 F (36.8 C)  Weight: 160 lb 9.6 oz (72.8 kg)  Height: 5' 3.8" (1.621 m)  PainSc: 0-No pain   Body mass index is 27.74 kg/m.  Wt Readings from Last 3 Encounters:  05/21/21 160 lb 9.6 oz (72.8 kg)  05/12/21 160 lb (72.6 kg)  03/25/21 155 lb (70.3 kg)     Objective:  Physical Exam Constitutional:      Appearance: Normal appearance.  HENT:     Head: Normocephalic and atraumatic.  Cardiovascular:     Rate and Rhythm: Normal rate and regular rhythm.     Pulses: Normal pulses.     Heart  sounds: Normal heart sounds. No murmur heard. Pulmonary:     Effort: Pulmonary effort is normal. No respiratory distress.     Breath sounds: Normal breath sounds. No wheezing or rhonchi.  Neurological:     Mental Status: She is alert.        Assessment And Plan:     1. Community acquired pneumonia of right upper lobe of lung -She was recently hospitalized for pneumonia, Given antx she finishes it today. She is feeling a lot better. No complaints of chest pain or shortness of breath.   2. Acute cough -Still has some cough from the pneumonia. Advised pt.to take OTC delsym or cough syrup. Advised pt. To let us know if the cough gets worse or does not get better.  - benzonatate (TESSALON PERLES) 100 MG capsule; Take 1 capsule (100 mg total) by mouth 3 (three) times daily as needed for cough.  Dispense: 30 capsule; Refill: 0   The patient was encouraged to call or send a message through [place] for any questions or concerns.   Follow up: if symptoms persist or do not get better.   Side effects and appropriate use of all the medication(s) were discussed with the patient today. Patient advised to use the medication(s) as directed by their healthcare provider. The patient was encouraged to read, review, and understand all associated package inserts and contact our office with any questions or concerns. The patient accepts the risks of the treatment plan and had an opportunity to ask questions.   Staying healthy and adopting a healthy lifestyle for your overall health is important. You should eat 7 or more servings of fruits and vegetables per day. You should drink plenty of water to keep yourself hydrated and your kidneys healthy. This includes about 65-80+ fluid ounces of water. Limit your intake of animal fats especially for elevated cholesterol. Avoid highly processed food and limit your salt intake if you have hypertension. Avoid foods high in saturated/Trans fats. Along with a healthy diet it is  also very important to maintain time for yourself to maintain a healthy mental health with low stress levels. You should get atleast 150 min of moderate intensity exercise weekly for a healthy heart. Along with eating right and exercising, aim for at least 7-9 hours of sleep daily.  Eat more whole grains which includes barley, wheat berries, oats, brown rice and whole wheat pasta. Use healthy plant oils which include olive, soy, corn, sunflower and peanut. Limit your caffeine and sugary drinks. Limit your intake of fast foods. Limit milk and dairy products to one or two daily servings.   Patient  was given opportunity to ask questions. Patient verbalized understanding of the plan and was able to repeat key elements of the plan. All questions were answered to their satisfaction.  Raman Tehya Leath, DNP   I, Raman Tevis Conger have reviewed all documentation for this visit. The documentation on 05/21/21 for the exam, diagnosis, procedures, and orders are all /accurate and complete.   IF YOU HAVE BEEN REFERRED TO A SPECIALIST, IT MAY TAKE 1-2 WEEKS TO SCHEDULE/PROCESS THE REFERRAL. IF YOU HAVE NOT HEARD FROM US/SPECIALIST IN TWO WEEKS, PLEASE GIVE [place] A CALL AT [phone].   THE PATIENT IS ENCOURAGED TO PRACTICE SOCIAL DISTANCING DUE TO THE COVID-19 PANDEMIC.   /

## 2021-05-27 DIAGNOSIS — M9904 Segmental and somatic dysfunction of sacral region: Secondary | ICD-10-CM | POA: Diagnosis not present

## 2021-05-27 DIAGNOSIS — M9903 Segmental and somatic dysfunction of lumbar region: Secondary | ICD-10-CM | POA: Diagnosis not present

## 2021-05-27 DIAGNOSIS — M9901 Segmental and somatic dysfunction of cervical region: Secondary | ICD-10-CM | POA: Diagnosis not present

## 2021-05-27 DIAGNOSIS — M9902 Segmental and somatic dysfunction of thoracic region: Secondary | ICD-10-CM | POA: Diagnosis not present

## 2021-06-02 DIAGNOSIS — R69 Illness, unspecified: Secondary | ICD-10-CM | POA: Diagnosis not present

## 2021-06-02 DIAGNOSIS — F330 Major depressive disorder, recurrent, mild: Secondary | ICD-10-CM | POA: Diagnosis not present

## 2021-06-25 ENCOUNTER — Ambulatory Visit (INDEPENDENT_AMBULATORY_CARE_PROVIDER_SITE_OTHER): Payer: Medicare HMO

## 2021-06-25 VITALS — Ht 66.0 in | Wt 160.0 lb

## 2021-06-25 DIAGNOSIS — Z0000 Encounter for general adult medical examination without abnormal findings: Secondary | ICD-10-CM | POA: Diagnosis not present

## 2021-06-25 NOTE — Progress Notes (Signed)
I connected with  Nelida Meuse today via telehealth video enabled device and verified that I am speaking with the correct person using two identifiers.   Location: Patient: home Provider: work  Persons participating in virtual visit: Emree Locicero, Glenna Durand LPN  I discussed the limitations, risks, security and privacy concerns of performing an evaluation and management service by video and the availability of in person appointments. The patient expressed understanding and agreed to proceed.   Some vital signs may be absent or patient reported.     Subjective:   Maria Hall is a 71 y.o. female who presents for Medicare Annual (Subsequent) preventive examination.  Review of Systems     Cardiac Risk Factors include: advanced age (>45men, >63 women);hypertension     Objective:    Today's Vitals   06/25/21 1558  Weight: 160 lb (72.6 kg)  Height: 5\' 6"  (1.676 m)   Body mass index is 25.82 kg/m.  Advanced Directives 06/25/2021 05/12/2021 03/24/2021 07/04/2020 06/21/2019 06/09/2018 11/24/2016  Does Patient Have a Medical Advance Directive? Yes Yes No Yes Yes Yes Yes  Type of Paramedic of [place];Living will Living will;Healthcare Power of [place];Living will [place];Living will Living will;Healthcare Power of [place]  Does patient want to make changes to medical advance directive? - - - - - No - Patient declined -  Copy of [place] in Chart? No - copy requested No - copy requested - No - copy requested No - copy requested No - copy requested -  Would patient like information on creating a medical advance directive? - - No - Patient declined - - - -    Current Medications (verified) Outpatient Encounter Medications as of 06/25/2021  Medication Sig   acetaminophen (TYLENOL) 650 MG CR tablet Take 650 mg by mouth. Take 2 caplets daily    Ascorbic Acid (VITAMIN C) 1000 MG tablet Take 1,000 mg by mouth daily.   aspirin EC 81 MG tablet Take 81 mg by mouth daily.   cholecalciferol (VITAMIN D) 1000 units tablet Take 1,000 Units by mouth daily.   Docusate Calcium (STOOL SOFTENER PO) Take 100 mg by mouth. Take 2 soft gels daily   folic acid (FOLVITE) 1 MG tablet TAKE 1 TABLET BY MOUTH EVERY DAY   hydrochlorothiazide (HYDRODIURIL) 12.5 MG tablet TAKE 1 TABLET BY MOUTH EVERY DAY   HYDROcodone-acetaminophen (NORCO/VICODIN) 5-325 MG tablet Take 1 tablet by mouth every 6 (six) hours as needed for moderate pain.   levothyroxine (SYNTHROID) 88 MCG tablet TAKE 1 TABLET (88 MCG TOTAL) BY MOUTH DAILY BEFORE BREAKFAST. MON - SAT ONLY   meclizine (ANTIVERT) 12.5 MG tablet Take 1 tablet (12.5 mg total) by mouth 3 (three) times daily as needed for dizziness.   Omega 3-6-9 Fatty Acids (OMEGA 3-6-9 COMPLEX PO) Take 1,600 mg by mouth daily at 6 (six) AM.   vitamin E 400 UNIT capsule Take 400 Units by mouth daily.   benzonatate (TESSALON PERLES) 100 MG capsule Take 1 capsule (100 mg total) by mouth 3 (three) times daily as needed for cough. (Patient not taking: Reported on 06/25/2021)   ondansetron (ZOFRAN) 4 MG tablet Take 1 tablet (4 mg total) by mouth daily as needed for nausea or vomiting. (Patient not taking: Reported on 06/25/2021)   No facility-administered encounter medications on file as of 06/25/2021.    Allergies (verified) Patient has no known allergies.   History: Past Medical  History:  Diagnosis Date   Arthritis    in shoulders and hips   Bronchitis    gets every year in the winter   Insomnia    takes meds   Right rotator cuff tear    Swelling    in hands and ankles   Thyroid disease    Past Surgical History:  Procedure Laterality Date   ABDOMINAL HYSTERECTOMY     BUNIONECTOMY     bil feet   CESAREAN SECTION     CESAREAN SECTION     1 time   CHOLECYSTECTOMY     COLONOSCOPY     tummy tuck     Family History  Adopted: Yes    Social History   Socioeconomic History   Marital status: Married    Spouse name: Not on file   Number of children: Not on file   Years of education: Not on file   Highest education level: Not on file  Occupational History   Occupation: retired  Tobacco Use   Smoking status: Former    Packs/day: 1.00    Years: 20.00    Pack years: 20.00    Types: Cigarettes    Quit date: 1990    Years since quitting: 33.0   Smokeless tobacco: Never  Vaping Use   Vaping Use: Never used  Substance and Sexual Activity   Alcohol use: Not Currently   Drug use: No   Sexual activity: Yes  Other Topics Concern   Not on file  Social History Narrative   Not on file   Social Determinants of Health   Financial Resource Strain: Low Risk    Difficulty of Paying Living Expenses: Not hard at all  Food Insecurity: No Food Insecurity   Worried About Charity fundraiser in the Last Year: Never true   [place] in the Last Year: Never true  Transportation Needs: No Transportation Needs   Lack of Transportation (Medical): No   Lack of Transportation (Non-Medical): No  Physical Activity: Sufficiently Active   Days of Exercise per Week: 3 days   Minutes of Exercise per Session: 60 min  Stress: No Stress Concern Present   Feeling of Stress : Not at all  Social Connections: Not on file    Tobacco Counseling Counseling given: Not Answered   Clinical Intake:  Pre-visit preparation completed: Yes  Pain : No/denies pain     Nutritional Status: BMI 25 -29 Overweight Nutritional Risks: None Diabetes: No  How often do you need to have someone help you when you read instructions, pamphlets, or other written materials from your doctor or pharmacy?: 1 - Never What is the last grade level you completed in school?: college  Diabetic? no  Interpreter Needed?: No  Information entered by :: NAllen LPN   Activities of Daily Living In your present state of health, do you have any difficulty  performing the following activities: 06/25/2021 07/04/2020  Hearing? N Y  Comment - sometimes can not understand what is being said  Vision? N N  Difficulty concentrating or making decisions? Tempie Donning  Walking or climbing stairs? N N  Dressing or bathing? N N  Doing errands, shopping? N N  Preparing Food and eating ? N N  Using the Toilet? N N  In the past six months, have you accidently leaked urine? N N  Do you have problems with loss of bowel control? N N  Managing your Medications? N N  Managing your Finances? N N  Housekeeping or managing your Housekeeping? N N  Some recent data might be hidden    Patient Care Team: Glendale Chard, MD as PCP - General (Internal Medicine) Warden Fillers, MD as Consulting Physician (Ophthalmology)  Indicate any recent Medical Services you may have received from other than [facility] providers in the past year (date may be approximate).     Assessment:   This is a routine wellness examination for Maria Hall.  Hearing/Vision screen Vision Screening - Comments:: Regular eye exams, [facility]  Dietary issues and exercise activities discussed: Current Exercise Habits: Home exercise routine, Type of exercise: calisthenics;strength training/weights, Time (Minutes): 60, Frequency (Times/Week): 3, Weekly Exercise (Minutes/Week): 180   Goals Addressed             This Visit's Progress    Patient Stated       06/25/2021, eat healthier       Depression Screen PHQ 2/9 Scores 06/25/2021 07/04/2020 09/07/2019 06/21/2019 05/01/2019 03/28/2019 12/12/2018  PHQ - 2 Score 0 0 0 0 0 0 0  PHQ- 9 Score - - - 0 - - -    Fall Risk Fall Risk  06/25/2021 07/04/2020 09/07/2019 06/21/2019 05/01/2019  Falls in the past year? 1 1 0 0 0  Comment loses balance and trips over things tripped, fell out of bed - - -  Number falls in past yr: 1 1 - - -  Injury with Fall? 0 0 - - -  Risk for fall due to : Medication side effect Medication side effect - Medication side effect -   Follow up Falls evaluation completed;Education provided;Falls prevention discussed Falls evaluation completed;Education provided;Falls prevention discussed - Falls evaluation completed;Education provided;Falls prevention discussed -    FALL RISK PREVENTION PERTAINING TO THE HOME:  Any stairs in or around the home? Yes  If so, are there any without handrails? Yes  Home free of loose throw rugs in walkways, pet beds, electrical cords, etc? Yes  Adequate lighting in your home to reduce risk of falls? Yes   ASSISTIVE DEVICES UTILIZED TO PREVENT FALLS:  Life alert? No  Use of a cane, walker or w/c? No  Grab bars in the bathroom? No  Shower chair or bench in shower? No  Elevated toilet seat or a handicapped toilet? Yes   TIMED UP AND GO:  Was the test performed? No .      Cognitive Function:     6CIT Screen 06/25/2021 07/04/2020 06/21/2019 06/09/2018  What Year? 0 points 0 points 0 points 0 points  What month? 0 points 0 points 0 points 0 points  What time? 0 points 0 points 0 points 0 points  Count back from 20 0 points 0 points 0 points 0 points  Months in reverse 0 points 0 points 0 points 0 points  Repeat phrase 2 points 0 points 0 points 0 points  Total Score 2 0 0 0    Immunizations Immunization History  Administered Date(s) Administered   Influenza, High Dose Seasonal PF 04/08/2018, 03/01/2019   Influenza-Unspecified 04/11/2018, 03/03/2019, 04/02/2020, 04/12/2021   PFIZER(Purple Top)SARS-COV-2 Vaccination 07/28/2019, 08/18/2019, 03/18/2020, 10/01/2020, 03/31/2021   Pneumococcal Conjugate-13 03/10/2016   Pneumococcal Polysaccharide-23 07/12/2014   Tdap 01/22/2014   Zoster Recombinat (Shingrix) 04/04/2017, 07/27/2017   Zoster, Live 03/09/2016    TDAP status: Up to date  Flu Vaccine status: Up to date  Pneumococcal vaccine status: Up to date  Covid-19 vaccine status: Completed vaccines  Qualifies for Shingles Vaccine? Yes   Zostavax completed Yes  Shingrix  Completed?: Yes  Screening Tests Health Maintenance  Topic Date Due   COVID-19 Vaccine (6 - Booster for Pfizer series) 05/26/2021   COLONOSCOPY (Pts 45-66yrs Insurance coverage will need to be confirmed)  08/10/2022   MAMMOGRAM  10/03/2022   TETANUS/TDAP  01/23/2024   Pneumonia Vaccine 64+ Years old  Completed   INFLUENZA VACCINE  Completed   DEXA SCAN  Completed   Hepatitis C Screening  Completed   Zoster Vaccines- Shingrix  Completed   HPV VACCINES  Aged Out    Health Maintenance  Health Maintenance Due  Topic Date Due   COVID-19 Vaccine (6 - Booster for [place] series) 05/26/2021    Colorectal cancer screening: Type of screening: Colonoscopy. Completed 08/10/2017. Repeat every 10 years  Mammogram status: Completed 10/02/2020. Repeat every year  Bone Density status: Completed 05/26/2016.   Lung Cancer Screening: (Low Dose CT Chest recommended if Age 37-80 years, 30 pack-year currently smoking OR have quit w/in 15years.) does not qualify.   Lung Cancer Screening Referral: no  Additional Screening:  Hepatitis C Screening: does qualify; Completed 06/09/2018  Vision Screening: Recommended annual ophthalmology exams for early detection of glaucoma and other disorders of the eye. Is the patient up to date with their annual eye exam?  Yes  Who is the provider or what is the name of the office in which the patient attends annual eye exams? [facility] If pt is not established with a provider, would they like to be referred to a provider to establish care? No .   Dental Screening: Recommended annual dental exams for proper oral hygiene  Community Resource Referral / Chronic Care Management: CRR required this visit?  No   CCM required this visit?  No      Plan:     I have personally reviewed and noted the following in the patients chart:   Medical and social history Use of alcohol, tobacco or illicit drugs  Current medications and supplements including opioid  prescriptions.  Functional ability and status Nutritional status Physical activity Advanced directives List of other physicians Hospitalizations, surgeries, and ER visits in previous 12 months Vitals Screenings to include cognitive, depression, and falls Referrals and appointments  In addition, I have reviewed and discussed with patient certain preventive protocols, quality metrics, and best practice recommendations. A written personalized care plan for preventive services as well as general preventive health recommendations were provided to patient.     Kellie Simmering, LPN   10/25/2128   Nurse Notes: none

## 2021-06-25 NOTE — Patient Instructions (Signed)
Maria Hall , Thank you for taking time to come for your Medicare Wellness Visit. I appreciate your ongoing commitment to your health goals. Please review the following plan we discussed and let me know if I can assist you in the future.   Screening recommendations/referrals: Colonoscopy: completed 08/10/2017 Mammogram: completed 10/02/2020 Bone Density: completed 05/26/2016 Recommended yearly ophthalmology/optometry visit for glaucoma screening and checkup Recommended yearly dental visit for hygiene and checkup  Vaccinations: Influenza vaccine: completed 04/12/2021 Pneumococcal vaccine: completed 03/10/2016 Tdap vaccine: completed 01/22/2014, due 01/23/2024 Shingles vaccine: completed    Covid-19:03/31/2021, 10/01/2020, 03/18/2020, 08/18/2019, 07/28/2019  Advanced directives: Please bring a copy of your POA (Power of Attorney) and/or Living Will to your next appointment.   Conditions/risks identified: none  Next appointment: Follow up in one year for your annual wellness visit    Preventive Care 65 Years and Older, Female Preventive care refers to lifestyle choices and visits with your health care provider that can promote health and wellness. What does preventive care include? A yearly physical exam. This is also called an annual well check. Dental exams once or twice a year. Routine eye exams. Ask your health care provider how often you should have your eyes checked. Personal lifestyle choices, including: Daily care of your teeth and gums. Regular physical activity. Eating a healthy diet. Avoiding tobacco and drug use. Limiting alcohol use. Practicing safe sex. Taking low-dose aspirin every day. Taking vitamin and mineral supplements as recommended by your health care provider. What happens during an annual well check? The services and screenings done by your health care provider during your annual well check will depend on your age, overall health, lifestyle risk factors, and family  history of disease. Counseling  Your health care provider may ask you questions about your: Alcohol use. Tobacco use. Drug use. Emotional well-being. Home and relationship well-being. Sexual activity. Eating habits. History of falls. Memory and ability to understand (cognition). Work and work Statistician. Reproductive health. Screening  You may have the following tests or measurements: Height, weight, and BMI. Blood pressure. Lipid and cholesterol levels. These may be checked every 5 years, or more frequently if you are over 52 years old. Skin check. Lung cancer screening. You may have this screening every year starting at age 10 if you have a 30-pack-year history of smoking and currently smoke or have quit within the past 15 years. Fecal occult blood test (FOBT) of the stool. You may have this test every year starting at age 31. Flexible sigmoidoscopy or colonoscopy. You may have a sigmoidoscopy every 5 years or a colonoscopy every 10 years starting at age 108. Hepatitis C blood test. Hepatitis B blood test. Sexually transmitted disease (STD) testing. Diabetes screening. This is done by checking your blood sugar (glucose) after you have not eaten for a while (fasting). You may have this done every 1-3 years. Bone density scan. This is done to screen for osteoporosis. You may have this done starting at age 69. Mammogram. This may be done every 1-2 years. Talk to your health care provider about how often you should have regular mammograms. Talk with your health care provider about your test results, treatment options, and if necessary, the need for more tests. Vaccines  Your health care provider may recommend certain vaccines, such as: Influenza vaccine. This is recommended every year. Tetanus, diphtheria, and acellular pertussis (Tdap, Td) vaccine. You may need a Td booster every 10 years. Zoster vaccine. You may need this after age 109. Pneumococcal 13-valent conjugate (PCV13)  vaccine. One dose is recommended after age 80. Pneumococcal polysaccharide (PPSV23) vaccine. One dose is recommended after age 54. Talk to your health care provider about which screenings and vaccines you need and how often you need them. This information is not intended to replace advice given to you by your health care provider. Make sure you discuss any questions you have with your health care provider. Document Released: 07/05/2015 Document Revised: 02/26/2016 Document Reviewed: 04/09/2015 Elsevier Interactive Patient Education  2017 [place] Prevention in the Home Falls can cause injuries. They can happen to people of all ages. There are many things you can do to make your home safe and to help prevent falls. What can I do on the outside of my home? Regularly fix the edges of walkways and driveways and fix any cracks. Remove anything that might make you trip as you walk through a door, such as a raised step or threshold. Trim any bushes or trees on the path to your home. Use bright outdoor lighting. Clear any walking paths of anything that might make someone trip, such as rocks or tools. Regularly check to see if handrails are loose or broken. Make sure that both sides of any steps have handrails. Any raised decks and porches should have guardrails on the edges. Have any leaves, snow, or ice cleared regularly. Use sand or salt on walking paths during winter. Clean up any spills in your garage right away. This includes oil or grease spills. What can I do in the bathroom? Use night lights. Install grab bars by the toilet and in the tub and shower. Do not use towel bars as grab bars. Use non-skid mats or decals in the tub or shower. If you need to sit down in the shower, use a plastic, non-slip stool. Keep the floor dry. Clean up any water that spills on the floor as soon as it happens. Remove soap buildup in the tub or shower regularly. Attach bath mats securely with  double-sided non-slip rug tape. Do not have throw rugs and other things on the floor that can make you trip. What can I do in the bedroom? Use night lights. Make sure that you have a light by your bed that is easy to reach. Do not use any sheets or blankets that are too big for your bed. They should not hang down onto the floor. Have a firm chair that has side arms. You can use this for support while you get dressed. Do not have throw rugs and other things on the floor that can make you trip. What can I do in the kitchen? Clean up any spills right away. Avoid walking on wet floors. Keep items that you use a lot in easy-to-reach places. If you need to reach something above you, use a strong step stool that has a grab bar. Keep electrical cords out of the way. Do not use floor polish or wax that makes floors slippery. If you must use wax, use non-skid floor wax. Do not have throw rugs and other things on the floor that can make you trip. What can I do with my stairs? Do not leave any items on the stairs. Make sure that there are handrails on both sides of the stairs and use them. Fix handrails that are broken or loose. Make sure that handrails are as long as the stairways. Check any carpeting to make sure that it is firmly attached to the stairs. Fix any carpet that is loose or  worn. Avoid having throw rugs at the top or bottom of the stairs. If you do have throw rugs, attach them to the floor with carpet tape. Make sure that you have a light switch at the top of the stairs and the bottom of the stairs. If you do not have them, ask someone to add them for you. What else can I do to help prevent falls? Wear shoes that: Do not have high heels. Have rubber bottoms. Are comfortable and fit you well. Are closed at the toe. Do not wear sandals. If you use a stepladder: Make sure that it is fully opened. Do not climb a closed stepladder. Make sure that both sides of the stepladder are locked  into place. Ask someone to hold it for you, if possible. Clearly mark and make sure that you can see: Any grab bars or handrails. First and last steps. Where the edge of each step is. Use tools that help you move around (mobility aids) if they are needed. These include: Canes. Walkers. Scooters. Crutches. Turn on the lights when you go into a dark area. Replace any light bulbs as soon as they burn out. Set up your furniture so you have a clear path. Avoid moving your furniture around. If any of your floors are uneven, fix them. If there are any pets around you, be aware of where they are. Review your medicines with your doctor. Some medicines can make you feel dizzy. This can increase your chance of falling. Ask your doctor what other things that you can do to help prevent falls. This information is not intended to replace advice given to you by your health care provider. Make sure you discuss any questions you have with your health care provider. Document Released: 04/04/2009 Document Revised: 11/14/2015 Document Reviewed: 07/13/2014 Elsevier Interactive Patient Education  2017 [employer].

## 2021-06-30 DIAGNOSIS — R69 Illness, unspecified: Secondary | ICD-10-CM | POA: Diagnosis not present

## 2021-06-30 DIAGNOSIS — F331 Major depressive disorder, recurrent, moderate: Secondary | ICD-10-CM | POA: Diagnosis not present

## 2021-07-02 DIAGNOSIS — M9901 Segmental and somatic dysfunction of cervical region: Secondary | ICD-10-CM | POA: Diagnosis not present

## 2021-07-02 DIAGNOSIS — M9904 Segmental and somatic dysfunction of sacral region: Secondary | ICD-10-CM | POA: Diagnosis not present

## 2021-07-02 DIAGNOSIS — M9903 Segmental and somatic dysfunction of lumbar region: Secondary | ICD-10-CM | POA: Diagnosis not present

## 2021-07-02 DIAGNOSIS — M9902 Segmental and somatic dysfunction of thoracic region: Secondary | ICD-10-CM | POA: Diagnosis not present

## 2021-07-03 ENCOUNTER — Encounter: Payer: Self-pay | Admitting: Internal Medicine

## 2021-07-03 ENCOUNTER — Other Ambulatory Visit: Payer: Self-pay

## 2021-07-03 ENCOUNTER — Ambulatory Visit (INDEPENDENT_AMBULATORY_CARE_PROVIDER_SITE_OTHER): Payer: Medicare HMO | Admitting: Internal Medicine

## 2021-07-03 VITALS — BP 114/76 | HR 70 | Temp 98.3°F | Ht 66.0 in | Wt 162.0 lb

## 2021-07-03 DIAGNOSIS — D75839 Thrombocytosis, unspecified: Secondary | ICD-10-CM | POA: Diagnosis not present

## 2021-07-03 DIAGNOSIS — E039 Hypothyroidism, unspecified: Secondary | ICD-10-CM | POA: Diagnosis not present

## 2021-07-03 DIAGNOSIS — I10 Essential (primary) hypertension: Secondary | ICD-10-CM | POA: Diagnosis not present

## 2021-07-03 DIAGNOSIS — Z6826 Body mass index (BMI) 26.0-26.9, adult: Secondary | ICD-10-CM

## 2021-07-03 DIAGNOSIS — E663 Overweight: Secondary | ICD-10-CM | POA: Diagnosis not present

## 2021-07-03 MED ORDER — HYDROCHLOROTHIAZIDE 12.5 MG PO TABS: 12.5000 mg | ORAL_TABLET | Freq: Every day | ORAL | 1 refills | Status: DC

## 2021-07-03 NOTE — Patient Instructions (Signed)
Cooking With Less Salt Cooking with less salt is one way to reduce the amount of sodium you get from food. Sodium is one of the elements that make up salt. It is found naturally in foods and is also added to certain foods. Depending on your condition and overall health, your health care provider or dietitian may recommend that you reduce your sodium intake. Most people should have less than 2,300 milligrams (mg) of sodium each day. If you have high blood pressure (hypertension), you may need to limit your sodium to 1,500 mg each day. Follow the tipsbelow to help reduce your sodium intake. What are tips for eating less sodium? Reading food labels  Check the food label before buying or using packaged ingredients. Always check the label for the serving size and sodium content. Look for products with no more than 140 mg of sodium in one serving. Check the % Daily Value column to see what percent of the daily recommended amount of sodium is provided in one serving of the product. Foods with 5% or less in this column are considered low in sodium. Foods with 20% or higher are considered high in sodium. Do not choose foods with salt as one of the first three ingredients on the ingredients list. If salt is one of the first three ingredients, it usually means the item is high in sodium.  Shopping Buy sodium-free or low-sodium products. Look for the following words on food labels: Low-sodium. Sodium-free. Reduced-sodium. No salt added. Unsalted. Always check the sodium content even if foods are labeled as low-sodium or no salt added. Buy fresh foods. Cooking Use herbs, seasonings without salt, and spices as substitutes for salt. Use sodium-free baking soda when baking. Grill, braise, or roast foods to add flavor with less salt. Avoid adding salt to pasta, rice, or hot cereals. Drain and rinse canned vegetables, beans, and meat before use. Avoid adding salt when cooking sweets and desserts. Cook with  low-sodium ingredients. What foods are high in sodium? Vegetables Regular canned vegetables (not low-sodium or reduced-sodium). Sauerkraut, pickled vegetables, and relishes. Olives. French fries. Onion rings. Regular canned tomato sauce and paste. Regular tomato and vegetable juice. Frozenvegetables in sauces. Grains Instant hot cereals. Bread stuffing, pancake, and biscuit mixes. Croutons. Seasoned rice or pasta mixes. Noodle soup cups. Boxed or frozen macaroni and cheese. Regular salted crackers. Self-rising flour. Rolls. Bagels. Flourtortillas and wraps. Meats and other proteins Meat or fish that is salted, canned, smoked, cured, spiced, or pickled. This includes bacon, ham, sausages, hot dogs, corned beef, chipped beef, meat loaves, salt pork, jerky, pickled herring, anchovies, regular canned tuna, andsardines. Salted nuts. Dairy Processed cheese and cheese spreads. Cheese curds. Blue cheese. Feta cheese.String cheese. Regular cottage cheese. Buttermilk. Canned milk. The items listed above may not be a complete list of foods high in sodium. Actual amounts of sodium may be different depending on processing. Contact a dietitian for more information. What foods are low in sodium? Fruits Fresh, frozen, or canned fruit with no sauce added. Fruit juice. Vegetables Fresh or frozen vegetables with no sauce added. "No salt added" canned vegetables. "No salt added" tomato sauce and paste. Low-sodium orreduced-sodium tomato and vegetable juice. Grains Noodles, pasta, quinoa, rice. Shredded or puffed wheat or puffed rice. Regular or quick oats (not instant). Low-sodium crackers. Low-sodium bread. Whole-grainbread and whole-grain pasta. Unsalted popcorn. Meats and other proteins Fresh or frozen whole meats, poultry (not injected with sodium), and fish with no sauce added. Unsalted nuts. Dried peas, beans, and   lentils without added salt. Unsalted canned beans. Eggs. Unsalted nut butters. Low-sodium canned  tunaor chicken. Dairy Milk. Soy milk. Yogurt. Low-sodium cheeses, such as Swiss, Monterey Jack, mozzarella, and ricotta. Sherbet or ice cream (keep to  cup per serving).Cream cheese. Fats and oils Unsalted butter or margarine. Other foods Homemade pudding. Sodium-free baking soda and baking powder. Herbs and spices.Low-sodium seasoning mixes. Beverages Coffee and tea. Carbonated beverages. The items listed above may not be a complete list of foods low in sodium. Actual amounts of sodium may be different depending on processing. Contact a dietitian for more information. What are some salt alternatives when cooking? The following are herbs, seasonings, and spices that can be used instead of salt to flavor your food. Herbs should be fresh or dried. Do not choose packaged mixes. Next to the name of the herb, spice, or seasoning aresome examples of foods you can pair it with. Herbs [place] leaves - Soups, meat and vegetable dishes, and spaghetti sauce. Basil - Italian dishes, soups, pasta, and fish dishes. Cilantro - Meat, poultry, and vegetable dishes. Chili powder - Marinades and Mexican dishes. Chives - Salad dressings and potato dishes. Cumin - Mexican dishes, couscous, and meat dishes. Dill - Fish dishes, sauces, and salads. Fennel - Meat and vegetable dishes, breads, and cookies. Garlic (do not use garlic salt) - Italian dishes, meat dishes, salad dressings, and sauces. Marjoram - Soups, potato dishes, and meat dishes. Oregano - Pizza and spaghetti sauce. Parsley - Salads, soups, pasta, and meat dishes. Rosemary - Italian dishes, salad dressings, soups, and red meats. Saffron - Fish dishes, pasta, and some poultry dishes. Sage - Stuffings and sauces. Tarragon - Fish and poultry dishes. Thyme - Stuffing, meat, and fish dishes. Seasonings Lemon juice - Fish dishes, poultry dishes, vegetables, and salads. Vinegar - Salad dressings, vegetables, and fish dishes. Spices Cinnamon - Sweet  dishes, such as cakes, cookies, and puddings. Cloves - Gingerbread, puddings, and marinades for meats. Curry - Vegetable dishes, fish and poultry dishes, and stir-fry dishes. Ginger - Vegetable dishes, fish dishes, and stir-fry dishes. Nutmeg - Pasta, vegetables, poultry, fish dishes, and custard. Summary Cooking with less salt is one way to reduce the amount of sodium that you get from food. Buy sodium-free or low-sodium products. Check the food label before using or buying packaged ingredients. Use herbs, seasonings without salt, and spices as substitutes for salt in foods. This information is not intended to replace advice given to you by your health care provider. Make sure you discuss any questions you have with your healthcare provider. Document Revised: 05/31/2019 Document Reviewed: 05/31/2019 Elsevier Patient Education  2022 Elsevier Inc.  

## 2021-07-03 NOTE — Progress Notes (Signed)
I,Katawbba Wiggins,acting as a Education administrator for Maximino Greenland, MD.,have documented all relevant documentation on the behalf of Maximino Greenland, MD,as directed by  Maximino Greenland, MD while in the presence of Maximino Greenland, MD.  This visit occurred during the SARS-CoV-2 public health emergency.  Safety protocols were in place, including screening questions prior to the visit, additional usage of staff PPE, and extensive cleaning of exam room while observing appropriate contact time as indicated for disinfecting solutions.  Subjective:     Patient ID: Maria Hall , female    DOB: May 02, 1951 , 71 y.o.   MRN: [id number]   Chief Complaint  Patient presents with   Hypertension    HPI  She presents today for blood pressure check. She reports compliance with meds. Denies headaches, chest pain and shortness of breath. She states she is exercising 3 days per week 20min-1hr.   Hypertension This is a chronic problem. The current episode started more than 1 year ago. The problem has been gradually improving since onset. The problem is controlled. Pertinent negatives include no blurred vision, palpitations or peripheral edema. Past treatments include diuretics. The current treatment provides moderate improvement. There are no compliance problems.  Identifiable causes of hypertension include a thyroid problem.  Thyroid Problem Presents for follow-up visit. Patient reports no depressed mood, diaphoresis, menstrual problem, palpitations or tremors. The symptoms have been stable.    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 Take 2 caplets daily, Disp: , Rfl:    Ascorbic  Acid (VITAMIN C) 1000 MG tablet, Take 1,000 mg by mouth daily., Disp: , Rfl:    aspirin EC 81 MG tablet, Take 81 mg by mouth daily., Disp: , Rfl:    cholecalciferol (VITAMIN D) 1000 units tablet, Take 1,000 Units by mouth daily., Disp: , Rfl:    Docusate Calcium (STOOL SOFTENER PO), Take 100 mg by mouth. Take 2 soft gels daily, Disp: , Rfl:    folic acid (FOLVITE) 1 MG tablet, TAKE 1 TABLET BY MOUTH EVERY DAY, Disp: 90 tablet, Rfl: 1   HYDROcodone-acetaminophen (NORCO/VICODIN) 5-325 MG tablet, Take 1 tablet by mouth every 6 (six) hours as needed for moderate pain., Disp: 30 tablet, Rfl: 0   levothyroxine (SYNTHROID) 88 MCG tablet, TAKE 1 TABLET (88 MCG TOTAL) BY MOUTH DAILY BEFORE BREAKFAST. MON - SAT ONLY, Disp: 78 tablet, Rfl: 1   Omega 3-6-9 Fatty Acids (OMEGA 3-6-9 COMPLEX PO), Take 1,600 mg by mouth daily at 6 (six) AM., Disp: , Rfl:    vitamin E 400 UNIT capsule, Take 400 Units by mouth daily., Disp: , Rfl:    benzonatate (TESSALON PERLES) 100 MG capsule, Take 1 capsule (100 mg total) by mouth 3 (three) times daily as needed for cough. (Patient not taking: Reported on 06/25/2021), Disp: 30 capsule, Rfl: 0   hydrochlorothiazide (HYDRODIURIL) 12.5 MG tablet, Take 1 tablet (12.5 mg total) by mouth daily., Disp: 90 tablet, Rfl: 1   meclizine (ANTIVERT) 12.5 MG tablet, Take 1 tablet (12.5 mg total) by mouth 3 (  three) times daily as needed for dizziness. (Patient not taking: Reported on 07/03/2021), Disp: 30 tablet, Rfl: 0   ondansetron (ZOFRAN) 4 MG tablet, Take 1 tablet (4 mg total) by mouth daily as needed for nausea or vomiting. (Patient not taking: Reported on 06/25/2021), Disp: 30 tablet, Rfl: 1   No Known Allergies   Review of Systems  Constitutional: Negative.  Negative for diaphoresis.  Eyes:  Negative for blurred vision.  Respiratory: Negative.    Cardiovascular: Negative.  Negative for palpitations.  Gastrointestinal: Negative.   Genitourinary:  Negative for menstrual problem.   Neurological:  Negative for tremors.  Psychiatric/Behavioral: Negative.    All other systems reviewed and are negative.   Today's Vitals   07/03/21 0926  BP: 114/76  Pulse: 70  Temp: 98.3 F (36.8 C)  Weight: 162 lb (73.5 kg)  Height: 5\' 6"  (1.676 m)  PainSc: 0-No pain   Body mass index is 26.15 kg/m.  Wt Readings from Last 3 Encounters:  07/03/21 162 lb (73.5 kg)  06/25/21 160 lb (72.6 kg)  05/21/21 160 lb 9.6 oz (72.8 kg)    BP Readings from Last 3 Encounters:  07/03/21 114/76  05/21/21 110/70  05/12/21 122/68    Objective:  Physical Exam Vitals and nursing note reviewed.  Constitutional:      Appearance: Normal appearance.  HENT:     Head: Normocephalic and atraumatic.     Nose:     Comments: Masked     Mouth/Throat:     Comments: Masked  Eyes:     Extraocular Movements: Extraocular movements intact.  Cardiovascular:     Rate and Rhythm: Normal rate and regular rhythm.     Heart sounds: Normal heart sounds.  Pulmonary:     Effort: Pulmonary effort is normal.     Breath sounds: Normal breath sounds.  Musculoskeletal:     Cervical back: Normal range of motion.  Skin:    General: Skin is warm.  Neurological:     General: No focal deficit present.     Mental Status: She is alert.  Psychiatric:        Mood and Affect: Mood normal.        Behavior: Behavior normal.        Assessment And Plan:     1. Essential hypertension, benign Comments: Chronic, well controlled. She will c/w HCTZ. Renal fxn from Nov 2022 reviewed. She will rto in May 2023 for her next physical exam.   2. Primary hypothyroidism Comments: I will check thyroid panel and adjust meds as needed.  - TSH + free T4 - Liver Profile  3. Thrombocytosis Comments: Platelets above 500 at least visit. I will recheck CBC w/ diff today. She is agreeable to Hematology eval if still elevated. - CBC with Diff  4. Overweight with body mass index (BMI) of 26 to 26.9 in adult Comments: Her BMI is  acceptable for her demographic. Encouraged to aim for at least 150 minutes of exercise per week.   Patient was given opportunity to ask questions. Patient verbalized understanding of the plan and was able to repeat key elements of the plan. All questions were answered to their satisfaction.   I, Maximino Greenland, MD, have reviewed all documentation for this visit. The documentation on 07/03/21 for the exam, diagnosis, procedures, and orders are all accurate and complete.   IF YOU HAVE BEEN REFERRED TO A SPECIALIST, IT MAY TAKE 1-2 WEEKS TO SCHEDULE/PROCESS THE REFERRAL. IF YOU HAVE NOT HEARD FROM  US/SPECIALIST IN TWO WEEKS, PLEASE GIVE [place] A CALL AT [phone] X [phone].   THE PATIENT IS ENCOURAGED TO PRACTICE SOCIAL DISTANCING DUE TO THE COVID-19 PANDEMIC.

## 2021-07-04 LAB — CBC WITH DIFFERENTIAL/PLATELET
Basophils Absolute: 0 10*3/uL (ref 0.0–0.2)
Basos: 0 %
EOS (ABSOLUTE): 0.1 10*3/uL (ref 0.0–0.4)
Eos: 1 %
Hematocrit: 41.6 % (ref 34.0–46.6)
Hemoglobin: 13.7 g/dL (ref 11.1–15.9)
Immature Grans (Abs): 0 10*3/uL (ref 0.0–0.1)
Immature Granulocytes: 0 %
Lymphocytes Absolute: 2 10*3/uL (ref 0.7–3.1)
Lymphs: 25 %
MCH: 28.4 pg (ref 26.6–33.0)
MCHC: 32.9 g/dL (ref 31.5–35.7)
MCV: 86 fL (ref 79–97)
Monocytes Absolute: 0.5 10*3/uL (ref 0.1–0.9)
Monocytes: 6 %
Neutrophils Absolute: 5.5 10*3/uL (ref 1.4–7.0)
Neutrophils: 68 %
Platelets: 553 10*3/uL — ABNORMAL HIGH (ref 150–450)
RBC: 4.82 x10E6/uL (ref 3.77–5.28)
RDW: 14 % (ref 11.7–15.4)
WBC: 8.1 10*3/uL (ref 3.4–10.8)

## 2021-07-04 LAB — HEPATIC FUNCTION PANEL
ALT: 14 IU/L (ref 0–32)
AST: 19 IU/L (ref 0–40)
Albumin: 4 g/dL (ref 3.7–4.7)
Alkaline Phosphatase: 117 IU/L (ref 44–121)
Bilirubin Total: 0.5 mg/dL (ref 0.0–1.2)
Bilirubin, Direct: 0.17 mg/dL (ref 0.00–0.40)
Total Protein: 6.6 g/dL (ref 6.0–8.5)

## 2021-07-04 LAB — TSH+FREE T4
Free T4: 1.07 ng/dL (ref 0.82–1.77)
TSH: 4.87 u[IU]/mL — ABNORMAL HIGH (ref 0.450–4.500)

## 2021-07-07 ENCOUNTER — Ambulatory Visit: Payer: Medicare HMO | Admitting: Internal Medicine

## 2021-07-14 DIAGNOSIS — R69 Illness, unspecified: Secondary | ICD-10-CM | POA: Diagnosis not present

## 2021-07-14 DIAGNOSIS — F331 Major depressive disorder, recurrent, moderate: Secondary | ICD-10-CM | POA: Diagnosis not present

## 2021-07-30 DIAGNOSIS — M9904 Segmental and somatic dysfunction of sacral region: Secondary | ICD-10-CM | POA: Diagnosis not present

## 2021-07-30 DIAGNOSIS — M9902 Segmental and somatic dysfunction of thoracic region: Secondary | ICD-10-CM | POA: Diagnosis not present

## 2021-07-30 DIAGNOSIS — M9901 Segmental and somatic dysfunction of cervical region: Secondary | ICD-10-CM | POA: Diagnosis not present

## 2021-07-30 DIAGNOSIS — M9903 Segmental and somatic dysfunction of lumbar region: Secondary | ICD-10-CM | POA: Diagnosis not present

## 2021-07-31 ENCOUNTER — Other Ambulatory Visit: Payer: Self-pay

## 2021-07-31 DIAGNOSIS — E039 Hypothyroidism, unspecified: Secondary | ICD-10-CM

## 2021-08-04 DIAGNOSIS — F331 Major depressive disorder, recurrent, moderate: Secondary | ICD-10-CM | POA: Diagnosis not present

## 2021-08-04 DIAGNOSIS — R69 Illness, unspecified: Secondary | ICD-10-CM | POA: Diagnosis not present

## 2021-08-13 ENCOUNTER — Other Ambulatory Visit: Payer: Self-pay | Admitting: Internal Medicine

## 2021-08-13 DIAGNOSIS — D573 Sickle-cell trait: Secondary | ICD-10-CM

## 2021-08-13 DIAGNOSIS — D473 Essential (hemorrhagic) thrombocythemia: Secondary | ICD-10-CM

## 2021-08-18 DIAGNOSIS — R69 Illness, unspecified: Secondary | ICD-10-CM | POA: Diagnosis not present

## 2021-08-18 DIAGNOSIS — F331 Major depressive disorder, recurrent, moderate: Secondary | ICD-10-CM | POA: Diagnosis not present

## 2021-08-21 DIAGNOSIS — M48062 Spinal stenosis, lumbar region with neurogenic claudication: Secondary | ICD-10-CM | POA: Diagnosis not present

## 2021-09-01 DIAGNOSIS — R69 Illness, unspecified: Secondary | ICD-10-CM | POA: Diagnosis not present

## 2021-09-01 DIAGNOSIS — F330 Major depressive disorder, recurrent, mild: Secondary | ICD-10-CM | POA: Diagnosis not present

## 2021-09-03 ENCOUNTER — Other Ambulatory Visit: Payer: Self-pay | Admitting: Nurse Practitioner

## 2021-09-03 DIAGNOSIS — M9903 Segmental and somatic dysfunction of lumbar region: Secondary | ICD-10-CM | POA: Diagnosis not present

## 2021-09-03 DIAGNOSIS — M9901 Segmental and somatic dysfunction of cervical region: Secondary | ICD-10-CM | POA: Diagnosis not present

## 2021-09-03 DIAGNOSIS — M9904 Segmental and somatic dysfunction of sacral region: Secondary | ICD-10-CM | POA: Diagnosis not present

## 2021-09-03 DIAGNOSIS — M9902 Segmental and somatic dysfunction of thoracic region: Secondary | ICD-10-CM | POA: Diagnosis not present

## 2021-09-10 ENCOUNTER — Other Ambulatory Visit: Payer: Medicare HMO

## 2021-09-10 ENCOUNTER — Other Ambulatory Visit: Payer: Self-pay

## 2021-09-10 DIAGNOSIS — E039 Hypothyroidism, unspecified: Secondary | ICD-10-CM | POA: Diagnosis not present

## 2021-09-11 LAB — TSH: TSH: 1.75 u[IU]/mL (ref 0.450–4.500)

## 2021-09-15 ENCOUNTER — Other Ambulatory Visit: Payer: Medicare HMO

## 2021-09-17 ENCOUNTER — Encounter: Payer: Medicare HMO | Admitting: Nurse Practitioner

## 2021-09-17 DIAGNOSIS — R69 Illness, unspecified: Secondary | ICD-10-CM | POA: Diagnosis not present

## 2021-09-17 DIAGNOSIS — F330 Major depressive disorder, recurrent, mild: Secondary | ICD-10-CM | POA: Diagnosis not present

## 2021-09-24 ENCOUNTER — Other Ambulatory Visit: Payer: Self-pay | Admitting: Nurse Practitioner

## 2021-09-24 DIAGNOSIS — G4700 Insomnia, unspecified: Secondary | ICD-10-CM

## 2021-09-24 DIAGNOSIS — R42 Dizziness and giddiness: Secondary | ICD-10-CM

## 2021-09-24 DIAGNOSIS — R110 Nausea: Secondary | ICD-10-CM

## 2021-10-07 DIAGNOSIS — M9903 Segmental and somatic dysfunction of lumbar region: Secondary | ICD-10-CM | POA: Diagnosis not present

## 2021-10-07 DIAGNOSIS — M9901 Segmental and somatic dysfunction of cervical region: Secondary | ICD-10-CM | POA: Diagnosis not present

## 2021-10-07 DIAGNOSIS — M9902 Segmental and somatic dysfunction of thoracic region: Secondary | ICD-10-CM | POA: Diagnosis not present

## 2021-10-07 DIAGNOSIS — M9904 Segmental and somatic dysfunction of sacral region: Secondary | ICD-10-CM | POA: Diagnosis not present

## 2021-10-09 IMAGING — MG DIGITAL SCREENING BILAT W/ TOMO W/ CAD
8 series · 8 of 24 positions shown · non-contrast
Comparison: Previous exam(s).

CLINICAL DATA: Screening.

EXAM:
DIGITAL SCREENING BILATERAL MAMMOGRAM WITH TOMO AND CAD

[R CC synth-2D]
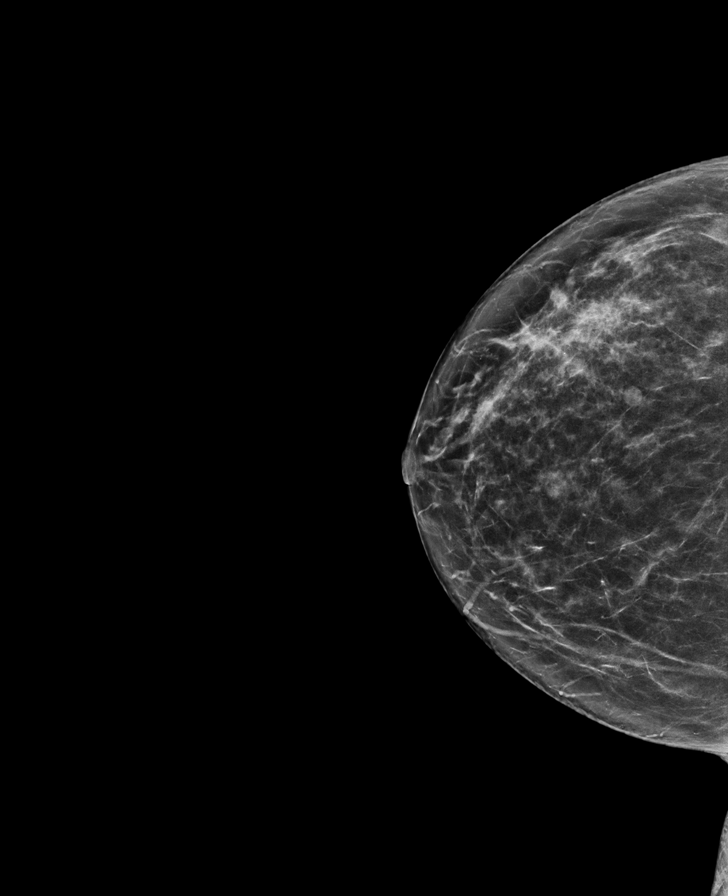

[R MLO synth-2D]
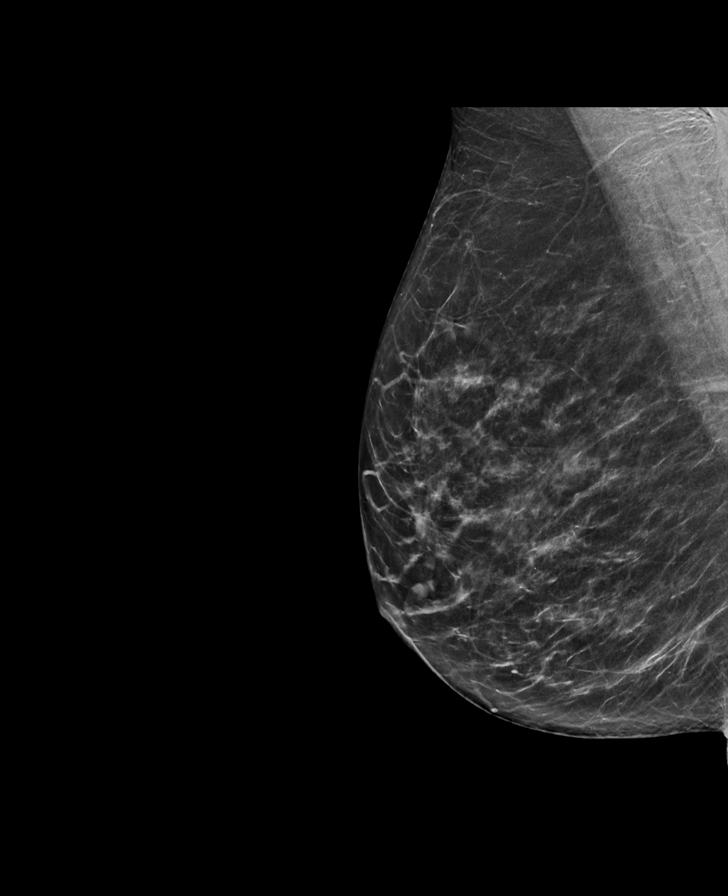

[L MLO synth-2D]
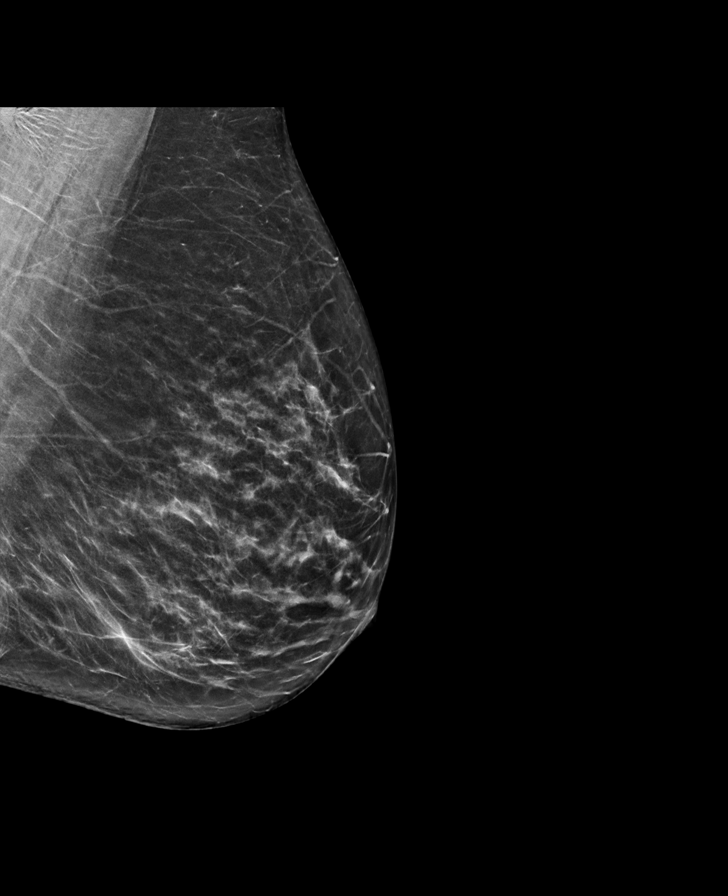

[L CC synth-2D]
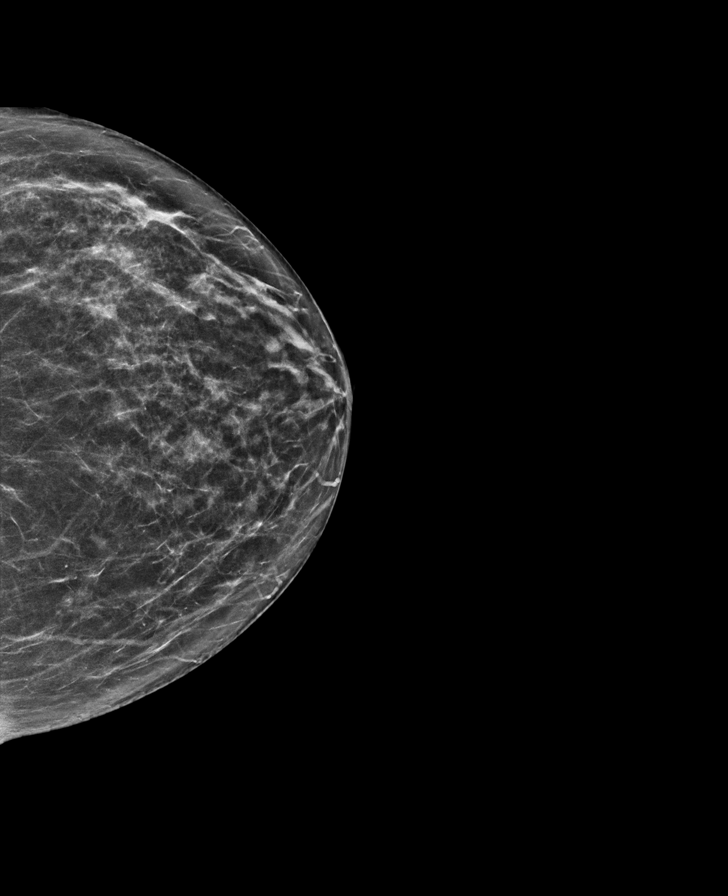

[L MLO tomo · tomo slice 35/70.0]
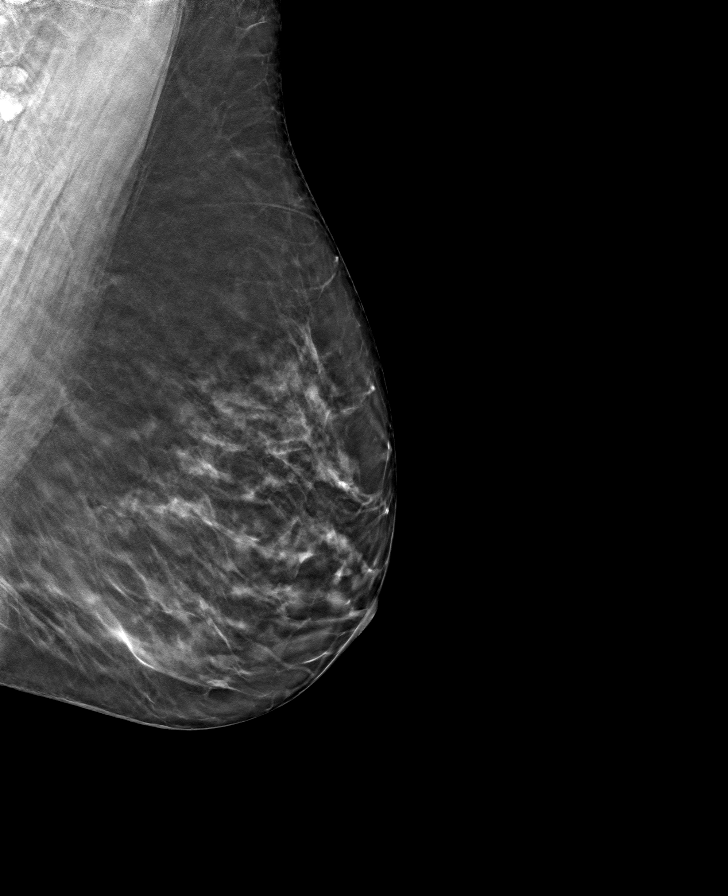

[L CC tomo · tomo slice 32/63.0]
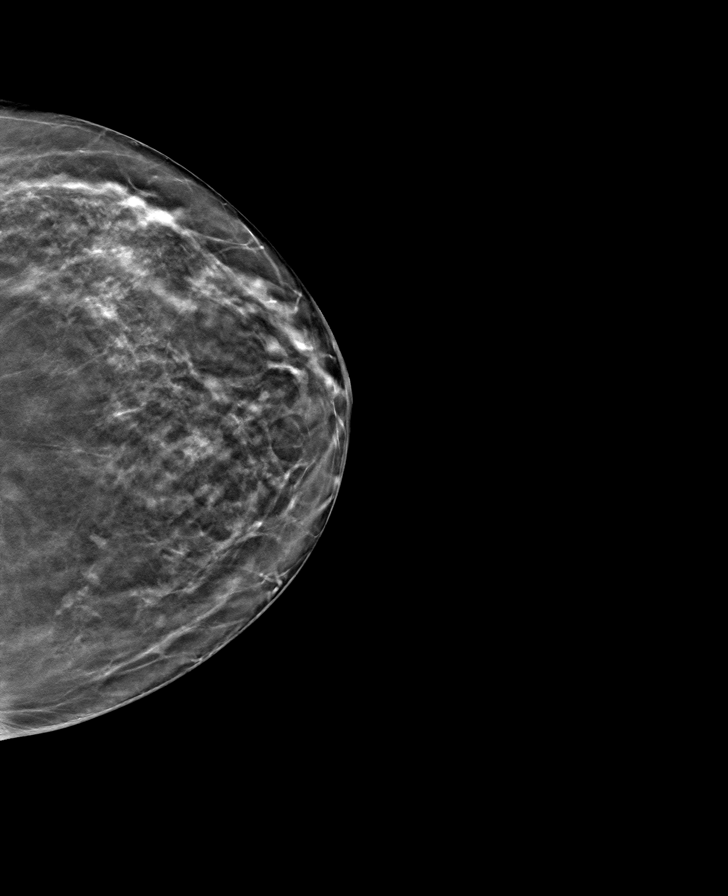

[R CC tomo · tomo slice 35/69.0]
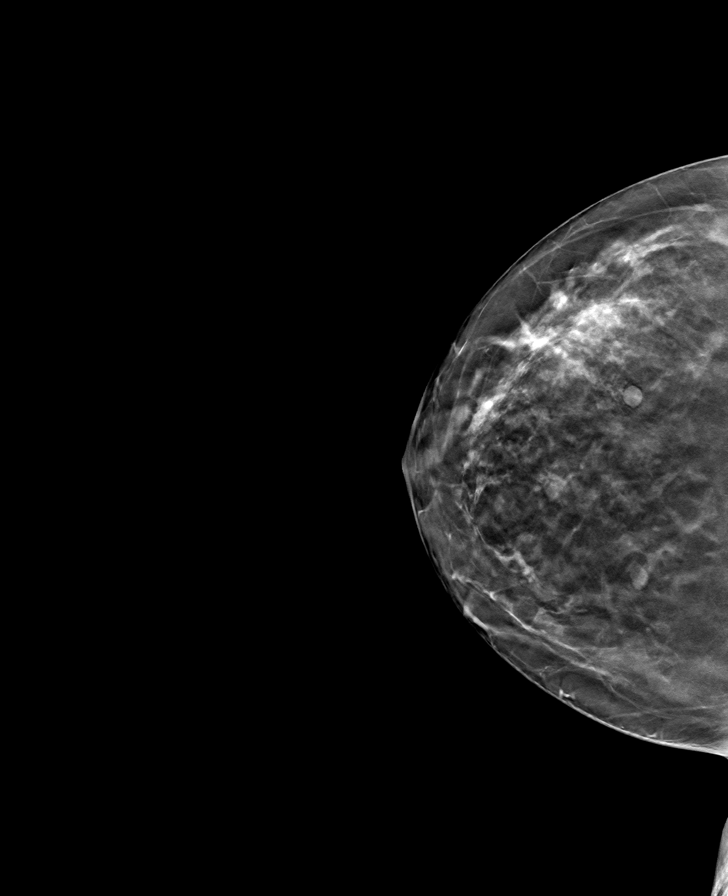

[R MLO tomo · tomo slice 35/69.0]
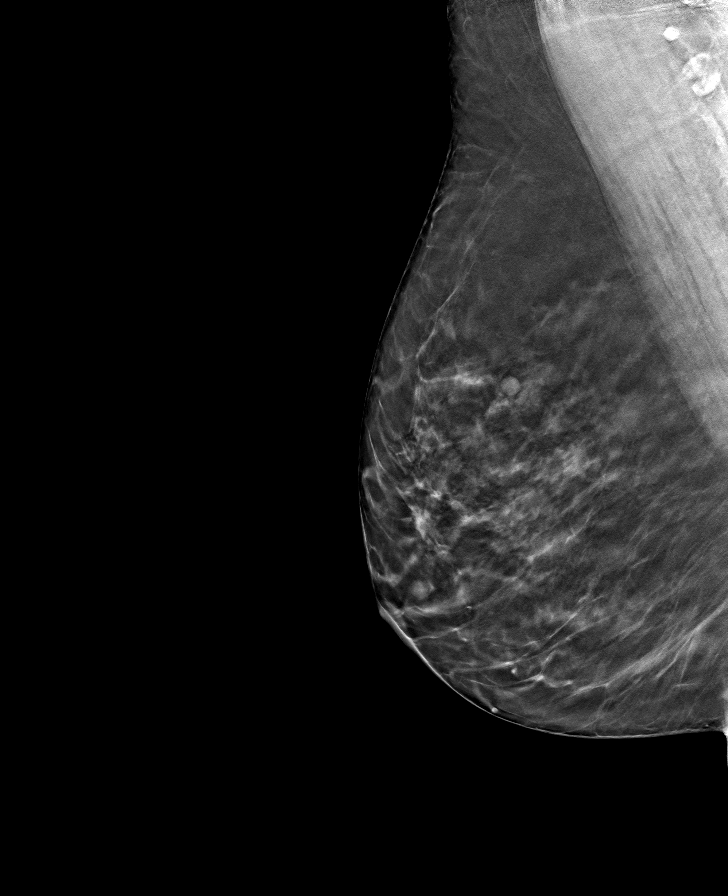

[8 of 24 positions shown; findings below may reference images not displayed]

ACR Breast Density Category b: There are scattered areas of
fibroglandular density.
FINDINGS: There are no findings suspicious for malignancy. Images were
processed with CAD.
IMPRESSION: No mammographic evidence of malignancy. A result letter of this
screening mammogram will be mailed directly to the patient.

RECOMMENDATION:
Screening mammogram in one year. (Code:[id number])

BI-RADS CATEGORY  1: Negative.

## 2021-10-13 DIAGNOSIS — F330 Major depressive disorder, recurrent, mild: Secondary | ICD-10-CM | POA: Diagnosis not present

## 2021-10-13 DIAGNOSIS — R69 Illness, unspecified: Secondary | ICD-10-CM | POA: Diagnosis not present

## 2021-10-27 DIAGNOSIS — F330 Major depressive disorder, recurrent, mild: Secondary | ICD-10-CM | POA: Diagnosis not present

## 2021-10-27 DIAGNOSIS — R69 Illness, unspecified: Secondary | ICD-10-CM | POA: Diagnosis not present

## 2021-11-04 DIAGNOSIS — M9904 Segmental and somatic dysfunction of sacral region: Secondary | ICD-10-CM | POA: Diagnosis not present

## 2021-11-04 DIAGNOSIS — M9901 Segmental and somatic dysfunction of cervical region: Secondary | ICD-10-CM | POA: Diagnosis not present

## 2021-11-04 DIAGNOSIS — M9902 Segmental and somatic dysfunction of thoracic region: Secondary | ICD-10-CM | POA: Diagnosis not present

## 2021-11-04 DIAGNOSIS — M9903 Segmental and somatic dysfunction of lumbar region: Secondary | ICD-10-CM | POA: Diagnosis not present

## 2021-11-10 ENCOUNTER — Encounter: Payer: Self-pay | Admitting: Internal Medicine

## 2021-11-10 ENCOUNTER — Ambulatory Visit (INDEPENDENT_AMBULATORY_CARE_PROVIDER_SITE_OTHER): Payer: Medicare HMO | Admitting: Internal Medicine

## 2021-11-10 VITALS — BP 116/70 | HR 86 | Temp 97.7°F | Ht 66.0 in | Wt 159.0 lb

## 2021-11-10 DIAGNOSIS — Z0000 Encounter for general adult medical examination without abnormal findings: Secondary | ICD-10-CM | POA: Diagnosis not present

## 2021-11-10 DIAGNOSIS — E039 Hypothyroidism, unspecified: Secondary | ICD-10-CM | POA: Diagnosis not present

## 2021-11-10 DIAGNOSIS — R0981 Nasal congestion: Secondary | ICD-10-CM | POA: Diagnosis not present

## 2021-11-10 DIAGNOSIS — I10 Essential (primary) hypertension: Secondary | ICD-10-CM | POA: Diagnosis not present

## 2021-11-10 DIAGNOSIS — M25561 Pain in right knee: Secondary | ICD-10-CM | POA: Diagnosis not present

## 2021-11-10 DIAGNOSIS — D573 Sickle-cell trait: Secondary | ICD-10-CM | POA: Diagnosis not present

## 2021-11-10 DIAGNOSIS — W19XXXA Unspecified fall, initial encounter: Secondary | ICD-10-CM | POA: Diagnosis not present

## 2021-11-10 LAB — POCT URINALYSIS DIPSTICK
Bilirubin, UA: NEGATIVE
Blood, UA: NEGATIVE
Glucose, UA: NEGATIVE
Ketones, UA: NEGATIVE
Leukocytes, UA: NEGATIVE
Nitrite, UA: NEGATIVE
Protein, UA: NEGATIVE
Spec Grav, UA: 1.015 (ref 1.010–1.025)
Urobilinogen, UA: 0.2 E.U./dL
pH, UA: 8.5 — AB (ref 5.0–8.0)

## 2021-11-10 NOTE — Progress Notes (Signed)
Rich Brave [place],acting as a Education administrator for Maximino Greenland, MD.,have documented all relevant documentation on the behalf of Maximino Greenland, MD,as directed by  Maximino Greenland, MD while in the presence of Maximino Greenland, MD.  This visit occurred during the SARS-CoV-2 public health emergency.  Safety protocols were in place, including screening questions prior to the visit, additional usage of staff PPE, and extensive cleaning of exam room while observing appropriate contact time as indicated for disinfecting solutions.  Subjective:     Patient ID: Maria Hall , female    DOB: 11/24/1950 , 71 y.o.   MRN: [id number]   Chief Complaint  Patient presents with   Annual Exam    HPI  Patient presents today for a physical. Patient reports compliance with her meds. She denies headaches, chest pain and shortness of breath.   Patient reports she fell yesterday while going back into the house from the garage. She reports having knee and thumb pain. She thinks she tripped on the step, admits she was wearing flip flops. She did fall to the ground. Denies LOC. Did not hit her head.    Hypertension This is a chronic problem. The current episode started more than 1 year ago. The problem has been gradually improving since onset. The problem is controlled. Pertinent negatives include no blurred vision, palpitations or peripheral edema. Past treatments include diuretics. The current treatment provides moderate improvement. There are no compliance problems.  Identifiable causes of hypertension include a thyroid problem.  Thyroid Problem Presents for follow-up visit. Patient reports no depressed mood, diaphoresis, menstrual problem, palpitations or tremors. The symptoms have been stable.    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 Take 2 caplets daily, Disp: , Rfl:    Ascorbic Acid (VITAMIN C) 1000 MG tablet, Take 1,000 mg by mouth daily., Disp: , Rfl:    aspirin EC 81 MG tablet, Take 81 mg by mouth daily., Disp: , Rfl:    cholecalciferol (VITAMIN D) 1000 units tablet, Take 1,000 Units by mouth daily., Disp: , Rfl:    Docusate Calcium (STOOL SOFTENER PO), Take 100 mg by mouth. Take 2 soft gels daily, Disp: , Rfl:    folic acid (FOLVITE) 1 MG tablet, TAKE 1 TABLET BY MOUTH EVERY DAY, Disp: 90 tablet, Rfl: 1   hydrochlorothiazide (HYDRODIURIL) 12.5 MG tablet, Take 1 tablet (12.5 mg total) by mouth daily., Disp: 90 tablet, Rfl: 1   HYDROcodone-acetaminophen (NORCO/VICODIN) 5-325 MG tablet, Take 1 tablet by mouth every 6 (six) hours as needed for moderate pain., Disp: 30 tablet, Rfl: 0   levothyroxine (SYNTHROID) 88 MCG tablet, TAKE 1 TABLET (88 MCG TOTAL) BY MOUTH DAILY BEFORE BREAKFAST. MON - SAT ONLY, Disp: 78 tablet, Rfl: 1   meclizine (ANTIVERT) 12.5 MG tablet, TAKE 1 TABLET BY MOUTH 3 TIMES DAILY AS NEEDED FOR DIZZINESS., Disp: 30 tablet, Rfl: 0   Omega 3-6-9 Fatty Acids (OMEGA 3-6-9 COMPLEX PO), Take 1,600 mg by mouth daily at 6 (six) AM., Disp: , Rfl:  ondansetron (ZOFRAN) 4 MG tablet, Take 1 tablet (4 mg total) by mouth daily as needed for nausea or vomiting., Disp: 30 tablet, Rfl: 1   vitamin E 400 UNIT capsule, Take 400 Units by mouth daily., Disp: , Rfl:    cetirizine (ZYRTEC ALLERGY) 10 MG tablet, Take 1 tablet (10 mg total) by mouth daily., Disp: 90 tablet, Rfl: 2   No Known Allergies    The patient states she uses post menopausal status for birth control. Last LMP was No LMP recorded. Patient has had a hysterectomy.. Negative for Dysmenorrhea. Negative for: breast discharge, breast lump(s), breast pain and breast self exam. Associated symptoms  include abnormal vaginal bleeding. Pertinent negatives include abnormal bleeding (hematology), anxiety, decreased libido, depression, difficulty falling sleep, dyspareunia, history of infertility, nocturia, sexual dysfunction, sleep disturbances, urinary incontinence, urinary urgency, vaginal discharge and vaginal itching. Diet regular.The patient states her exercise level is  MODERATE.  . The patient's tobacco use is:  Social History   Tobacco Use  Smoking Status Former   Packs/day: 1.00   Years: 20.00   Pack years: 20.00   Types: Cigarettes   Quit date: 1990   Years since quitting: 33.4  Smokeless Tobacco Never  . She has been exposed to passive smoke. The patient's alcohol use is:  Social History   Substance and Sexual Activity  Alcohol Use Not Currently    Review of Systems  Constitutional: Negative.  Negative for diaphoresis.  HENT:  Positive for congestion.         She denies fever/chills. Denies ill contacts.  Eyes:  Positive for pain. Negative for blurred vision.  Respiratory: Negative.    Cardiovascular: Negative.  Negative for palpitations.  Endocrine: Negative.   Genitourinary: Negative.  Negative for menstrual problem.  Musculoskeletal:  Positive for arthralgias.       She c/o r knee pain. States she fell walking back into her home from the garage yesterday (5/21). She had on flip flops. She fell onto the concrete steps and her thumb bent back when she hit the floor. She did not hit her head.   Skin: Negative.   Allergic/Immunologic: Negative.   Neurological: Negative.  Negative for tremors.  Hematological: Negative.   Psychiatric/Behavioral: Negative.      Today's Vitals   11/10/21 1000  BP: 116/70  Pulse: 86  Temp: 97.7 F (36.5 C)  Weight: 159 lb (72.1 kg)  Height: _0  (1.676 m)   Body mass index is 25.66 kg/m.  Wt Readings from Last 3 Encounters:  11/10/21 159 lb (72.1 kg)  07/03/21 162 lb (73.5 kg)  06/25/21 160 lb (72.6 kg)    Objective:   Physical Exam Constitutional:      General: She is not in acute distress.    Appearance: Normal appearance. She is well-developed. She is obese.  HENT:     Head: Normocephalic and atraumatic.     Right Ear: Hearing, tympanic membrane, ear canal and external ear normal. There is no impacted cerumen.     Left Ear: Hearing, tympanic membrane, ear canal and external ear normal. There is no impacted cerumen.     Nose:     Comments: Deferred - masked    Mouth/Throat:     Comments: Deferred - masked Eyes:     General: Lids are normal.     Extraocular Movements: Extraocular movements intact.     Conjunctiva/sclera: Conjunctivae normal.     Pupils: Pupils are equal, round, and reactive to light.  Funduscopic exam:    Right eye: No papilledema.        Left eye: No papilledema.  Neck:     Thyroid: No thyroid mass.     Vascular: No carotid bruit.  Cardiovascular:     Rate and Rhythm: Normal rate and regular rhythm.     Pulses: Normal pulses.     Heart sounds: Normal heart sounds. No murmur heard. Pulmonary:     Effort: Pulmonary effort is normal.     Breath sounds: Normal breath sounds.  Chest:     Chest wall: No mass.  Breasts:    Tanner Score is 5.     Right: Normal. No mass or tenderness.     Left: Normal. No mass or tenderness.  Abdominal:     General: Abdomen is flat. Bowel sounds are normal. There is no distension.     Palpations: Abdomen is soft.     Tenderness: There is no abdominal tenderness.  Genitourinary:    Rectum: Guaiac result negative.  Musculoskeletal:        General: Swelling and tenderness present. Normal range of motion.     Cervical back: Full passive range of motion without pain, normal range of motion and neck supple.     Right lower leg: No edema.     Left lower leg: No edema.     Comments: Tenderness, swelling and abrasion of right knee.   Lymphadenopathy:     Upper Body:     Right upper body: No supraclavicular, axillary or pectoral adenopathy.      Left upper body: No supraclavicular, axillary or pectoral adenopathy.  Skin:    General: Skin is warm and dry.     Capillary Refill: Capillary refill takes less than 2 seconds.  Neurological:     General: No focal deficit present.     Mental Status: She is alert and oriented to person, place, and time.     Cranial Nerves: No cranial nerve deficit.     Sensory: No sensory deficit.  Psychiatric:        Mood and Affect: Mood normal.        Behavior: Behavior normal.        Thought Content: Thought content normal.        Judgment: Judgment normal.      Assessment And Plan:     1. Encounter for general adult medical examination w/o abnormal findings Comments: A full exam was performed. Importance of monthly self breast exams was discussed with the patient. PATIENT IS ADVISED TO GET 30-45 MINUTES REGULAR EXERCISE NO LESS THAN FOUR TO FIVE DAYS PER WEEK - BOTH WEIGHTBEARING EXERCISES AND AEROBIC ARE RECOMMENDED.  PATIENT IS ADVISED TO FOLLOW A HEALTHY DIET WITH AT LEAST SIX FRUITS/VEGGIES PER DAY, DECREASE INTAKE OF RED MEAT, AND TO INCREASE FISH INTAKE TO TWO DAYS PER WEEK.  MEATS/FISH SHOULD NOT BE FRIED, BAKED OR BROILED IS PREFERABLE.  IT IS ALSO IMPORTANT TO CUT BACK ON YOUR SUGAR INTAKE. PLEASE AVOID ANYTHING WITH ADDED SUGAR, CORN SYRUP OR OTHER SWEETENERS. IF YOU MUST USE A SWEETENER, YOU CAN TRY STEVIA. IT IS ALSO IMPORTANT TO AVOID ARTIFICIALLY SWEETENERS AND DIET BEVERAGES. LASTLY, I SUGGEST WEARING SPF 50 SUNSCREEN ON EXPOSED PARTS AND ESPECIALLY WHEN IN THE DIRECT SUNLIGHT FOR AN EXTENDED PERIOD OF TIME.  PLEASE AVOID FAST FOOD RESTAURANTS AND INCREASE YOUR WATER INTAKE.   2. Essential hypertension, benign Comments: Chronic, well controlled. No med changes. She is encouraged to follow low sodium diet. EKG  performed, NSR w/o acute changes.  She will f/u in 6 months.  - POCT Urinalysis Dipstick (81002) - Microalbumin / Creatinine Urine Ratio - EKG 12-Lead - CBC - CMP14+EGFR - Lipid  panel  3. Fall, initial encounter Comments: Occurred on 11/09/21. She is encouraged to avoid wearing flip flops walking around her home.   4. Acute pain of right knee Comments: Secondary to a fall. Advised to apply Voltaren gel to affected area bid-tid prn. May also take Tylenol prn. She will let me know if her sx persist.   5. Sinus congestion Comments: She may try OTC loratadine or Zyrtec daily. She agrees to do rapid COVID test when she returns home today. She will let me know if sx persist.   6. Sickle-cell trait ([place]) Comments: Chronic, stable. She is encouraged to stay well hydrated.    Patient was given opportunity to ask questions. Patient verbalized understanding of the plan and was able to repeat key elements of the plan. All questions were answered to their satisfaction.   I, Maximino Greenland, MD, have reviewed all documentation for this visit. The documentation on 11/10/21 for the exam, diagnosis, procedures, and orders are all accurate and complete.   THE PATIENT IS ENCOURAGED TO PRACTICE SOCIAL DISTANCING DUE TO THE COVID-19 PANDEMIC.

## 2021-11-10 NOTE — Patient Instructions (Signed)

## 2021-11-11 LAB — CMP14+EGFR
ALT: 16 IU/L (ref 0–32)
AST: 27 IU/L (ref 0–40)
Albumin/Globulin Ratio: 1.4 (ref 1.2–2.2)
Albumin: 3.9 g/dL (ref 3.7–4.7)
Alkaline Phosphatase: 135 IU/L — ABNORMAL HIGH (ref 44–121)
BUN/Creatinine Ratio: 8 — ABNORMAL LOW (ref 12–28)
BUN: 7 mg/dL — ABNORMAL LOW (ref 8–27)
Bilirubin Total: 0.4 mg/dL (ref 0.0–1.2)
CO2: 28 mmol/L (ref 20–29)
Calcium: 10.1 mg/dL (ref 8.7–10.3)
Chloride: 101 mmol/L (ref 96–106)
Creatinine, Ser: 0.93 mg/dL (ref 0.57–1.00)
Globulin, Total: 2.8 g/dL (ref 1.5–4.5)
Glucose: 97 mg/dL (ref 70–99)
Potassium: 4.4 mmol/L (ref 3.5–5.2)
Sodium: 141 mmol/L (ref 134–144)
Total Protein: 6.7 g/dL (ref 6.0–8.5)
eGFR: 65 mL/min/{1.73_m2} (ref >59)

## 2021-11-11 LAB — LIPID PANEL
Chol/HDL Ratio: 3.7 ratio (ref 0.0–4.4)
Cholesterol, Total: 195 mg/dL (ref 100–199)
HDL: 53 mg/dL (ref >39)
LDL Chol Calc (NIH): 113 mg/dL — ABNORMAL HIGH (ref 0–99)
Triglycerides: 165 mg/dL — ABNORMAL HIGH (ref 0–149)
VLDL Cholesterol Cal: 29 mg/dL (ref 5–40)

## 2021-11-11 LAB — CBC
Hematocrit: 40.4 % (ref 34.0–46.6)
Hemoglobin: 13.7 g/dL (ref 11.1–15.9)
MCH: 28.2 pg (ref 26.6–33.0)
MCHC: 33.9 g/dL (ref 31.5–35.7)
MCV: 83 fL (ref 79–97)
Platelets: 439 10*3/uL (ref 150–450)
RBC: 4.85 x10E6/uL (ref 3.77–5.28)
RDW: 13.7 % (ref 11.7–15.4)
WBC: 5.7 10*3/uL (ref 3.4–10.8)

## 2021-11-11 LAB — MICROALBUMIN / CREATININE URINE RATIO
Creatinine, Urine: 106.6 mg/dL
Microalb/Creat Ratio: 19 mg/g creat (ref 0–29)
Microalbumin, Urine: 20.4 ug/mL

## 2021-11-13 DIAGNOSIS — M48062 Spinal stenosis, lumbar region with neurogenic claudication: Secondary | ICD-10-CM | POA: Diagnosis not present

## 2021-11-14 ENCOUNTER — Other Ambulatory Visit: Payer: Self-pay

## 2021-11-14 MED ORDER — CETIRIZINE HCL 10 MG PO TABS: 10.0000 mg | ORAL_TABLET | Freq: Every day | ORAL | 2 refills | Status: DC

## 2021-11-23 DIAGNOSIS — W19XXXA Unspecified fall, initial encounter: Secondary | ICD-10-CM | POA: Insufficient documentation

## 2021-11-23 DIAGNOSIS — M25561 Pain in right knee: Secondary | ICD-10-CM | POA: Insufficient documentation

## 2021-11-24 DIAGNOSIS — R69 Illness, unspecified: Secondary | ICD-10-CM | POA: Diagnosis not present

## 2021-11-24 DIAGNOSIS — F330 Major depressive disorder, recurrent, mild: Secondary | ICD-10-CM | POA: Diagnosis not present

## 2021-11-27 DIAGNOSIS — H1045 Other chronic allergic conjunctivitis: Secondary | ICD-10-CM | POA: Diagnosis not present

## 2021-11-27 DIAGNOSIS — H2513 Age-related nuclear cataract, bilateral: Secondary | ICD-10-CM | POA: Diagnosis not present

## 2021-12-10 DIAGNOSIS — M9902 Segmental and somatic dysfunction of thoracic region: Secondary | ICD-10-CM | POA: Diagnosis not present

## 2021-12-10 DIAGNOSIS — M9903 Segmental and somatic dysfunction of lumbar region: Secondary | ICD-10-CM | POA: Diagnosis not present

## 2021-12-10 DIAGNOSIS — M9904 Segmental and somatic dysfunction of sacral region: Secondary | ICD-10-CM | POA: Diagnosis not present

## 2021-12-10 DIAGNOSIS — M9901 Segmental and somatic dysfunction of cervical region: Secondary | ICD-10-CM | POA: Diagnosis not present

## 2021-12-16 ENCOUNTER — Other Ambulatory Visit: Payer: Self-pay | Admitting: Internal Medicine

## 2021-12-16 DIAGNOSIS — Z9181 History of falling: Secondary | ICD-10-CM | POA: Diagnosis not present

## 2021-12-16 DIAGNOSIS — Z1231 Encounter for screening mammogram for malignant neoplasm of breast: Secondary | ICD-10-CM

## 2021-12-16 DIAGNOSIS — J309 Allergic rhinitis, unspecified: Secondary | ICD-10-CM | POA: Diagnosis not present

## 2021-12-16 DIAGNOSIS — Z008 Encounter for other general examination: Secondary | ICD-10-CM | POA: Diagnosis not present

## 2021-12-16 DIAGNOSIS — E039 Hypothyroidism, unspecified: Secondary | ICD-10-CM | POA: Diagnosis not present

## 2021-12-16 DIAGNOSIS — R42 Dizziness and giddiness: Secondary | ICD-10-CM | POA: Diagnosis not present

## 2021-12-16 DIAGNOSIS — R600 Localized edema: Secondary | ICD-10-CM | POA: Diagnosis not present

## 2021-12-16 DIAGNOSIS — Z87891 Personal history of nicotine dependence: Secondary | ICD-10-CM | POA: Diagnosis not present

## 2021-12-16 DIAGNOSIS — M1990 Unspecified osteoarthritis, unspecified site: Secondary | ICD-10-CM | POA: Diagnosis not present

## 2021-12-17 DIAGNOSIS — M9904 Segmental and somatic dysfunction of sacral region: Secondary | ICD-10-CM | POA: Diagnosis not present

## 2021-12-17 DIAGNOSIS — M9903 Segmental and somatic dysfunction of lumbar region: Secondary | ICD-10-CM | POA: Diagnosis not present

## 2021-12-17 DIAGNOSIS — M9901 Segmental and somatic dysfunction of cervical region: Secondary | ICD-10-CM | POA: Diagnosis not present

## 2021-12-17 DIAGNOSIS — M9902 Segmental and somatic dysfunction of thoracic region: Secondary | ICD-10-CM | POA: Diagnosis not present

## 2021-12-22 ENCOUNTER — Ambulatory Visit
Admission: RE | Admit: 2021-12-22 | Discharge: 2021-12-22 | Disposition: A | Discharge status: Home / Self Care | Payer: Medicare HMO | Source: Ambulatory Visit | Attending: Internal Medicine | Admitting: Internal Medicine

## 2021-12-22 DIAGNOSIS — Z1231 Encounter for screening mammogram for malignant neoplasm of breast: Secondary | ICD-10-CM | POA: Diagnosis not present

## 2022-01-05 DIAGNOSIS — F330 Major depressive disorder, recurrent, mild: Secondary | ICD-10-CM | POA: Diagnosis not present

## 2022-01-05 DIAGNOSIS — R69 Illness, unspecified: Secondary | ICD-10-CM | POA: Diagnosis not present

## 2022-01-07 DIAGNOSIS — M9903 Segmental and somatic dysfunction of lumbar region: Secondary | ICD-10-CM | POA: Diagnosis not present

## 2022-01-07 DIAGNOSIS — M9901 Segmental and somatic dysfunction of cervical region: Secondary | ICD-10-CM | POA: Diagnosis not present

## 2022-01-07 DIAGNOSIS — M9904 Segmental and somatic dysfunction of sacral region: Secondary | ICD-10-CM | POA: Diagnosis not present

## 2022-01-07 DIAGNOSIS — M9902 Segmental and somatic dysfunction of thoracic region: Secondary | ICD-10-CM | POA: Diagnosis not present

## 2022-01-14 DIAGNOSIS — M9904 Segmental and somatic dysfunction of sacral region: Secondary | ICD-10-CM | POA: Diagnosis not present

## 2022-01-14 DIAGNOSIS — M9901 Segmental and somatic dysfunction of cervical region: Secondary | ICD-10-CM | POA: Diagnosis not present

## 2022-01-14 DIAGNOSIS — M9903 Segmental and somatic dysfunction of lumbar region: Secondary | ICD-10-CM | POA: Diagnosis not present

## 2022-01-14 DIAGNOSIS — M9902 Segmental and somatic dysfunction of thoracic region: Secondary | ICD-10-CM | POA: Diagnosis not present

## 2022-01-19 DIAGNOSIS — R69 Illness, unspecified: Secondary | ICD-10-CM | POA: Diagnosis not present

## 2022-01-19 DIAGNOSIS — F330 Major depressive disorder, recurrent, mild: Secondary | ICD-10-CM | POA: Diagnosis not present

## 2022-02-02 DIAGNOSIS — R69 Illness, unspecified: Secondary | ICD-10-CM | POA: Diagnosis not present

## 2022-02-02 DIAGNOSIS — F330 Major depressive disorder, recurrent, mild: Secondary | ICD-10-CM | POA: Diagnosis not present

## 2022-02-16 DIAGNOSIS — R69 Illness, unspecified: Secondary | ICD-10-CM | POA: Diagnosis not present

## 2022-02-16 DIAGNOSIS — F330 Major depressive disorder, recurrent, mild: Secondary | ICD-10-CM | POA: Diagnosis not present

## 2022-02-17 DIAGNOSIS — M9901 Segmental and somatic dysfunction of cervical region: Secondary | ICD-10-CM | POA: Diagnosis not present

## 2022-02-17 DIAGNOSIS — M9904 Segmental and somatic dysfunction of sacral region: Secondary | ICD-10-CM | POA: Diagnosis not present

## 2022-02-17 DIAGNOSIS — M9903 Segmental and somatic dysfunction of lumbar region: Secondary | ICD-10-CM | POA: Diagnosis not present

## 2022-02-17 DIAGNOSIS — M9902 Segmental and somatic dysfunction of thoracic region: Secondary | ICD-10-CM | POA: Diagnosis not present

## 2022-02-18 DIAGNOSIS — M48062 Spinal stenosis, lumbar region with neurogenic claudication: Secondary | ICD-10-CM | POA: Diagnosis not present

## 2022-03-05 ENCOUNTER — Encounter: Payer: Self-pay | Admitting: Internal Medicine

## 2022-03-16 DIAGNOSIS — F330 Major depressive disorder, recurrent, mild: Secondary | ICD-10-CM | POA: Diagnosis not present

## 2022-03-16 DIAGNOSIS — R69 Illness, unspecified: Secondary | ICD-10-CM | POA: Diagnosis not present

## 2022-03-24 DIAGNOSIS — H5213 Myopia, bilateral: Secondary | ICD-10-CM | POA: Diagnosis not present

## 2022-03-30 DIAGNOSIS — F330 Major depressive disorder, recurrent, mild: Secondary | ICD-10-CM | POA: Diagnosis not present

## 2022-03-30 DIAGNOSIS — R69 Illness, unspecified: Secondary | ICD-10-CM | POA: Diagnosis not present

## 2022-04-02 ENCOUNTER — Other Ambulatory Visit: Payer: Self-pay | Admitting: Internal Medicine

## 2022-04-20 DIAGNOSIS — R69 Illness, unspecified: Secondary | ICD-10-CM | POA: Diagnosis not present

## 2022-04-20 DIAGNOSIS — F330 Major depressive disorder, recurrent, mild: Secondary | ICD-10-CM | POA: Diagnosis not present

## 2022-04-21 ENCOUNTER — Other Ambulatory Visit: Payer: Self-pay | Admitting: Nurse Practitioner

## 2022-04-21 DIAGNOSIS — M48062 Spinal stenosis, lumbar region with neurogenic claudication: Secondary | ICD-10-CM | POA: Diagnosis not present

## 2022-04-21 DIAGNOSIS — G4700 Insomnia, unspecified: Secondary | ICD-10-CM

## 2022-05-04 DIAGNOSIS — F330 Major depressive disorder, recurrent, mild: Secondary | ICD-10-CM | POA: Diagnosis not present

## 2022-05-04 DIAGNOSIS — R69 Illness, unspecified: Secondary | ICD-10-CM | POA: Diagnosis not present

## 2022-05-18 ENCOUNTER — Telehealth: Payer: Self-pay

## 2022-05-18 NOTE — Telephone Encounter (Signed)
Transition Care Management Unsuccessful Follow-up Telephone Call  Date of discharge and from where:  05/16/2022 [facility]   Attempts:  1st Attempt  Reason for unsuccessful TCM follow-up call:  unable to reach pt

## 2022-05-25 DIAGNOSIS — R69 Illness, unspecified: Secondary | ICD-10-CM | POA: Diagnosis not present

## 2022-05-26 DIAGNOSIS — M48062 Spinal stenosis, lumbar region with neurogenic claudication: Secondary | ICD-10-CM | POA: Diagnosis not present

## 2022-06-05 ENCOUNTER — Other Ambulatory Visit: Payer: Self-pay | Admitting: Internal Medicine

## 2022-06-08 DIAGNOSIS — R69 Illness, unspecified: Secondary | ICD-10-CM | POA: Diagnosis not present

## 2022-06-08 DIAGNOSIS — F330 Major depressive disorder, recurrent, mild: Secondary | ICD-10-CM | POA: Diagnosis not present

## 2022-06-26 DIAGNOSIS — M48062 Spinal stenosis, lumbar region with neurogenic claudication: Secondary | ICD-10-CM | POA: Diagnosis not present

## 2022-06-28 ENCOUNTER — Other Ambulatory Visit: Payer: Self-pay | Admitting: Nurse Practitioner

## 2022-06-28 DIAGNOSIS — G4700 Insomnia, unspecified: Secondary | ICD-10-CM

## 2022-06-29 DIAGNOSIS — F330 Major depressive disorder, recurrent, mild: Secondary | ICD-10-CM | POA: Diagnosis not present

## 2022-06-29 DIAGNOSIS — R69 Illness, unspecified: Secondary | ICD-10-CM | POA: Diagnosis not present

## 2022-07-13 DIAGNOSIS — R69 Illness, unspecified: Secondary | ICD-10-CM | POA: Diagnosis not present

## 2022-07-13 DIAGNOSIS — F330 Major depressive disorder, recurrent, mild: Secondary | ICD-10-CM | POA: Diagnosis not present

## 2022-07-22 ENCOUNTER — Ambulatory Visit (INDEPENDENT_AMBULATORY_CARE_PROVIDER_SITE_OTHER): Payer: Medicare HMO

## 2022-07-22 VITALS — BP 108/62 | HR 91 | Temp 98.1°F | Ht 66.0 in | Wt 157.2 lb

## 2022-07-22 DIAGNOSIS — Z0000 Encounter for general adult medical examination without abnormal findings: Secondary | ICD-10-CM | POA: Diagnosis not present

## 2022-07-22 NOTE — Patient Instructions (Signed)
Maria Hall , Thank you for taking time to come for your Medicare Wellness Visit. I appreciate your ongoing commitment to your health goals. Please review the following plan we discussed and let me know if I can assist you in the future.   These are the goals we discussed:  Goals       DIET - INCREASE WATER INTAKE (pt-stated)      DIET - INCREASE WATER INTAKE      06/21/2019, wants to still work on water intake. Also would like to work on exercising (strength training)      Patient Stated      07/04/2020, eat healthier      Patient Stated      06/25/2021, eat healthier      Patient Stated      07/22/2022, maintain current health        This is a list of the screening recommended for you and due dates:  Health Maintenance  Topic Date Due   COVID-19 Vaccine (7 - 2023-24 season) 05/29/2022   Colon Cancer Screening  08/10/2022   Medicare Annual Wellness Visit  07/23/2023   Mammogram  12/23/2023   DTaP/Tdap/Td vaccine (2 - Td or Tdap) 01/23/2024   Pneumonia Vaccine  Completed   Flu Shot  Completed   DEXA scan (bone density measurement)  Completed   Hepatitis C Screening: USPSTF Recommendation to screen - Ages 68-79 yo.  Completed   Zoster (Shingles) Vaccine  Completed   HPV Vaccine  Aged Out    Advanced directives: Please bring a copy of your POA (Power of [place]) and/or Living Will to your next appointment.   Conditions/risks identified: none  Next appointment: Follow up in one year for your annual wellness visit    Preventive Care 65 Years and Older, Female Preventive care refers to lifestyle choices and visits with your health care provider that can promote health and wellness. What does preventive care include? A yearly physical exam. This is also called an annual well check. Dental exams once or twice a year. Routine eye exams. Ask your health care provider how often you should have your eyes checked. Personal lifestyle choices, including: Daily care of your teeth  and gums. Regular physical activity. Eating a healthy diet. Avoiding tobacco and drug use. Limiting alcohol use. Practicing safe sex. Taking low-dose aspirin every day. Taking vitamin and mineral supplements as recommended by your health care provider. What happens during an annual well check? The services and screenings done by your health care provider during your annual well check will depend on your age, overall health, lifestyle risk factors, and family history of disease. Counseling  Your health care provider may ask you questions about your: Alcohol use. Tobacco use. Drug use. Emotional well-being. Home and relationship well-being. Sexual activity. Eating habits. History of falls. Memory and ability to understand (cognition). Work and work Statistician. Reproductive health. Screening  You may have the following tests or measurements: Height, weight, and BMI. Blood pressure. Lipid and cholesterol levels. These may be checked every 5 years, or more frequently if you are over 82 years old. Skin check. Lung cancer screening. You may have this screening every year starting at age 70 if you have a 30-pack-year history of smoking and currently smoke or have quit within the past 15 years. Fecal occult blood test (FOBT) of the stool. You may have this test every year starting at age 58. Flexible sigmoidoscopy or colonoscopy. You may have a sigmoidoscopy every 5 years or a  colonoscopy every 10 years starting at age 50. Hepatitis C blood test. Hepatitis B blood test. Sexually transmitted disease (STD) testing. Diabetes screening. This is done by checking your blood sugar (glucose) after you have not eaten for a while (fasting). You may have this done every 1-3 years. Bone density scan. This is done to screen for osteoporosis. You may have this done starting at age 66. Mammogram. This may be done every 1-2 years. Talk to your health care provider about how often you should have regular  mammograms. Talk with your health care provider about your test results, treatment options, and if necessary, the need for more tests. Vaccines  Your health care provider may recommend certain vaccines, such as: Influenza vaccine. This is recommended every year. Tetanus, diphtheria, and acellular pertussis (Tdap, Td) vaccine. You may need a Td booster every 10 years. Zoster vaccine. You may need this after age 23. Pneumococcal 13-valent conjugate (PCV13) vaccine. One dose is recommended after age 39. Pneumococcal polysaccharide (PPSV23) vaccine. One dose is recommended after age 53. Talk to your health care provider about which screenings and vaccines you need and how often you need them. This information is not intended to replace advice given to you by your health care provider. Make sure you discuss any questions you have with your health care provider. Document Released: 07/05/2015 Document Revised: 02/26/2016 Document Reviewed: 04/09/2015 Elsevier Interactive Patient Education  2017 [employer] Prevention in the Home Falls can cause injuries. They can happen to people of all ages. There are many things you can do to make your home safe and to help prevent falls. What can I do on the outside of my home? Regularly fix the edges of walkways and driveways and fix any cracks. Remove anything that might make you trip as you walk through a door, such as a raised step or threshold. Trim any bushes or trees on the path to your home. Use bright outdoor lighting. Clear any walking paths of anything that might make someone trip, such as rocks or tools. Regularly check to see if handrails are loose or broken. Make sure that both sides of any steps have handrails. Any raised decks and porches should have guardrails on the edges. Have any leaves, snow, or ice cleared regularly. Use sand or salt on walking paths during winter. Clean up any spills in your garage right away. This includes oil  or grease spills. What can I do in the bathroom? Use night lights. Install grab bars by the toilet and in the tub and shower. Do not use towel bars as grab bars. Use non-skid mats or decals in the tub or shower. If you need to sit down in the shower, use a plastic, non-slip stool. Keep the floor dry. Clean up any water that spills on the floor as soon as it happens. Remove soap buildup in the tub or shower regularly. Attach bath mats securely with double-sided non-slip rug tape. Do not have throw rugs and other things on the floor that can make you trip. What can I do in the bedroom? Use night lights. Make sure that you have a light by your bed that is easy to reach. Do not use any sheets or blankets that are too big for your bed. They should not hang down onto the floor. Have a firm chair that has side arms. You can use this for support while you get dressed. Do not have throw rugs and other things on the floor that can  make you trip. What can I do in the kitchen? Clean up any spills right away. Avoid walking on wet floors. Keep items that you use a lot in easy-to-reach places. If you need to reach something above you, use a strong step stool that has a grab bar. Keep electrical cords out of the way. Do not use floor polish or wax that makes floors slippery. If you must use wax, use non-skid floor wax. Do not have throw rugs and other things on the floor that can make you trip. What can I do with my stairs? Do not leave any items on the stairs. Make sure that there are handrails on both sides of the stairs and use them. Fix handrails that are broken or loose. Make sure that handrails are as long as the stairways. Check any carpeting to make sure that it is firmly attached to the stairs. Fix any carpet that is loose or worn. Avoid having throw rugs at the top or bottom of the stairs. If you do have throw rugs, attach them to the floor with carpet tape. Make sure that you have a light  switch at the top of the stairs and the bottom of the stairs. If you do not have them, ask someone to add them for you. What else can I do to help prevent falls? Wear shoes that: Do not have high heels. Have rubber bottoms. Are comfortable and fit you well. Are closed at the toe. Do not wear sandals. If you use a stepladder: Make sure that it is fully opened. Do not climb a closed stepladder. Make sure that both sides of the stepladder are locked into place. Ask someone to hold it for you, if possible. Clearly mark and make sure that you can see: Any grab bars or handrails. First and last steps. Where the edge of each step is. Use tools that help you move around (mobility aids) if they are needed. These include: Canes. Walkers. Scooters. Crutches. Turn on the lights when you go into a dark area. Replace any light bulbs as soon as they burn out. Set up your furniture so you have a clear path. Avoid moving your furniture around. If any of your floors are uneven, fix them. If there are any pets around you, be aware of where they are. Review your medicines with your doctor. Some medicines can make you feel dizzy. This can increase your chance of falling. Ask your doctor what other things that you can do to help prevent falls. This information is not intended to replace advice given to you by your health care provider. Make sure you discuss any questions you have with your health care provider. Document Released: 04/04/2009 Document Revised: 11/14/2015 Document Reviewed: 07/13/2014 Elsevier Interactive Patient Education  2017 [employer].

## 2022-07-22 NOTE — Progress Notes (Signed)
Subjective:   Maria Hall is a 72 y.o. female who presents for Medicare Annual (Subsequent) preventive examination.  Review of Systems     Cardiac Risk Factors include: advanced age (>54mn, >>60women);hypertension     Objective:    Today's Vitals   07/22/22 0956  BP: 108/62  Pulse: 91  Temp: 98.1 F (36.7 C)  TempSrc: Oral  SpO2: 98%  Weight: 157 lb 3.2 oz (71.3 kg)  Height: '5\' 6"'$  (1.676 m)   Body mass index is 25.37 kg/m.     07/22/2022   10:03 AM 06/25/2021    4:04 PM 05/12/2021   10:01 AM 03/24/2021    3:09 PM 07/04/2020    8:46 AM 06/21/2019   11:17 AM 06/09/2018    3:12 PM  Advanced Directives  Does Patient Have a Medical Advance Directive? Yes Yes Yes No Yes Yes Yes  Type of AParamedicof [place]aaLiving will [place]Living will Living will;Healthcare Power of [place] Living will;Healthcare Power of Attorney  Does patient want to make changes to medical advance directive?       No - Patient declined  Copy of HEast [place]in Chart? No - copy requested No - copy requested No - copy requested  No - copy requested No - copy requested No - copy requested  Would patient like information on creating a medical advance directive?    No - Patient declined       Current Medications (verified) Outpatient Encounter Medications as of 07/22/2022  Medication Sig   acetaminophen (TYLENOL) 650 MG CR tablet Take 650 mg by mouth. Take 2 caplets daily   Ascorbic Acid (VITAMIN C) 1000 MG tablet Take 1,000 mg by mouth daily.   aspirin EC 81 MG tablet Take 81 mg by mouth daily.   cetirizine (ZYRTEC) 10 MG tablet TAKE 1 TABLET BY MOUTH EVERY DAY   cholecalciferol (VITAMIN D) 1000 units tablet Take 1,000 Units by mouth daily.   Docusate Calcium (STOOL SOFTENER PO) Take 100 mg by mouth. Take 2 soft gels daily   folic acid (FOLVITE) 1 MG  tablet TAKE 1 TABLET BY MOUTH EVERY DAY   hydrochlorothiazide (HYDRODIURIL) 12.5 MG tablet TAKE 1 TABLET BY MOUTH EVERY DAY   HYDROcodone-acetaminophen (NORCO/VICODIN) 5-325 MG tablet Take 1 tablet by mouth every 6 (six) hours as needed for moderate pain.   levothyroxine (SYNTHROID) 88 MCG tablet TAKE 1 TABLET (88 MCG TOTAL) BY MOUTH DAILY BEFORE BREAKFAST. MON - SAT ONLY   meclizine (ANTIVERT) 12.5 MG tablet TAKE 1 TABLET BY MOUTH 3 TIMES DAILY AS NEEDED FOR DIZZINESS.   Omega 3-6-9 Fatty Acids (OMEGA 3-6-9 COMPLEX PO) Take 1,600 mg by mouth daily at 6 (six) AM.   vitamin E 400 UNIT capsule Take 400 Units by mouth daily.   No facility-administered encounter medications on file as of 07/22/2022.    Allergies (verified) Patient has no known allergies.   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   Bronchitis    gets every year in the winter   Insomnia    takes meds   Right rotator cuff tear    Swelling    in hands and ankles   Thyroid disease    Past Surgical History:  Procedure Laterality Date   ABDOMINAL HYSTERECTOMY     BUNIONECTOMY     bil feet  CESAREAN SECTION     CESAREAN SECTION     1 time   CHOLECYSTECTOMY     COLONOSCOPY     tummy tuck     Family History  Adopted: Yes   Social History   Socioeconomic History   Marital status: Married    Spouse name: Not on file   Number of children: Not on file   Years of education: Not on file   Highest education level: Not on file  Occupational History   Occupation: retired  Tobacco Use   Smoking status: Former    Packs/day: 1.00    Years: 20.00    Total pack years: 20.00    Types: Cigarettes    Quit date: 1990    Years since quitting: 34.1   Smokeless tobacco: Never  Vaping Use   Vaping Use: Never used  Substance and Sexual Activity   Alcohol use: Not Currently   Drug use: No   Sexual activity: Yes  Other Topics Concern   Not on file  Social History Narrative    Not on file   Social Determinants of Health   Financial Resource Strain: Low Risk  (07/22/2022)   Overall Financial Resource Strain (CARDIA)    Difficulty of Paying Living Expenses: Not hard at all  Food Insecurity: No Food Insecurity (07/22/2022)   Hunger Vital Sign    Worried About Running Out of Food in the Last Year: Never true    [place] in the Last Year: Never true  Transportation Needs: No Transportation Needs (07/22/2022)   PRAPARE - Hydrologist (Medical): No    Lack of Transportation (Non-Medical): No  Physical Activity: Insufficiently Active (07/22/2022)   Exercise Vital Sign    Days of Exercise per Week: 3 days    Minutes of Exercise per Session: 40 min  Stress: No Stress Concern Present (07/22/2022)   Elim    Feeling of Stress : Not at all  Social Connections: Not on file    Tobacco Counseling Counseling given: Not Answered   Clinical Intake:  Pre-visit preparation completed: Yes  Pain : No/denies pain     Nutritional Status: BMI 25 -29 Overweight Nutritional Risks: None Diabetes: No  How often do you need to have someone help you when you read instructions, pamphlets, or other written materials from your doctor or pharmacy?: 1 - Never  Diabetic? no  Interpreter Needed?: No  Information entered by :: NAllen LPN   Activities of Daily Living    07/22/2022   10:06 AM 07/18/2022    1:21 PM  In your present state of health, do you have any difficulty performing the following activities:  Hearing? 0 0  Vision? 0 0  Difficulty concentrating or making decisions? 0 0  Walking or climbing stairs? 0 0  Dressing or bathing? 0 0  Doing errands, shopping? 0 0  Preparing Food and eating ? N N  Using the Toilet? N N  In the past six months, have you accidently leaked urine? N N  Do you have problems with loss of bowel control? Tempie Donning  Comment happened twice    Managing your Medications? N N  Managing your Finances? N N  Housekeeping or managing your Housekeeping? N N    Patient Care Team: Glendale Chard, MD as PCP - General (Internal Medicine) Warden Fillers, MD as Consulting Physician (Ophthalmology)  Indicate any recent Medical Services you may have  received from other than [facility] providers in the past year (date may be approximate).     Assessment:   This is a routine wellness examination for Maria Hall.  Hearing/Vision screen Vision Screening - Comments:: Regular eye exams, [facility]  Dietary issues and exercise activities discussed: Current Exercise Habits: Home exercise routine, Type of exercise: strength training/weights, Time (Minutes): 40, Frequency (Times/Week): 3, Weekly Exercise (Minutes/Week): 120   Goals Addressed             This Visit's Progress    Patient Stated       07/22/2022, maintain current health       Depression Screen    07/22/2022   10:06 AM 06/25/2021    4:06 PM 07/04/2020    8:48 AM 09/07/2019    2:23 PM 06/21/2019   11:17 AM 05/01/2019   10:40 AM 03/28/2019   10:20 AM  PHQ 2/9 Scores  PHQ - 2 Score 0 0 0 0 0 0 0  PHQ- 9 Score     0      Fall Risk    07/22/2022   10:04 AM 07/18/2022    1:21 PM 06/25/2021    4:05 PM 07/04/2020    8:47 AM 09/07/2019    2:23 PM  Fall Risk   Falls in the past year? '1  1 1 '$ 0  Comment miss steps  loses balance and trips over things tripped, fell out of bed   Number falls in past yr: '1 1 1 1   '$ Injury with Fall? 0 1 0 0   Comment bruises and scrapes      Risk for fall due to : Medication side effect  Medication side effect Medication side effect   Follow up Falls prevention discussed;Education provided;Falls evaluation completed  Falls evaluation completed;Education provided;Falls prevention discussed Falls evaluation completed;Education provided;Falls prevention discussed     FALL RISK PREVENTION PERTAINING TO THE HOME:  Any stairs in or around the  home? Yes  If so, are there any without handrails? Yes  Home free of loose throw rugs in walkways, pet beds, electrical cords, etc? Yes  Adequate lighting in your home to reduce risk of falls? Yes   ASSISTIVE DEVICES UTILIZED TO PREVENT FALLS:  Life alert? No  Use of a cane, walker or w/c? No  Grab bars in the bathroom? No  Shower chair or bench in shower? No  Elevated toilet seat or a handicapped toilet? Yes   TIMED UP AND GO:  Was the test performed? Yes .  Length of time to ambulate 10 feet: 5 sec.   Gait steady and fast without use of assistive device  Cognitive Function:        07/22/2022   10:07 AM 06/25/2021    4:07 PM 07/04/2020    8:50 AM 06/21/2019   11:20 AM 06/09/2018    3:18 PM  6CIT Screen  What Year? 0 points 0 points 0 points 0 points 0 points  What month? 0 points 0 points 0 points 0 points 0 points  What time? 0 points 0 points 0 points 0 points 0 points  Count back from 20 0 points 0 points 0 points 0 points 0 points  Months in reverse 0 points 0 points 0 points 0 points 0 points  Repeat phrase 2 points 2 points 0 points 0 points 0 points  Total Score 2 points 2 points 0 points 0 points 0 points    Immunizations Immunization History  Administered Date(s) Administered  Fluad Quad(high Dose 65+) 03/03/2022   Influenza, High Dose Seasonal PF 04/08/2018, 03/01/2019   Influenza-Unspecified 04/11/2018, 03/03/2019, 04/02/2020, 04/12/2021, 03/03/2022   PFIZER Comirnaty(Gray Top)Covid-19 Tri-Sucrose Vaccine 04/03/2022   PFIZER(Purple Top)SARS-COV-2 Vaccination 07/28/2019, 08/18/2019, 03/18/2020, 10/01/2020   Pfizer Covid-19 Vaccine Bivalent Booster 20yr & up 03/31/2021, 04/03/2022   Pneumococcal Conjugate-13 03/10/2016   Pneumococcal Polysaccharide-23 07/12/2014   Respiratory Syncytial Virus Vaccine,Recomb Aduvanted(Arexvy) 03/03/2022   Tdap 01/22/2014   Zoster Recombinat (Shingrix) 04/04/2017, 07/27/2017   Zoster, Live 03/09/2016    TDAP status: Up to  date  Flu Vaccine status: Up to date  Pneumococcal vaccine status: Up to date  Covid-19 vaccine status: Completed vaccines  Qualifies for Shingles Vaccine? Yes   Zostavax completed Yes   Shingrix Completed?: Yes  Screening Tests Health Maintenance  Topic Date Due   COVID-19 Vaccine (7 - 2023-24 season) 05/29/2022   COLONOSCOPY (Pts 45-457yrInsurance coverage will need to be confirmed)  08/10/2022   Medicare Annual Wellness (AWV)  07/23/2023   MAMMOGRAM  12/23/2023   DTaP/Tdap/Td (2 - Td or Tdap) 01/23/2024   Pneumonia Vaccine 6544Years old  Completed   INFLUENZA VACCINE  Completed   DEXA SCAN  Completed   Hepatitis C Screening  Completed   Zoster Vaccines- Shingrix  Completed   HPV VACCINES  Aged Out    Health Maintenance  Health Maintenance Due  Topic Date Due   COVID-19 Vaccine (7 - 2023-24 season) 05/29/2022    Colorectal cancer screening: Type of screening: Colonoscopy. Completed 08/10/2017. Repeat every 10 years  Mammogram status: Completed 12/22/2021. Repeat every year  Bone Density status: Completed 05/26/2016.   Lung Cancer Screening: (Low Dose CT Chest recommended if Age 74-80ears, 30 pack-year currently smoking OR have quit w/in 15years.) does not qualify.   Lung Cancer Screening Referral: no  Additional Screening:  Hepatitis C Screening: does qualify; Completed 06/09/2018  Vision Screening: Recommended annual ophthalmology exams for early detection of glaucoma and other disorders of the eye. Is the patient up to date with their annual eye exam?  Yes  Who is the provider or what is the name of the office in which the patient attends annual eye exams? [facility] If pt is not established with a provider, would they like to be referred to a provider to establish care? No .   Dental Screening: Recommended annual dental exams for proper oral hygiene  Community Resource Referral / Chronic Care Management: CRR required this visit?  No   CCM  required this visit?  No      Plan:     I have personally reviewed and noted the following in the patient's chart:   Medical and social history Use of alcohol, tobacco or illicit drugs  Current medications and supplements including opioid prescriptions. Patient is not currently taking opioid prescriptions. Functional ability and status Nutritional status Physical activity Advanced directives List of other physicians Hospitalizations, surgeries, and ER visits in previous 12 months Vitals Screenings to include cognitive, depression, and falls Referrals and appointments  In addition, I have reviewed and discussed with patient certain preventive protocols, quality metrics, and best practice recommendations. A written personalized care plan for preventive services as well as general preventive health recommendations were provided to patient.     NiKellie SimmeringLPN   06/23/59/0960 Nurse Notes: none

## 2022-07-26 ENCOUNTER — Other Ambulatory Visit: Payer: Self-pay | Admitting: Nurse Practitioner

## 2022-07-26 DIAGNOSIS — R110 Nausea: Secondary | ICD-10-CM

## 2022-07-26 DIAGNOSIS — R42 Dizziness and giddiness: Secondary | ICD-10-CM

## 2022-07-27 ENCOUNTER — Other Ambulatory Visit: Payer: Self-pay

## 2022-07-27 DIAGNOSIS — R69 Illness, unspecified: Secondary | ICD-10-CM | POA: Diagnosis not present

## 2022-07-27 DIAGNOSIS — R42 Dizziness and giddiness: Secondary | ICD-10-CM

## 2022-07-27 DIAGNOSIS — F330 Major depressive disorder, recurrent, mild: Secondary | ICD-10-CM | POA: Diagnosis not present

## 2022-07-27 DIAGNOSIS — R110 Nausea: Secondary | ICD-10-CM

## 2022-07-27 LAB — HM DIABETES EYE EXAM

## 2022-07-27 MED ORDER — MECLIZINE HCL 12.5 MG PO TABS: 12.5000 mg | ORAL_TABLET | Freq: Three times a day (TID) | ORAL | 0 refills | Status: DC | PRN

## 2022-08-04 ENCOUNTER — Encounter: Payer: Self-pay | Admitting: Gastroenterology

## 2022-08-10 DIAGNOSIS — R69 Illness, unspecified: Secondary | ICD-10-CM | POA: Diagnosis not present

## 2022-08-10 DIAGNOSIS — F330 Major depressive disorder, recurrent, mild: Secondary | ICD-10-CM | POA: Diagnosis not present

## 2022-08-13 DIAGNOSIS — M48062 Spinal stenosis, lumbar region with neurogenic claudication: Secondary | ICD-10-CM | POA: Diagnosis not present

## 2022-09-02 ENCOUNTER — Other Ambulatory Visit: Payer: Self-pay | Admitting: Internal Medicine

## 2022-09-02 MED ORDER — NIRMATRELVIR/RITONAVIR (PAXLOVID) TABLET (RENAL DOSING): 2.0000 | ORAL_TABLET | Freq: Two times a day (BID) | ORAL | 0 refills | Status: AC

## 2022-09-07 DIAGNOSIS — F330 Major depressive disorder, recurrent, mild: Secondary | ICD-10-CM | POA: Diagnosis not present

## 2022-09-07 DIAGNOSIS — R69 Illness, unspecified: Secondary | ICD-10-CM | POA: Diagnosis not present

## 2022-09-08 DIAGNOSIS — M48062 Spinal stenosis, lumbar region with neurogenic claudication: Secondary | ICD-10-CM | POA: Diagnosis not present

## 2022-09-16 DIAGNOSIS — M9901 Segmental and somatic dysfunction of cervical region: Secondary | ICD-10-CM | POA: Diagnosis not present

## 2022-09-16 DIAGNOSIS — M9904 Segmental and somatic dysfunction of sacral region: Secondary | ICD-10-CM | POA: Diagnosis not present

## 2022-09-16 DIAGNOSIS — M9902 Segmental and somatic dysfunction of thoracic region: Secondary | ICD-10-CM | POA: Diagnosis not present

## 2022-09-16 DIAGNOSIS — M9903 Segmental and somatic dysfunction of lumbar region: Secondary | ICD-10-CM | POA: Diagnosis not present

## 2022-09-21 DIAGNOSIS — F330 Major depressive disorder, recurrent, mild: Secondary | ICD-10-CM | POA: Diagnosis not present

## 2022-09-21 DIAGNOSIS — R69 Illness, unspecified: Secondary | ICD-10-CM | POA: Diagnosis not present

## 2022-09-22 DIAGNOSIS — M9904 Segmental and somatic dysfunction of sacral region: Secondary | ICD-10-CM | POA: Diagnosis not present

## 2022-09-22 DIAGNOSIS — M9903 Segmental and somatic dysfunction of lumbar region: Secondary | ICD-10-CM | POA: Diagnosis not present

## 2022-09-22 DIAGNOSIS — M9902 Segmental and somatic dysfunction of thoracic region: Secondary | ICD-10-CM | POA: Diagnosis not present

## 2022-09-22 DIAGNOSIS — M9901 Segmental and somatic dysfunction of cervical region: Secondary | ICD-10-CM | POA: Diagnosis not present

## 2022-09-24 ENCOUNTER — Ambulatory Visit (INDEPENDENT_AMBULATORY_CARE_PROVIDER_SITE_OTHER): Payer: Medicare HMO | Admitting: Internal Medicine

## 2022-09-24 VITALS — BP 118/76 | HR 60 | Temp 97.7°F | Ht 66.0 in | Wt 157.2 lb

## 2022-09-24 DIAGNOSIS — I10 Essential (primary) hypertension: Secondary | ICD-10-CM

## 2022-09-24 DIAGNOSIS — E039 Hypothyroidism, unspecified: Secondary | ICD-10-CM | POA: Diagnosis not present

## 2022-09-24 DIAGNOSIS — H6121 Impacted cerumen, right ear: Secondary | ICD-10-CM

## 2022-09-24 DIAGNOSIS — Z1211 Encounter for screening for malignant neoplasm of colon: Secondary | ICD-10-CM | POA: Diagnosis not present

## 2022-09-24 DIAGNOSIS — D573 Sickle-cell trait: Secondary | ICD-10-CM | POA: Diagnosis not present

## 2022-09-24 DIAGNOSIS — Z1212 Encounter for screening for malignant neoplasm of rectum: Secondary | ICD-10-CM

## 2022-09-24 DIAGNOSIS — R7309 Other abnormal glucose: Secondary | ICD-10-CM | POA: Diagnosis not present

## 2022-09-24 DIAGNOSIS — D75839 Thrombocytosis, unspecified: Secondary | ICD-10-CM | POA: Insufficient documentation

## 2022-09-24 DIAGNOSIS — R42 Dizziness and giddiness: Secondary | ICD-10-CM

## 2022-09-24 DIAGNOSIS — D473 Essential (hemorrhagic) thrombocythemia: Secondary | ICD-10-CM | POA: Diagnosis not present

## 2022-09-24 NOTE — Progress Notes (Addendum)
I,[name],acting as a scribe for Maximino Greenland, MD.,have documented all relevant documentation on the behalf of Maximino Greenland, MD,as directed by  Maximino Greenland, MD while in the presence of Maximino Greenland, MD.    Subjective:     Patient ID: Maria Hall , female    DOB: October 15, 1950 , 72 y.o.   MRN: [id number]   Chief Complaint  Patient presents with   Dizziness   Hypertension   Hypothyroidism    HPI  Patient presents today for vertigo f/u. She states she developed dizziness after a cold about 2-3 weeks ago. She was prescribed Meclizine at this visit, her sx have since improved. Denies headache, chest pain, and SOB.        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 Take 2 caplets daily, Disp: , Rfl:    Ascorbic Acid (VITAMIN C) 1000 MG tablet, Take 1,000 mg by mouth daily., Disp: , Rfl:    aspirin EC 81 MG tablet, Take 81 mg by mouth daily., Disp: , Rfl:    cetirizine (ZYRTEC) 10 MG tablet, TAKE 1 TABLET BY MOUTH EVERY DAY, Disp: 90 tablet, Rfl: 2   cholecalciferol (VITAMIN D) 1000 units tablet, Take 1,000 Units by mouth daily., Disp: , Rfl:    Docusate Calcium (STOOL SOFTENER PO), Take 100 mg by mouth. Take 2 soft gels daily, Disp: , Rfl:    folic acid (FOLVITE) 1 MG tablet, TAKE 1 TABLET BY MOUTH EVERY DAY, Disp: 90 tablet, Rfl: 1   hydrochlorothiazide (HYDRODIURIL) 12.5 MG tablet, TAKE 1 TABLET BY MOUTH EVERY DAY, Disp: 90 tablet, Rfl: 1   levothyroxine (SYNTHROID) 88 MCG tablet, TAKE 1 TABLET (88 MCG TOTAL) BY MOUTH DAILY BEFORE BREAKFAST. MON - SAT ONLY, Disp: 78 tablet, Rfl: 1   meclizine (ANTIVERT) 12.5 MG tablet, Take 1  tablet (12.5 mg total) by mouth 3 (three) times daily as needed for dizziness., Disp: 90 tablet, Rfl: 0   Omega 3-6-9 Fatty Acids (OMEGA 3-6-9 COMPLEX PO), Take 1,600 mg by mouth daily at 6 (six) AM., Disp: , Rfl:    vitamin E 400 UNIT capsule, Take 400 Units by mouth daily., Disp: , Rfl:    No Known Allergies   Review of Systems  Constitutional: Negative.   Respiratory: Negative.    Cardiovascular: Negative.   Gastrointestinal: Negative.   Musculoskeletal: Negative.   Skin: Negative.   Neurological: Negative.   Psychiatric/Behavioral: Negative.       Today's Vitals   09/24/22 1419  BP: 118/76  Pulse: 60  Temp: 97.7 F (36.5 C)  SpO2: 98%  Weight: 157 lb 3.2 oz (71.3 kg)  Height: 5\' 6"  (1.676 m)   Body mass index is 25.37 kg/m.  Wt Readings from Last 3 Encounters:  09/24/22 157 lb 3.2 oz (71.3 kg)  07/22/22 157 lb 3.2 oz (71.3 kg)  11/10/21 159 lb (72.1 kg)    Objective:  Physical Exam Vitals and nursing note reviewed.  Constitutional:      Appearance: Normal appearance.  HENT:     Head: Normocephalic and atraumatic.     Right Ear: Ear canal and external ear normal. There is impacted cerumen.     Left Ear: Tympanic membrane, ear canal and external ear normal. There is no impacted cerumen.     Nose:     Comments: Masked     Mouth/Throat:     Comments: Masked  Eyes:     Extraocular Movements: Extraocular movements intact.  Cardiovascular:     Rate and Rhythm: Normal rate and regular rhythm.     Heart sounds: Normal heart sounds.  Pulmonary:     Effort: Pulmonary effort is normal.     Breath sounds: Normal breath sounds.  Musculoskeletal:     Cervical back: Normal range of motion.  Skin:    General: Skin is warm.  Neurological:     General: No focal deficit present.     Mental Status: She is alert.  Psychiatric:        Mood and Affect: Mood normal.        Behavior: Behavior normal.         Assessment And Plan:     1. Vertigo Comments: Improved. She  is advised to take meclizine prn. Encouraged to stay well hydrated.  2. Essential hypertension, benign Comments: Chronic, well controlled.  She will c/w HCTZ 12.25mg  daily. She is encouraged to follow a low sodium diet. F/u 3-6 months. - CMP14+EGFR - CBC  3. Primary hypothyroidism Comments: Chronic, I will check a thyroid panel and adjust meds as needed.  For now, she will c/w levothyroxine 64mcg M-Sat dosing. She will f/u in 6 months. - TSH + free T4  4. Other abnormal glucose Comments: Previous labs reviewed, a1c has been elevated in the past. I will recheck an a1c today. She is encouraged to limit her intake of sugary beverages and foods. - Hemoglobin A1c  5. Essential (hemorrhagic) thrombocythemia Comments: Chronic, she has been evaluated by Hematology in the past .I will recheck CBC today.  6. Right ear impacted cerumen Comments: After obtaining verbal consent, R ear was flushed without complication. No TM abnormalities were noted. - Ear Lavage  7. Sickle-cell trait Comments: Chronic, this has been stable. She is encouraged to stay well hydrated.  8. Screening for colorectal cancer Comments: Previous colonoscopy in 2019 was positive for polyps. I will refer her to GI for CRC screening. - Ambulatory referral to Gastroenterology   Patient was given opportunity to ask questions. Patient verbalized understanding of the plan and was able to repeat key elements of the plan. All questions were answered to their satisfaction.   I, Maximino Greenland, MD, have reviewed all documentation for this visit. The documentation on 09/24/22 for the exam, diagnosis, procedures, and orders are all accurate and complete.   IF YOU HAVE BEEN REFERRED TO A SPECIALIST, IT MAY TAKE 1-2 WEEKS TO SCHEDULE/PROCESS THE REFERRAL. IF YOU HAVE NOT HEARD FROM US/SPECIALIST IN TWO WEEKS, PLEASE GIVE [place] A CALL AT [phone] X [phone].   THE PATIENT IS ENCOURAGED TO PRACTICE SOCIAL DISTANCING DUE TO THE COVID-19  PANDEMIC.

## 2022-09-24 NOTE — Addendum Note (Signed)
Addended by: Maximino Greenland on: 09/24/2022 10:55 PM   Modules accepted: Orders

## 2022-09-24 NOTE — Patient Instructions (Signed)
Vertigo Vertigo is the feeling that you or the things around you are moving when they are not. This feeling can come and go at any time. Vertigo often goes away on its own. This condition can be dangerous if it happens when you are doing activities like driving or working with machines. Your doctor will do tests to find the cause of your vertigo. These tests will also help your doctor decide on the best treatment for you. Follow these instructions at home: Eating and drinking     Drink enough fluid to keep your pee (urine) pale yellow. Do not drink alcohol. Activity Return to your normal activities when your doctor says that it is safe. In the morning, first sit up on the side of the bed. When you feel okay, stand slowly while you hold onto something until you know that your balance is fine. Move slowly. Avoid sudden body or head movements or certain positions, as told by your doctor. Use a cane if you have trouble standing or walking. Sit down right away if you feel dizzy. Avoid doing any tasks or activities that can cause danger to you or others if you get dizzy. Avoid bending down if you feel dizzy. Place items in your home so that they are easy for you to reach without bending or leaning over. Do not drive or use machinery if you feel dizzy. General instructions Take over-the-counter and prescription medicines only as told by your doctor. Keep all follow-up visits. Contact a doctor if: Your medicine does not help your vertigo. Your problems get worse or you have new symptoms. You have a fever. You feel like you may vomit (nauseous), or this feeling gets worse. You start to vomit. Your family or friends see changes in how you act. You lose feeling (have numbness) in part of your body. You feel prickling and tingling in a part of your body. Get help right away if: You are always dizzy. You faint. You get very bad headaches. You get a stiff neck. Bright light starts to bother  you. You have trouble moving or talking. You feel weak in your hands, arms, or legs. You have changes in your hearing or in how you see (vision). These symptoms may be an emergency. Get help right away. Call your local emergency services (911 in the [place] Do not wait to see if the symptoms will go away. Do not drive yourself to the hospital. Summary Vertigo is the feeling that you or the things around you are moving when they are not. Your doctor will do tests to find the cause of your vertigo. You may be told to avoid some tasks, positions, or movements. Contact a doctor if your medicine is not helping, or if you have a fever, new symptoms, or a change in how you act. Get help right away if you get very bad headaches, or if you have changes in how you speak, hear, or see. This information is not intended to replace advice given to you by your health care provider. Make sure you discuss any questions you have with your health care provider. Document Revised: 05/08/2020 Document Reviewed: 05/08/2020 Elsevier Patient Education  2023 [employer]  

## 2022-09-25 LAB — CBC
Hematocrit: 37.6 % (ref 34.0–46.6)
Hemoglobin: 12.5 g/dL (ref 11.1–15.9)
MCH: 28.9 pg (ref 26.6–33.0)
MCHC: 33.2 g/dL (ref 31.5–35.7)
MCV: 87 fL (ref 79–97)
Platelets: 530 10*3/uL — ABNORMAL HIGH (ref 150–450)
RBC: 4.32 x10E6/uL (ref 3.77–5.28)
RDW: 13.4 % (ref 11.7–15.4)
WBC: 6.9 10*3/uL (ref 3.4–10.8)

## 2022-09-25 LAB — CMP14+EGFR
ALT: 11 IU/L (ref 0–32)
AST: 19 IU/L (ref 0–40)
Albumin/Globulin Ratio: 1.7 (ref 1.2–2.2)
Albumin: 3.8 g/dL (ref 3.8–4.8)
Alkaline Phosphatase: 117 IU/L (ref 44–121)
BUN/Creatinine Ratio: 12 (ref 12–28)
BUN: 10 mg/dL (ref 8–27)
Bilirubin Total: 0.2 mg/dL (ref 0.0–1.2)
CO2: 29 mmol/L (ref 20–29)
Calcium: 9.8 mg/dL (ref 8.7–10.3)
Chloride: 101 mmol/L (ref 96–106)
Creatinine, Ser: 0.85 mg/dL (ref 0.57–1.00)
Globulin, Total: 2.3 g/dL (ref 1.5–4.5)
Glucose: 83 mg/dL (ref 70–99)
Potassium: 4.3 mmol/L (ref 3.5–5.2)
Sodium: 142 mmol/L (ref 134–144)
Total Protein: 6.1 g/dL (ref 6.0–8.5)
eGFR: 72 mL/min/{1.73_m2} (ref >59)

## 2022-09-25 LAB — TSH+FREE T4
Free T4: 1.14 ng/dL (ref 0.82–1.77)
TSH: 0.549 u[IU]/mL (ref 0.450–4.500)

## 2022-09-25 LAB — HEMOGLOBIN A1C
Est. average glucose Bld gHb Est-mCnc: 126 mg/dL
Hgb A1c MFr Bld: 6 % — ABNORMAL HIGH (ref 4.8–5.6)

## 2022-09-27 ENCOUNTER — Other Ambulatory Visit: Payer: Self-pay | Admitting: Nurse Practitioner

## 2022-09-27 ENCOUNTER — Other Ambulatory Visit: Payer: Self-pay | Admitting: Internal Medicine

## 2022-10-06 DIAGNOSIS — M9904 Segmental and somatic dysfunction of sacral region: Secondary | ICD-10-CM | POA: Diagnosis not present

## 2022-10-06 DIAGNOSIS — M9902 Segmental and somatic dysfunction of thoracic region: Secondary | ICD-10-CM | POA: Diagnosis not present

## 2022-10-06 DIAGNOSIS — M9903 Segmental and somatic dysfunction of lumbar region: Secondary | ICD-10-CM | POA: Diagnosis not present

## 2022-10-06 DIAGNOSIS — M9901 Segmental and somatic dysfunction of cervical region: Secondary | ICD-10-CM | POA: Diagnosis not present

## 2022-10-09 ENCOUNTER — Other Ambulatory Visit: Payer: Self-pay | Admitting: Nurse Practitioner

## 2022-10-09 DIAGNOSIS — R110 Nausea: Secondary | ICD-10-CM

## 2022-10-09 DIAGNOSIS — R42 Dizziness and giddiness: Secondary | ICD-10-CM

## 2022-10-19 DIAGNOSIS — F330 Major depressive disorder, recurrent, mild: Secondary | ICD-10-CM | POA: Diagnosis not present

## 2022-11-02 DIAGNOSIS — F330 Major depressive disorder, recurrent, mild: Secondary | ICD-10-CM | POA: Diagnosis not present

## 2022-11-03 DIAGNOSIS — M9902 Segmental and somatic dysfunction of thoracic region: Secondary | ICD-10-CM | POA: Diagnosis not present

## 2022-11-03 DIAGNOSIS — M9901 Segmental and somatic dysfunction of cervical region: Secondary | ICD-10-CM | POA: Diagnosis not present

## 2022-11-03 DIAGNOSIS — M9904 Segmental and somatic dysfunction of sacral region: Secondary | ICD-10-CM | POA: Diagnosis not present

## 2022-11-03 DIAGNOSIS — M9903 Segmental and somatic dysfunction of lumbar region: Secondary | ICD-10-CM | POA: Diagnosis not present

## 2022-11-17 ENCOUNTER — Encounter: Payer: Self-pay | Admitting: Internal Medicine

## 2022-11-17 ENCOUNTER — Encounter: Payer: Medicare HMO | Admitting: Internal Medicine

## 2022-11-24 ENCOUNTER — Ambulatory Visit (INDEPENDENT_AMBULATORY_CARE_PROVIDER_SITE_OTHER): Payer: Medicare HMO | Admitting: Internal Medicine

## 2022-11-24 ENCOUNTER — Encounter: Payer: Self-pay | Admitting: Internal Medicine

## 2022-11-24 ENCOUNTER — Other Ambulatory Visit: Payer: Self-pay | Admitting: Nurse Practitioner

## 2022-11-24 ENCOUNTER — Other Ambulatory Visit: Payer: Self-pay | Admitting: Internal Medicine

## 2022-11-24 VITALS — BP 106/74 | HR 71 | Temp 97.9°F | Ht 66.0 in | Wt 157.2 lb

## 2022-11-24 DIAGNOSIS — Z6825 Body mass index (BMI) 25.0-25.9, adult: Secondary | ICD-10-CM | POA: Diagnosis not present

## 2022-11-24 DIAGNOSIS — D573 Sickle-cell trait: Secondary | ICD-10-CM | POA: Diagnosis not present

## 2022-11-24 DIAGNOSIS — I10 Essential (primary) hypertension: Secondary | ICD-10-CM

## 2022-11-24 DIAGNOSIS — R7309 Other abnormal glucose: Secondary | ICD-10-CM

## 2022-11-24 DIAGNOSIS — Z0001 Encounter for general adult medical examination with abnormal findings: Secondary | ICD-10-CM | POA: Diagnosis not present

## 2022-11-24 DIAGNOSIS — M79641 Pain in right hand: Secondary | ICD-10-CM

## 2022-11-24 DIAGNOSIS — R110 Nausea: Secondary | ICD-10-CM

## 2022-11-24 DIAGNOSIS — E039 Hypothyroidism, unspecified: Secondary | ICD-10-CM | POA: Diagnosis not present

## 2022-11-24 DIAGNOSIS — Z0000 Encounter for general adult medical examination without abnormal findings: Secondary | ICD-10-CM

## 2022-11-24 DIAGNOSIS — D473 Essential (hemorrhagic) thrombocythemia: Secondary | ICD-10-CM

## 2022-11-24 DIAGNOSIS — R42 Dizziness and giddiness: Secondary | ICD-10-CM

## 2022-11-24 NOTE — Progress Notes (Signed)
I,Maria Hall,acting as a scribe for Maria Aliment, MD.,have documented all relevant documentation on the behalf of Maria Aliment, MD,as directed by  Maria Aliment, MD while in the presence of Maria Aliment, MD.   Subjective:     Patient ID: Maria Hall , female    DOB: 03-Jan-1951 , 72 y.o.   MRN: [id number]   Chief Complaint  Patient presents with   Annual Exam   Hypertension   Hypothyroidism    HPI  Patient presents today for a physical. Patient reports compliance with her meds. She denies headaches, chest pain and shortness of breath. She currently does not have an GYN. She has no specific concerns or complaints at this time.      Hypertension This is a chronic problem. The current episode started more than 1 year ago. The problem has been gradually improving since onset. The problem is controlled. Pertinent negatives include no blurred vision, palpitations or peripheral edema. Risk factors for coronary artery disease include post-menopausal state. Past treatments include diuretics. The current treatment provides moderate improvement. There are no compliance problems.  Identifiable causes of hypertension include a thyroid problem.  Thyroid Problem Presents for follow-up visit. Patient reports no depressed mood, diaphoresis, menstrual problem, palpitations or tremors. The symptoms have been stable.     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 Take 2 caplets daily, Disp: , Rfl:    Ascorbic Acid (VITAMIN C) 1000 MG tablet, Take 1,000 mg by mouth daily., Disp: , Rfl:    aspirin EC 81 MG tablet, Take 81 mg by mouth daily., Disp: ,  Rfl:    cetirizine (ZYRTEC) 10 MG tablet, TAKE 1 TABLET BY MOUTH EVERY DAY, Disp: 90 tablet, Rfl: 2   cholecalciferol (VITAMIN D) 1000 units tablet, Take 1,000 Units by mouth daily., Disp: , Rfl:    Docusate Calcium (STOOL SOFTENER PO), Take 100 mg by mouth. Take 2 soft gels daily, Disp: , Rfl:    folic acid (FOLVITE) 1 MG tablet, TAKE 1 TABLET BY MOUTH EVERY DAY, Disp: 90 tablet, Rfl: 1   hydrochlorothiazide (HYDRODIURIL) 12.5 MG tablet, TAKE 1 TABLET BY MOUTH EVERY DAY, Disp: 90 tablet, Rfl: 1   levothyroxine (SYNTHROID) 88 MCG tablet, TAKE 1 TABLET (88 MCG TOTAL) BY MOUTH DAILY BEFORE BREAKFAST. MON - SAT ONLY, Disp: 78 tablet, Rfl: 1   Omega 3-6-9 Fatty Acids (OMEGA 3-6-9 COMPLEX PO), Take 1,600 mg by mouth daily at 6 (six) AM., Disp: , Rfl:    vitamin E 400 UNIT capsule, Take 400 Units by mouth daily., Disp: , Rfl:    meclizine (ANTIVERT) 12.5 MG tablet, Take 1 tablet (12.5 mg total) by mouth 3 (three) times daily as needed for dizziness., Disp: 90 tablet, Rfl: 0   traZODone (DESYREL) 100 MG tablet, One tab po qpm, Disp: 90 tablet, Rfl: 1   No Known Allergies    The patient states she uses post menopausal status for birth control. Last LMP was No LMP recorded. Patient has had a hysterectomy.Marland Kitchen  Negative for Dysmenorrhea. Negative for: breast discharge, breast lump(s), breast pain and breast self exam. Associated symptoms include abnormal vaginal bleeding. Pertinent negatives include abnormal bleeding (hematology), anxiety, decreased libido, depression, difficulty falling sleep, dyspareunia, history of infertility, nocturia, sexual dysfunction, sleep disturbances, urinary incontinence, urinary urgency, vaginal discharge and vaginal itching. Diet regular.The patient states her exercise level is  moderate, regular.  . The patient's tobacco use is:  Social History   Tobacco Use  Smoking Status Former   Packs/day: 1.00   Years: 20.00   Additional pack years: 0.00   Total pack years: 20.00    Types: Cigarettes   Quit date: 1990   Years since quitting: 34.4  Smokeless Tobacco Never  . She has been exposed to passive smoke. The patient's alcohol use is:  Social History   Substance and Sexual Activity  Alcohol Use Not Currently    Review of Systems  Constitutional: Negative.  Negative for diaphoresis.  HENT: Negative.    Eyes: Negative.  Negative for blurred vision.  Respiratory: Negative.    Cardiovascular: Negative.  Negative for palpitations.  Gastrointestinal: Negative.   Endocrine: Negative.   Genitourinary: Negative.  Negative for menstrual problem.  Musculoskeletal:  Positive for arthralgias.       She c/o right hand pain. She also c/o knuckle swelling. Pain starts at top of hand and radiates upward towards her elbow. Described as a stabbing pain. Denies fall/trauma. She is not sure what triggered her pain. Sx started about three months ago.   Skin: Negative.   Allergic/Immunologic: Negative.   Neurological: Negative.  Negative for tremors.  Hematological: Negative.   Psychiatric/Behavioral: Negative.       Today's Vitals   11/24/22 0933  BP: 106/74  Pulse: 71  Temp: 97.9 F (36.6 C)  SpO2: 98%  Weight: 157 lb 3.2 oz (71.3 kg)  Height: 5\' 6"  (1.676 m)   Body mass index is 25.37 kg/m.  Wt Readings from Last 3 Encounters:  11/24/22 157 lb 3.2 oz (71.3 kg)  09/24/22 157 lb 3.2 oz (71.3 kg)  07/22/22 157 lb 3.2 oz (71.3 kg)    Objective:  Physical Exam Vitals and nursing note reviewed.  Constitutional:      Appearance: Normal appearance.  HENT:     Head: Normocephalic and atraumatic.     Right Ear: Tympanic membrane, ear canal and external ear normal.     Left Ear: Tympanic membrane, ear canal and external ear normal.     Nose: Nose normal.     Mouth/Throat:     Mouth: Mucous membranes are moist.     Pharynx: Oropharynx is clear.  Eyes:     Extraocular Movements: Extraocular movements intact.     Conjunctiva/sclera: Conjunctivae normal.      Pupils: Pupils are equal, round, and reactive to light.  Cardiovascular:     Rate and Rhythm: Normal rate and regular rhythm.     Pulses: Normal pulses.     Heart sounds: Normal heart sounds.  Pulmonary:     Effort: Pulmonary effort is normal.     Breath sounds: Normal breath sounds.  Chest:  Breasts:    Tanner Score is 5.     Right: Normal.     Left: Normal.  Abdominal:     General: Abdomen is flat. Bowel sounds are normal.     Palpations: Abdomen is soft.  Genitourinary:    Comments: deferred Musculoskeletal:        General: Normal range of motion.  Cervical back: Normal range of motion and neck supple.     Comments: R MCP swelling, pos squeeze test  Skin:    General: Skin is warm and dry.  Neurological:     General: No focal deficit present.     Mental Status: She is alert and oriented to person, place, and time.  Psychiatric:        Mood and Affect: Mood normal.        Behavior: Behavior normal.      Assessment And Plan:     1. Encounter for general adult medical examination w/o abnormal findings Comments: A full exam was performed. Importance of monthly self breast exams was discussed with the patient.  PATIENT IS ADVISED TO GET 30-45 MINUTES REGULAR EXERCISE NO LESS THAN FOUR TO FIVE DAYS PER WEEK - BOTH WEIGHTBEARING EXERCISES AND AEROBIC ARE RECOMMENDED.  PATIENT IS ADVISED TO FOLLOW A HEALTHY DIET WITH AT LEAST SIX FRUITS/VEGGIES PER DAY, DECREASE INTAKE OF RED MEAT, AND TO INCREASE FISH INTAKE TO TWO DAYS PER WEEK.  MEATS/FISH SHOULD NOT BE FRIED, BAKED OR BROILED IS PREFERABLE.  IT IS ALSO IMPORTANT TO CUT BACK ON YOUR SUGAR INTAKE. PLEASE AVOID ANYTHING WITH ADDED SUGAR, CORN SYRUP OR OTHER SWEETENERS. IF YOU MUST USE A SWEETENER, YOU CAN TRY STEVIA. IT IS ALSO IMPORTANT TO AVOID ARTIFICIALLY SWEETENERS AND DIET BEVERAGES. LASTLY, I SUGGEST WEARING SPF 50 SUNSCREEN ON EXPOSED PARTS AND ESPECIALLY WHEN IN THE DIRECT SUNLIGHT FOR AN EXTENDED PERIOD OF TIME.  PLEASE  AVOID FAST FOOD RESTAURANTS AND INCREASE YOUR WATER INTAKE.   2. Essential hypertension, benign Comments: Chronic, controlled. EKG performed, NSR w/o acute changes. She will c/w HCTZ 12.5mg  daily. Advised to follow low sodium diet.  She will f/u in six months for re-evaluation.  - Microalbumin / Creatinine Urine Ratio - EKG 12-Lead - Lipid panel - BMP8+eGFR  3. Primary hypothyroidism - TSH + free T4; Future  4. Right hand pain Comments: Positive squeeze test. I will check an arthritis panel.  Advised to apply Voltaren gel to affected area twice daily prn. - Lipid panel - ANA, IFA (with reflex) - CYCLIC CITRUL PEPTIDE ANTIBODY, IGG/IGA - Rheumatoid factor - Sedimentation rate - Uric acid  5. Sickle-cell trait (HCC) Comments: Chronic, currently asymptomatic. She is encouraged to stay well hydrated.  6. Body mass index (BMI) 25.0-25.9, adult Comments: BMI 25. She is encouraged to aim for at least 150 minutes of exercise/week.    Return for 1 year physical, 4 month bp check. Patient was given opportunity to ask questions. Patient verbalized understanding of the plan and was able to repeat key elements of the plan. All questions were answered to their satisfaction.   I, Maria Aliment, MD, have reviewed all documentation for this visit. The documentation on 11/24/22 for the exam, diagnosis, procedures, and orders are all accurate and complete.   THE PATIENT IS ENCOURAGED TO PRACTICE SOCIAL DISTANCING DUE TO THE COVID-19 PANDEMIC.

## 2022-11-24 NOTE — Patient Instructions (Signed)

## 2022-11-26 MED ORDER — TRAZODONE HCL 100 MG PO TABS: ORAL_TABLET | ORAL | 1 refills | Status: DC

## 2022-11-27 DIAGNOSIS — M48062 Spinal stenosis, lumbar region with neurogenic claudication: Secondary | ICD-10-CM | POA: Diagnosis not present

## 2022-11-27 LAB — LIPID PANEL
Chol/HDL Ratio: 3.7 ratio (ref 0.0–4.4)
Cholesterol, Total: 196 mg/dL (ref 100–199)
HDL: 53 mg/dL (ref >39)
LDL Chol Calc (NIH): 116 mg/dL — ABNORMAL HIGH (ref 0–99)
Triglycerides: 154 mg/dL — ABNORMAL HIGH (ref 0–149)
VLDL Cholesterol Cal: 27 mg/dL (ref 5–40)

## 2022-11-27 LAB — MICROALBUMIN / CREATININE URINE RATIO
Creatinine, Urine: 235.4 mg/dL
Microalb/Creat Ratio: 10 mg/g creat (ref 0–29)
Microalbumin, Urine: 24.4 ug/mL

## 2022-11-27 LAB — BMP8+EGFR
BUN/Creatinine Ratio: 8 — ABNORMAL LOW (ref 12–28)
BUN: 8 mg/dL (ref 8–27)
CO2: 31 mmol/L — ABNORMAL HIGH (ref 20–29)
Calcium: 9.9 mg/dL (ref 8.7–10.3)
Chloride: 99 mmol/L (ref 96–106)
Creatinine, Ser: 0.96 mg/dL (ref 0.57–1.00)
Glucose: 82 mg/dL (ref 70–99)
Potassium: 4 mmol/L (ref 3.5–5.2)
Sodium: 142 mmol/L (ref 134–144)
eGFR: 62 mL/min/{1.73_m2} (ref >59)

## 2022-11-27 LAB — CYCLIC CITRUL PEPTIDE ANTIBODY, IGG/IGA: Cyclic Citrullin Peptide Ab: 4 units (ref 0–19)

## 2022-11-27 LAB — ANTINUCLEAR ANTIBODIES, IFA: ANA Titer 1: NEGATIVE

## 2022-11-27 LAB — SEDIMENTATION RATE: Sed Rate: 6 mm/hr (ref 0–40)

## 2022-11-27 LAB — URIC ACID: Uric Acid: 5 mg/dL (ref 3.1–7.9)

## 2022-11-27 LAB — RHEUMATOID FACTOR: Rheumatoid fact SerPl-aCnc: 12.4 IU/mL (ref <14.0)

## 2022-11-30 DIAGNOSIS — F330 Major depressive disorder, recurrent, mild: Secondary | ICD-10-CM | POA: Diagnosis not present

## 2022-11-30 MED ORDER — MECLIZINE HCL 12.5 MG PO TABS
12.5000 mg | ORAL_TABLET | Freq: Three times a day (TID) | ORAL | 0 refills | Status: DC | PRN
Start: 2022-11-30 — End: 2022-12-22

## 2022-12-01 DIAGNOSIS — M9903 Segmental and somatic dysfunction of lumbar region: Secondary | ICD-10-CM | POA: Diagnosis not present

## 2022-12-01 DIAGNOSIS — M9901 Segmental and somatic dysfunction of cervical region: Secondary | ICD-10-CM | POA: Diagnosis not present

## 2022-12-01 DIAGNOSIS — M9902 Segmental and somatic dysfunction of thoracic region: Secondary | ICD-10-CM | POA: Diagnosis not present

## 2022-12-01 DIAGNOSIS — M9904 Segmental and somatic dysfunction of sacral region: Secondary | ICD-10-CM | POA: Diagnosis not present

## 2022-12-03 DIAGNOSIS — H1045 Other chronic allergic conjunctivitis: Secondary | ICD-10-CM | POA: Diagnosis not present

## 2022-12-03 DIAGNOSIS — H2513 Age-related nuclear cataract, bilateral: Secondary | ICD-10-CM | POA: Diagnosis not present

## 2022-12-11 ENCOUNTER — Encounter: Payer: Self-pay | Admitting: Internal Medicine

## 2022-12-14 DIAGNOSIS — F330 Major depressive disorder, recurrent, mild: Secondary | ICD-10-CM | POA: Diagnosis not present

## 2022-12-16 ENCOUNTER — Other Ambulatory Visit: Payer: Self-pay | Admitting: Internal Medicine

## 2022-12-16 DIAGNOSIS — E7800 Pure hypercholesterolemia, unspecified: Secondary | ICD-10-CM

## 2022-12-21 ENCOUNTER — Other Ambulatory Visit: Payer: Medicare HMO

## 2022-12-21 ENCOUNTER — Other Ambulatory Visit: Payer: Self-pay | Admitting: Internal Medicine

## 2022-12-21 DIAGNOSIS — R42 Dizziness and giddiness: Secondary | ICD-10-CM

## 2022-12-21 DIAGNOSIS — E039 Hypothyroidism, unspecified: Secondary | ICD-10-CM

## 2022-12-21 DIAGNOSIS — R110 Nausea: Secondary | ICD-10-CM

## 2022-12-22 LAB — TSH+FREE T4
Free T4: 1.07 ng/dL (ref 0.82–1.77)
TSH: 2.38 u[IU]/mL (ref 0.450–4.500)

## 2023-01-05 DIAGNOSIS — M9904 Segmental and somatic dysfunction of sacral region: Secondary | ICD-10-CM | POA: Diagnosis not present

## 2023-01-05 DIAGNOSIS — M9901 Segmental and somatic dysfunction of cervical region: Secondary | ICD-10-CM | POA: Diagnosis not present

## 2023-01-05 DIAGNOSIS — M9902 Segmental and somatic dysfunction of thoracic region: Secondary | ICD-10-CM | POA: Diagnosis not present

## 2023-01-05 DIAGNOSIS — M9903 Segmental and somatic dysfunction of lumbar region: Secondary | ICD-10-CM | POA: Diagnosis not present

## 2023-01-06 ENCOUNTER — Ambulatory Visit (HOSPITAL_COMMUNITY)
Admission: RE | Admit: 2023-01-06 | Discharge: 2023-01-06 | Disposition: A | Discharge status: Home / Self Care | Payer: Self-pay | Source: Ambulatory Visit | Attending: Internal Medicine | Admitting: Internal Medicine

## 2023-01-06 DIAGNOSIS — E7800 Pure hypercholesterolemia, unspecified: Secondary | ICD-10-CM | POA: Insufficient documentation

## 2023-01-11 DIAGNOSIS — F330 Major depressive disorder, recurrent, mild: Secondary | ICD-10-CM | POA: Diagnosis not present

## 2023-02-02 ENCOUNTER — Other Ambulatory Visit: Payer: Self-pay | Admitting: Nurse Practitioner

## 2023-02-02 ENCOUNTER — Other Ambulatory Visit: Payer: Self-pay | Admitting: Internal Medicine

## 2023-02-02 DIAGNOSIS — R110 Nausea: Secondary | ICD-10-CM

## 2023-02-02 DIAGNOSIS — R42 Dizziness and giddiness: Secondary | ICD-10-CM

## 2023-02-03 DIAGNOSIS — M48062 Spinal stenosis, lumbar region with neurogenic claudication: Secondary | ICD-10-CM | POA: Diagnosis not present

## 2023-02-05 ENCOUNTER — Other Ambulatory Visit: Payer: Self-pay | Admitting: Internal Medicine

## 2023-02-05 DIAGNOSIS — Z1231 Encounter for screening mammogram for malignant neoplasm of breast: Secondary | ICD-10-CM

## 2023-02-08 DIAGNOSIS — F330 Major depressive disorder, recurrent, mild: Secondary | ICD-10-CM | POA: Diagnosis not present

## 2023-02-10 ENCOUNTER — Encounter: Payer: Self-pay | Admitting: Internal Medicine

## 2023-02-10 DIAGNOSIS — F329 Major depressive disorder, single episode, unspecified: Secondary | ICD-10-CM | POA: Diagnosis not present

## 2023-02-10 DIAGNOSIS — I499 Cardiac arrhythmia, unspecified: Secondary | ICD-10-CM | POA: Diagnosis not present

## 2023-02-10 DIAGNOSIS — Z008 Encounter for other general examination: Secondary | ICD-10-CM | POA: Diagnosis not present

## 2023-02-10 DIAGNOSIS — M1990 Unspecified osteoarthritis, unspecified site: Secondary | ICD-10-CM | POA: Diagnosis not present

## 2023-02-10 DIAGNOSIS — M545 Low back pain, unspecified: Secondary | ICD-10-CM | POA: Diagnosis not present

## 2023-02-10 DIAGNOSIS — G4700 Insomnia, unspecified: Secondary | ICD-10-CM | POA: Diagnosis not present

## 2023-02-10 DIAGNOSIS — J309 Allergic rhinitis, unspecified: Secondary | ICD-10-CM | POA: Diagnosis not present

## 2023-02-10 DIAGNOSIS — R2681 Unsteadiness on feet: Secondary | ICD-10-CM | POA: Diagnosis not present

## 2023-02-10 DIAGNOSIS — R42 Dizziness and giddiness: Secondary | ICD-10-CM | POA: Diagnosis not present

## 2023-02-10 DIAGNOSIS — I110 Hypertensive heart disease with heart failure: Secondary | ICD-10-CM | POA: Diagnosis not present

## 2023-02-10 DIAGNOSIS — E039 Hypothyroidism, unspecified: Secondary | ICD-10-CM | POA: Diagnosis not present

## 2023-02-10 DIAGNOSIS — I509 Heart failure, unspecified: Secondary | ICD-10-CM | POA: Diagnosis not present

## 2023-02-10 DIAGNOSIS — K5900 Constipation, unspecified: Secondary | ICD-10-CM | POA: Diagnosis not present

## 2023-02-23 ENCOUNTER — Encounter: Payer: Self-pay | Admitting: Internal Medicine

## 2023-02-23 DIAGNOSIS — M48062 Spinal stenosis, lumbar region with neurogenic claudication: Secondary | ICD-10-CM | POA: Diagnosis not present

## 2023-02-25 ENCOUNTER — Encounter: Payer: Self-pay | Admitting: Internal Medicine

## 2023-02-25 ENCOUNTER — Other Ambulatory Visit: Payer: Self-pay | Admitting: Internal Medicine

## 2023-02-25 DIAGNOSIS — I2510 Atherosclerotic heart disease of native coronary artery without angina pectoris: Secondary | ICD-10-CM

## 2023-02-25 MED ORDER — ATORVASTATIN CALCIUM 10 MG PO TABS: 10.0000 mg | ORAL_TABLET | Freq: Every day | ORAL | 11 refills | Status: DC

## 2023-02-26 ENCOUNTER — Ambulatory Visit
Admission: RE | Admit: 2023-02-26 | Discharge: 2023-02-26 | Disposition: A | Discharge status: Home / Self Care | Payer: Medicare HMO | Source: Ambulatory Visit | Attending: Internal Medicine | Admitting: Internal Medicine

## 2023-02-26 DIAGNOSIS — Z1231 Encounter for screening mammogram for malignant neoplasm of breast: Secondary | ICD-10-CM

## 2023-03-08 DIAGNOSIS — F330 Major depressive disorder, recurrent, mild: Secondary | ICD-10-CM | POA: Diagnosis not present

## 2023-03-19 ENCOUNTER — Other Ambulatory Visit: Payer: Self-pay

## 2023-03-19 DIAGNOSIS — R42 Dizziness and giddiness: Secondary | ICD-10-CM

## 2023-03-19 DIAGNOSIS — R110 Nausea: Secondary | ICD-10-CM

## 2023-03-19 MED ORDER — MECLIZINE HCL 12.5 MG PO TABS
ORAL_TABLET | ORAL | 3 refills | Status: DC
Start: 2023-03-19 — End: 2023-11-08

## 2023-03-22 DIAGNOSIS — F330 Major depressive disorder, recurrent, mild: Secondary | ICD-10-CM | POA: Diagnosis not present

## 2023-03-24 ENCOUNTER — Encounter: Payer: Self-pay | Admitting: Internal Medicine

## 2023-03-30 ENCOUNTER — Ambulatory Visit: Payer: Medicare HMO | Admitting: Internal Medicine

## 2023-04-09 DIAGNOSIS — Z0100 Encounter for examination of eyes and vision without abnormal findings: Secondary | ICD-10-CM | POA: Diagnosis not present

## 2023-04-19 DIAGNOSIS — F330 Major depressive disorder, recurrent, mild: Secondary | ICD-10-CM | POA: Diagnosis not present

## 2023-04-27 ENCOUNTER — Ambulatory Visit (INDEPENDENT_AMBULATORY_CARE_PROVIDER_SITE_OTHER): Payer: Medicare HMO | Admitting: Internal Medicine

## 2023-04-27 ENCOUNTER — Encounter: Payer: Self-pay | Admitting: Internal Medicine

## 2023-04-27 VITALS — BP 106/70 | HR 71 | Temp 97.9°F | Ht 66.0 in | Wt 157.6 lb

## 2023-04-27 DIAGNOSIS — I2510 Atherosclerotic heart disease of native coronary artery without angina pectoris: Secondary | ICD-10-CM

## 2023-04-27 DIAGNOSIS — E039 Hypothyroidism, unspecified: Secondary | ICD-10-CM

## 2023-04-27 DIAGNOSIS — E2839 Other primary ovarian failure: Secondary | ICD-10-CM | POA: Diagnosis not present

## 2023-04-27 DIAGNOSIS — D573 Sickle-cell trait: Secondary | ICD-10-CM | POA: Diagnosis not present

## 2023-04-27 DIAGNOSIS — I2584 Coronary atherosclerosis due to calcified coronary lesion: Secondary | ICD-10-CM

## 2023-04-27 DIAGNOSIS — R7309 Other abnormal glucose: Secondary | ICD-10-CM | POA: Diagnosis not present

## 2023-04-27 DIAGNOSIS — E7800 Pure hypercholesterolemia, unspecified: Secondary | ICD-10-CM | POA: Diagnosis not present

## 2023-04-27 DIAGNOSIS — M25551 Pain in right hip: Secondary | ICD-10-CM | POA: Diagnosis not present

## 2023-04-27 DIAGNOSIS — I10 Essential (primary) hypertension: Secondary | ICD-10-CM | POA: Diagnosis not present

## 2023-04-27 DIAGNOSIS — W010XXA Fall on same level from slipping, tripping and stumbling without subsequent striking against object, initial encounter: Secondary | ICD-10-CM | POA: Diagnosis not present

## 2023-04-27 DIAGNOSIS — I119 Hypertensive heart disease without heart failure: Secondary | ICD-10-CM | POA: Diagnosis not present

## 2023-04-27 NOTE — Patient Instructions (Signed)
Hypertension, Adult Hypertension is another name for high blood pressure. High blood pressure forces your heart to work harder to pump blood. This can cause problems over time. There are two numbers in a blood pressure reading. There is a top number (systolic) over a bottom number (diastolic). It is best to have a blood pressure that is below 120/80. What are the causes? The cause of this condition is not known. Some other conditions can lead to high blood pressure. What increases the risk? Some lifestyle factors can make you more likely to develop high blood pressure: Smoking. Not getting enough exercise or physical activity. Being overweight. Having too much fat, sugar, calories, or salt (sodium) in your diet. Drinking too much alcohol. Other risk factors include: Having any of these conditions: Heart disease. Diabetes. High cholesterol. Kidney disease. Obstructive sleep apnea. Having a family history of high blood pressure and high cholesterol. Age. The risk increases with age. Stress. What are the signs or symptoms? High blood pressure may not cause symptoms. Very high blood pressure (hypertensive crisis) may cause: Headache. Fast or uneven heartbeats (palpitations). Shortness of breath. Nosebleed. Vomiting or feeling like you may vomit (nauseous). Changes in how you see. Very bad chest pain. Feeling dizzy. Seizures. How is this treated? This condition is treated by making healthy lifestyle changes, such as: Eating healthy foods. Exercising more. Drinking less alcohol. Your doctor may prescribe medicine if lifestyle changes do not help enough and if: Your top number is above 130. Your bottom number is above 80. Your personal target blood pressure may vary. Follow these instructions at home: Eating and drinking  If told, follow the DASH eating plan. To follow this plan: Fill one half of your plate at each meal with fruits and vegetables. Fill one fourth of your plate  at each meal with whole grains. Whole grains include whole-wheat pasta, brown rice, and whole-grain bread. Eat or drink low-fat dairy products, such as skim milk or low-fat yogurt. Fill one fourth of your plate at each meal with low-fat (lean) proteins. Low-fat proteins include fish, chicken without skin, eggs, beans, and tofu. Avoid fatty meat, cured and processed meat, or chicken with skin. Avoid pre-made or processed food. Limit the amount of salt in your diet to less than 1,500 mg each day. Do not drink alcohol if: Your doctor tells you not to drink. You are pregnant, may be pregnant, or are planning to become pregnant. If you drink alcohol: Limit how much you have to: 0-1 drink a day for women. 0-2 drinks a day for men. Know how much alcohol is in your drink. In the [place] one drink equals one 12 oz bottle of beer (355 mL), one 5 oz glass of wine (148 mL), or one 1 oz glass of hard liquor (44 mL). Lifestyle  Work with your doctor to stay at a healthy weight or to lose weight. Ask your doctor what the best weight is for you. Get at least 30 minutes of exercise that causes your heart to beat faster (aerobic exercise) most days of the week. This may include walking, swimming, or biking. Get at least 30 minutes of exercise that strengthens your muscles (resistance exercise) at least 3 days a week. This may include lifting weights or doing Pilates. Do not smoke or use any products that contain nicotine or tobacco. If you need help quitting, ask your doctor. Check your blood pressure at home as told by your doctor. Keep all follow-up visits. Medicines Take over-the-counter and prescription medicines   only as told by your doctor. Follow directions carefully. Do not skip doses of blood pressure medicine. The medicine does not work as well if you skip doses. Skipping doses also puts you at risk for problems. Ask your doctor about side effects or reactions to medicines that you should watch  for. Contact a doctor if: You think you are having a reaction to the medicine you are taking. You have headaches that keep coming back. You feel dizzy. You have swelling in your ankles. You have trouble with your vision. Get help right away if: You get a very bad headache. You start to feel mixed up (confused). You feel weak or numb. You feel faint. You have very bad pain in your: Chest. Belly (abdomen). You vomit more than once. You have trouble breathing. These symptoms may be an emergency. Get help right away. Call 911. Do not wait to see if the symptoms will go away. Do not drive yourself to the hospital. Summary Hypertension is another name for high blood pressure. High blood pressure forces your heart to work harder to pump blood. For most people, a normal blood pressure is less than 120/80. Making healthy choices can help lower blood pressure. If your blood pressure does not get lower with healthy choices, you may need to take medicine. This information is not intended to replace advice given to you by your health care provider. Make sure you discuss any questions you have with your health care provider. Document Revised: 03/27/2021 Document Reviewed: 03/27/2021 Elsevier Patient Education  2024 [employer]  

## 2023-04-27 NOTE — Progress Notes (Signed)
I,Maria Hall, CMA,acting as a Neurosurgeon for Maria Aliment, MD.,have documented all relevant documentation on the behalf of Maria Aliment, MD,as directed by  Maria Aliment, MD while in the presence of Maria Aliment, MD.  Subjective:  Patient ID: Maria Hall , female    DOB: 1951-05-30 , 72 y.o.   MRN: [id number]  Chief Complaint  Patient presents with   Hyperlipidemia    HPI  She presents today for blood pressure & cholesterol check. She reports compliance with meds. Denies headaches, chest pain and shortness of breath. She states she is exercising 3 days per week 7min-1hr.     Hypertension This is a chronic problem. The current episode started more than 1 year ago. The problem has been gradually improving since onset. The problem is controlled. Pertinent negatives include no blurred vision or peripheral edema. Past treatments include diuretics. The current treatment provides moderate improvement. There are no compliance problems.      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 Take 2 caplets daily, Disp: , Rfl:    Ascorbic Acid (VITAMIN C) 1000 MG tablet, Take 1,000 mg by mouth daily., Disp: , Rfl:    aspirin EC 81 MG tablet, Take 81 mg by mouth daily., Disp: , Rfl:    atorvastatin (LIPITOR) 10 MG tablet, Take 1 tablet (10 mg total) by mouth daily., Disp: 30 tablet, Rfl: 11   cetirizine (ZYRTEC) 10 MG tablet, TAKE 1 TABLET BY MOUTH EVERY DAY, Disp: 90 tablet, Rfl: 2   cholecalciferol (VITAMIN D) 1000 units tablet, Take 1,000 Units by mouth daily., Disp: , Rfl:    Docusate Calcium (STOOL SOFTENER PO), Take 100 mg by mouth. Take 2 soft gels  daily, Disp: , Rfl:    folic acid (FOLVITE) 1 MG tablet, TAKE 1 TABLET BY MOUTH EVERY DAY, Disp: 90 tablet, Rfl: 1   hydrochlorothiazide (HYDRODIURIL) 12.5 MG tablet, TAKE 1 TABLET BY MOUTH EVERY DAY, Disp: 90 tablet, Rfl: 1   levothyroxine (SYNTHROID) 88 MCG tablet, TAKE 1 TABLET (88 MCG TOTAL) BY MOUTH DAILY BEFORE BREAKFAST. MON - SAT ONLY, Disp: 78 tablet, Rfl: 1   meclizine (ANTIVERT) 12.5 MG tablet, TAKE 1 TABLET BY MOUTH 3 TIMES A DAY AS NEEDED FOR DIZZINESS., Disp: 90 tablet, Rfl: 3   Omega 3-6-9 Fatty Acids (OMEGA 3-6-9 COMPLEX PO), Take 1,600 mg by mouth daily at 6 (six) AM., Disp: , Rfl:    traZODone (DESYREL) 100 MG tablet, One tab po qpm, Disp: 90 tablet, Rfl: 1   vitamin E 400 UNIT capsule, Take 400 Units by mouth daily., Disp: , Rfl:    fenofibrate (TRICOR) 145 MG tablet, Take 1 tablet (145 mg total) by mouth daily., Disp: 90 tablet, Rfl: 3   HYDROcodone-acetaminophen (NORCO/VICODIN) 5-325 MG tablet, Take 1-2 tablets by mouth every 6 (six) hours as needed., Disp: 10 tablet, Rfl: 0   No Known Allergies   Review of Systems  Constitutional: Negative.   Eyes:  Negative for blurred vision.  Respiratory: Negative.  Cardiovascular: Negative.   Gastrointestinal: Negative.   Musculoskeletal:  Positive for arthralgias.       She c/o right hip/knee pain. States she fell because she tripped on handle of the bag while in her closet. She did fall to the ground outside of the closet. She fell onto her right side. She states her right hip started to hurt immediately after the fall. She has taken Tylenol without relief of her symptoms.   Psychiatric/Behavioral: Negative.       Today's Vitals   04/27/23 0912  BP: 106/70  Pulse: 71  Temp: 97.9 F (36.6 C)  SpO2: 98%  Weight: 157 lb 9.6 oz (71.5 kg)  Height: 5\' 6"  (1.676 m)   Body mass index is 25.44 kg/m.  Wt Readings from Last 3 Encounters:  05/03/23 162 lb 6.4 oz (73.7 kg)  04/28/23 157 lb 9.6 oz (71.5 kg)  04/27/23 157 lb 9.6  oz (71.5 kg)     Objective:  Physical Exam Vitals and nursing note reviewed.  Constitutional:      Appearance: Normal appearance.  HENT:     Head: Normocephalic and atraumatic.  Eyes:     Extraocular Movements: Extraocular movements intact.  Cardiovascular:     Rate and Rhythm: Normal rate and regular rhythm.     Heart sounds: Normal heart sounds.  Pulmonary:     Effort: Pulmonary effort is normal.     Breath sounds: Normal breath sounds.  Musculoskeletal:        General: No tenderness. Normal range of motion.     Cervical back: Normal range of motion.  Skin:    General: Skin is warm.  Neurological:     General: No focal deficit present.     Mental Status: She is alert.  Psychiatric:        Mood and Affect: Mood normal.        Behavior: Behavior normal.         Assessment And Plan:  Hypertensive heart disease without heart failure Assessment & Plan: Chronic, well controlled.  She will continue with hydrochlorothiazide 12.5mg  daily. Encouraged to follow low sodium diet.   Orders: -     CMP14+EGFR  Coronary artery calcification of native artery Assessment & Plan: Chronic, LDL goal is less than 70. She will continue with atorvastatin and ASA 81mg  daily. Encouraged to follow low sodium diet.   Orders: -     Lipid panel  Pure hypercholesterolemia Assessment & Plan: Chronic, LDL goal is less than 70.  She will continue with atorvastatin 10mg  daily. Encouraged to follow heart healthy lifestyle. She was commended on her regular exercise regimen.   Orders: -     CMP14+EGFR -     Lipid panel  Fall on same level from slipping, tripping or stumbling, initial encounter Assessment & Plan: Fall occurred on Saturday, 04/24/23.   She tripped over bag handle while in her closet. Encouraged to move all bags onto a shelf to decrease her risk of falls.    Right hip pain Assessment & Plan: She is s/p fall. She is ambulatory. She has full ROM. She will take Tylenol prn.     Other abnormal glucose Assessment & Plan: Previous labs reviewed, her A1c has been elevated in the past. I will check an A1c today. Reminded to avoid refined sugars including sugary drinks/foods and processed meats including bacon, sausages and deli meats.    Orders: -     Hemoglobin A1c  Estrogen deficiency Assessment & Plan: She is  due for bone density, will schedule.   Orders: -     DG Bone Density; Future     Return if symptoms worsen or fail to improve.  Patient was given opportunity to ask questions. Patient verbalized understanding of the plan and was able to repeat key elements of the plan. All questions were answered to their satisfaction.    I, Maria Aliment, MD, have reviewed all documentation for this visit. The documentation on 04/27/23 for the exam, diagnosis, procedures, and orders are all accurate and complete.   IF YOU HAVE BEEN REFERRED TO A SPECIALIST, IT MAY TAKE 1-2 WEEKS TO SCHEDULE/PROCESS THE REFERRAL. IF YOU HAVE NOT HEARD FROM US/SPECIALIST IN TWO WEEKS, PLEASE GIVE [place] A CALL AT [phone].   THE PATIENT IS ENCOURAGED TO PRACTICE SOCIAL DISTANCING DUE TO THE COVID-19 PANDEMIC.

## 2023-04-28 ENCOUNTER — Emergency Department (HOSPITAL_BASED_OUTPATIENT_CLINIC_OR_DEPARTMENT_OTHER)
Admission: EM | Admit: 2023-04-28 | Discharge: 2023-04-28 | Disposition: A | Discharge status: Home / Self Care | Payer: Medicare HMO | Attending: Emergency Medicine | Admitting: Emergency Medicine

## 2023-04-28 ENCOUNTER — Other Ambulatory Visit (HOSPITAL_BASED_OUTPATIENT_CLINIC_OR_DEPARTMENT_OTHER): Payer: Self-pay

## 2023-04-28 ENCOUNTER — Other Ambulatory Visit: Payer: Self-pay

## 2023-04-28 ENCOUNTER — Encounter (HOSPITAL_BASED_OUTPATIENT_CLINIC_OR_DEPARTMENT_OTHER): Payer: Self-pay | Admitting: Emergency Medicine

## 2023-04-28 ENCOUNTER — Emergency Department (HOSPITAL_BASED_OUTPATIENT_CLINIC_OR_DEPARTMENT_OTHER): Payer: Medicare HMO

## 2023-04-28 DIAGNOSIS — S8391XA Sprain of unspecified site of right knee, initial encounter: Secondary | ICD-10-CM | POA: Diagnosis not present

## 2023-04-28 DIAGNOSIS — S8991XA Unspecified injury of right lower leg, initial encounter: Secondary | ICD-10-CM | POA: Diagnosis not present

## 2023-04-28 DIAGNOSIS — Y9301 Activity, walking, marching and hiking: Secondary | ICD-10-CM | POA: Insufficient documentation

## 2023-04-28 DIAGNOSIS — Y92002 Bathroom of unspecified non-institutional (private) residence single-family (private) house as the place of occurrence of the external cause: Secondary | ICD-10-CM | POA: Diagnosis not present

## 2023-04-28 DIAGNOSIS — W010XXA Fall on same level from slipping, tripping and stumbling without subsequent striking against object, initial encounter: Secondary | ICD-10-CM | POA: Diagnosis not present

## 2023-04-28 DIAGNOSIS — Z7982 Long term (current) use of aspirin: Secondary | ICD-10-CM | POA: Diagnosis not present

## 2023-04-28 DIAGNOSIS — Z79899 Other long term (current) drug therapy: Secondary | ICD-10-CM | POA: Diagnosis not present

## 2023-04-28 DIAGNOSIS — M25561 Pain in right knee: Secondary | ICD-10-CM | POA: Diagnosis not present

## 2023-04-28 DIAGNOSIS — S7001XA Contusion of right hip, initial encounter: Secondary | ICD-10-CM | POA: Diagnosis not present

## 2023-04-28 DIAGNOSIS — M25461 Effusion, right knee: Secondary | ICD-10-CM | POA: Diagnosis not present

## 2023-04-28 DIAGNOSIS — M1711 Unilateral primary osteoarthritis, right knee: Secondary | ICD-10-CM | POA: Diagnosis not present

## 2023-04-28 DIAGNOSIS — S838X1A Sprain of other specified parts of right knee, initial encounter: Secondary | ICD-10-CM | POA: Diagnosis not present

## 2023-04-28 LAB — CMP14+EGFR
ALT: 10 [IU]/L (ref 0–32)
AST: 18 [IU]/L (ref 0–40)
Albumin: 3.6 g/dL — ABNORMAL LOW (ref 3.8–4.8)
Alkaline Phosphatase: 116 [IU]/L (ref 44–121)
BUN/Creatinine Ratio: 9 — ABNORMAL LOW (ref 12–28)
BUN: 8 mg/dL (ref 8–27)
Bilirubin Total: 0.4 mg/dL (ref 0.0–1.2)
CO2: 30 mmol/L — ABNORMAL HIGH (ref 20–29)
Calcium: 9.7 mg/dL (ref 8.7–10.3)
Chloride: 100 mmol/L (ref 96–106)
Creatinine, Ser: 0.87 mg/dL (ref 0.57–1.00)
Globulin, Total: 2.4 g/dL (ref 1.5–4.5)
Glucose: 91 mg/dL (ref 70–99)
Potassium: 4.1 mmol/L (ref 3.5–5.2)
Sodium: 142 mmol/L (ref 134–144)
Total Protein: 6 g/dL (ref 6.0–8.5)
eGFR: 70 mL/min/{1.73_m2} (ref >59)

## 2023-04-28 LAB — LIPID PANEL
Chol/HDL Ratio: 2.7 ratio (ref 0.0–4.4)
Cholesterol, Total: 123 mg/dL (ref 100–199)
HDL: 46 mg/dL (ref >39)
LDL Chol Calc (NIH): 47 mg/dL (ref 0–99)
Triglycerides: 187 mg/dL — ABNORMAL HIGH (ref 0–149)
VLDL Cholesterol Cal: 30 mg/dL (ref 5–40)

## 2023-04-28 LAB — HEMOGLOBIN A1C
Est. average glucose Bld gHb Est-mCnc: 126 mg/dL
Hgb A1c MFr Bld: 6 % — ABNORMAL HIGH (ref 4.8–5.6)

## 2023-04-28 MED ORDER — HYDROCODONE-ACETAMINOPHEN 5-325 MG PO TABS
1.0000 | ORAL_TABLET | ORAL | 0 refills | Status: DC | PRN
  Filled 2023-04-28: qty 10, 3d supply, fill #0

## 2023-04-28 NOTE — ED Triage Notes (Signed)
Pt states she injured her right leg on Saturday.  Pt states she was ok and could walk but went to PCP.  Pt states last night she twisted her leg and her right knee had severe pain and she wasn't able to walk on it.  Noted some swelling to right knee.

## 2023-04-28 NOTE — Discharge Instructions (Addendum)
Make an appointment to follow-up with your orthopedist.  Use ice to the area.  Keep the leg elevated.  Return to the emergency if you have any worsening symptoms.

## 2023-04-28 NOTE — ED Notes (Signed)
Pt refused to take the crutches due to safety. Pt was given a rolling walker instead. Do not charge for crutches.

## 2023-04-28 NOTE — ED Provider Notes (Addendum)
[place] [facility] AT [facility] Provider Note   CSN: [id number] Arrival date & time: 04/28/23  1012     History  Chief Complaint  Patient presents with   Leg Injury    Maria Hall is a 72 y.o. female.  Patient is a 72 year old female who presents with right knee pain.  She states that a few days ago, she tripped and fell, injuring her right knee and bruising her right hip.  She said overall it felt better and she had it checked out by her PCP.  However last night she was walking from the bathroom and says she stepped on it and she was not sure if she twisted it but it gave out on her and it has been hurting worse since then.  She has not really been able to bear weight on it.  She is not running any fevers.  No other injuries.  She did not hit her head.  No neck or back pain.  She said her right hip feels okay.        Home Medications Prior to Admission medications   Medication Sig Start Date End Date Taking? Authorizing Provider  HYDROcodone-acetaminophen (NORCO/VICODIN) 5-325 MG tablet Take 1-2 tablets by mouth every 4 (four) hours as needed. 04/28/23  Yes Rolan Bucco, MD  acetaminophen (TYLENOL) 650 MG CR tablet Take 650 mg by mouth. Take 2 caplets daily    [provider]  Ascorbic Acid (VITAMIN C) 1000 MG tablet Take 1,000 mg by mouth daily.    [provider]  aspirin EC 81 MG tablet Take 81 mg by mouth daily.    [provider]  atorvastatin (LIPITOR) 10 MG tablet Take 1 tablet (10 mg total) by mouth daily. 02/25/23 02/25/24  Dorothyann Peng, MD  cetirizine (ZYRTEC) 10 MG tablet TAKE 1 TABLET BY MOUTH EVERY DAY 06/05/22   Dorothyann Peng, MD  cholecalciferol (VITAMIN D) 1000 units tablet Take 1,000 Units by mouth daily.    [provider]  Docusate Calcium (STOOL SOFTENER PO) Take 100 mg by mouth. Take 2 soft gels daily    [provider]  folic acid (FOLVITE) 1 MG tablet TAKE 1 TABLET BY MOUTH EVERY  DAY 08/13/21   Dorothyann Peng, MD  hydrochlorothiazide (HYDRODIURIL) 12.5 MG tablet TAKE 1 TABLET BY MOUTH EVERY DAY 02/03/23   Dorothyann Peng, MD  levothyroxine (SYNTHROID) 88 MCG tablet TAKE 1 TABLET (88 MCG TOTAL) BY MOUTH DAILY BEFORE BREAKFAST. MON - SAT ONLY 02/03/23   Arnette Felts, FNP  meclizine (ANTIVERT) 12.5 MG tablet TAKE 1 TABLET BY MOUTH 3 TIMES A DAY AS NEEDED FOR DIZZINESS. 03/19/23   Dorothyann Peng, MD  Omega 3-6-9 Fatty Acids (OMEGA 3-6-9 COMPLEX PO) Take 1,600 mg by mouth daily at 6 (six) AM.    [provider]  traZODone (DESYREL) 100 MG tablet One tab po qpm 11/26/22   Dorothyann Peng, MD  vitamin E 400 UNIT capsule Take 400 Units by mouth daily.    [provider]      Allergies    Patient has no known allergies.    Review of Systems   Review of Systems  Constitutional:  Negative for fever.  Gastrointestinal:  Negative for nausea and vomiting.  Musculoskeletal:  Positive for arthralgias. Negative for back pain, joint swelling and neck pain.  Skin:  Negative for wound.  Neurological:  Negative for weakness, numbness and headaches.    Physical Exam Updated Vital Signs BP 119/71 (BP Location:  Right Arm)   Pulse 78   Temp 98.8 F (37.1 C) (Oral)   Resp 16   Ht 5\' 6"  (1.676 m)   Wt 71.5 kg   SpO2 100%   BMI 25.44 kg/m  Physical Exam Constitutional:      Appearance: She is well-developed.  HENT:     Head: Normocephalic and atraumatic.  Cardiovascular:     Rate and Rhythm: Normal rate.  Pulmonary:     Effort: Pulmonary effort is normal.  Musculoskeletal:        General: Tenderness present.     Cervical back: Normal range of motion and neck supple.     Comments: Positive diffuse tenderness to the right knee.  There are some mild swelling to the knee.  No pain on range of motion of the hip or ankle.  She is able to do a straight leg raise however it is limited due to her discomfort.  Pedal pulses are intact.  She has normal sensation and motor  function distally.  No open wounds.  She does have some areas of ecchymosis to the anterior knee.  Skin:    General: Skin is warm and dry.  Neurological:     Mental Status: She is alert and oriented to person, place, and time.     ED Results / Procedures / Treatments   Labs (all labs ordered are listed, but only abnormal results are displayed) Labs Reviewed - No data to display  EKG None  Radiology DG Knee Complete 4 Views Right  Result Date: 04/28/2023 CLINICAL DATA:  Fall.  Pain. EXAM: RIGHT KNEE - COMPLETE 4+ VIEW COMPARISON:  Right knee radiographs dated Oct 21, 2005. FINDINGS: No acute fracture or dislocation. A small joint effusion is present. Tricompartmental osteophytosis with joint space narrowing, most pronounced in the medial and patellofemoral compartments. Mild soft tissue swelling about the knee. IMPRESSION: 1. No acute osseous abnormality. 2. 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 Electronically Signed   By: Hart Robinsons M.D.   On: 04/28/2023 12:06    Procedures Procedures    Medications Ordered in ED Medications - No data to display  ED Course/ Medical Decision Making/ A&P                                 Medical Decision Making Amount and/or Complexity of Data Reviewed Radiology: ordered.  Risk Prescription drug management.   Patient is a 72 year old who presents with pain in her right knee.  There is some mild swelling to the area.  No signs of infection.  X-rays were obtained which were interpreted by me and confirmed by the radiologist to show no evidence of fracture.  She does not have any other apparent injuries.  She has some soreness on her lateral hip but no pain with range of motion of the hip.  Doubt fracture.  She was placed in a knee sleeve.  Was advised on ice and elevation.  She does not have a walker at home.  She think she will do fine with crutches.  Will try this but I did advise her it would be advisable to try  to obtain a walker.  She has been seen previously at [facility] orthoped[facility].  Advised her to make an appointment to follow-up with them.  Return precautions were given.  She was given a small prescription for Vicodin per her request.  Patient did not do well with crutches.  We actually do have walkers here that we can give her for discharge.  Final Clinical Impression(s) / ED Diagnoses Final diagnoses:  Sprain of right knee, unspecified ligament, initial encounter    Rx / DC Orders ED Discharge Orders          Ordered    HYDROcodone-acetaminophen (NORCO/VICODIN) 5-325 MG tablet  Every 4 hours PRN        04/28/23 1254              Rolan Bucco, MD 04/28/23 1257    Rolan Bucco, MD 04/28/23 1327

## 2023-04-28 NOTE — ED Notes (Signed)
Pt outfitted with walker

## 2023-04-29 ENCOUNTER — Telehealth: Payer: Self-pay

## 2023-04-29 DIAGNOSIS — M1711 Unilateral primary osteoarthritis, right knee: Secondary | ICD-10-CM | POA: Diagnosis not present

## 2023-04-29 DIAGNOSIS — M25461 Effusion, right knee: Secondary | ICD-10-CM | POA: Diagnosis not present

## 2023-04-29 NOTE — Transitions of Care (Post Inpatient/ED Visit) (Signed)
   04/29/2023  Name: Maria Hall MRN: [id number] DOB: 31-Aug-1950  Today's TOC FU Call Status: Today's TOC FU Call Status:: Unsuccessful Call (1st Attempt) Unsuccessful Call (1st Attempt) Date: 04/29/23  Attempted to reach the patient regarding the most recent Inpatient/ED visit.  Follow Up Plan: Additional outreach attempts will be made to reach the patient to complete the Transitions of Care (Post Inpatient/ED visit) call.   Signature  Lisabeth Devoid, [place]

## 2023-05-03 ENCOUNTER — Encounter: Payer: Self-pay | Admitting: Cardiology

## 2023-05-03 ENCOUNTER — Ambulatory Visit: Payer: Medicare HMO | Attending: Cardiology | Admitting: Cardiology

## 2023-05-03 VITALS — BP 118/66 | HR 77 | Ht 66.0 in | Wt 162.4 lb

## 2023-05-03 DIAGNOSIS — I2510 Atherosclerotic heart disease of native coronary artery without angina pectoris: Secondary | ICD-10-CM

## 2023-05-03 DIAGNOSIS — E781 Pure hyperglyceridemia: Secondary | ICD-10-CM | POA: Diagnosis not present

## 2023-05-03 DIAGNOSIS — M79606 Pain in leg, unspecified: Secondary | ICD-10-CM

## 2023-05-03 MED ORDER — FENOFIBRATE 145 MG PO TABS: 145.0000 mg | ORAL_TABLET | Freq: Every day | ORAL | 3 refills | Status: DC

## 2023-05-03 NOTE — Progress Notes (Signed)
Cardiology Office Note:    Date:  05/03/2023   ID:  Maria Hall, DOB 10/12/1950, MRN [id number]  PCP:  Dorothyann Peng, MD  Cardiologist:  None  Electrophysiologist:  None   Referring MD: Dorothyann Peng, MD   " I am having leg pain "  History of Present Illness:    Maria Hall is a 72 y.o. female with a hx of  coronary calcification, prediabetes, and high cholesterol, presents with pain in her leg and feet, primarily in the right foot it mostly felt on exertion. The patient is active, going to the gym three days a week for weight training and is involved in sorority activities.  The patient recently had a fall, resulting in severe pain in the knee and hip. The pain was evaluated by an orthopedic doctor who diagnosed stretched tendons as the cause of the pain. The patient was prescribed hydrocodone and an anti-inflammatory medication, which have improved the pain significantly.  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   Bronchitis    gets every year in the winter   Insomnia    takes meds   Right rotator cuff tear    Swelling    in hands and ankles   Thyroid disease     Past Surgical History:  Procedure Laterality Date   ABDOMINAL HYSTERECTOMY     BUNIONECTOMY     bil feet   CESAREAN SECTION     CESAREAN SECTION     1 time   CHOLECYSTECTOMY     COLONOSCOPY     tummy tuck      Current Medications: Current Meds  Medication Sig   acetaminophen (TYLENOL) 650 MG CR tablet Take 650 mg by mouth. Take 2 caplets daily   Ascorbic Acid (VITAMIN C) 1000 MG tablet Take 1,000 mg by mouth daily.   aspirin EC 81 MG tablet Take 81 mg by mouth daily.   atorvastatin (LIPITOR) 10 MG tablet Take 1 tablet (10 mg total) by mouth daily.   cetirizine (ZYRTEC) 10 MG tablet TAKE 1 TABLET BY MOUTH EVERY DAY   cholecalciferol (VITAMIN D) 1000 units tablet Take 1,000 Units by mouth daily.   Docusate Calcium (STOOL SOFTENER PO) Take 100 mg by  mouth. Take 2 soft gels daily   fenofibrate (TRICOR) 145 MG tablet Take 1 tablet (145 mg total) by mouth daily.   folic acid (FOLVITE) 1 MG tablet TAKE 1 TABLET BY MOUTH EVERY DAY   hydrochlorothiazide (HYDRODIURIL) 12.5 MG tablet TAKE 1 TABLET BY MOUTH EVERY DAY   HYDROcodone-acetaminophen (NORCO/VICODIN) 5-325 MG tablet Take 1-2 tablets by mouth every 4 (four) hours as needed.   levothyroxine (SYNTHROID) 88 MCG tablet TAKE 1 TABLET (88 MCG TOTAL) BY MOUTH DAILY BEFORE BREAKFAST. MON - SAT ONLY   meclizine (ANTIVERT) 12.5 MG tablet TAKE 1 TABLET BY MOUTH 3 TIMES A DAY AS NEEDED FOR DIZZINESS.   Omega 3-6-9 Fatty Acids (OMEGA 3-6-9 COMPLEX PO) Take 1,600 mg by mouth daily at 6 (six) AM.   traZODone (DESYREL) 100 MG tablet One tab po qpm   vitamin E 400 UNIT capsule Take 400 Units by mouth daily.     Allergies:   Patient has no known allergies.   Social History   Socioeconomic History   Marital status: Married    Spouse name: Not on file   Number of children: Not on file   Years of education: Not on file   Highest education  level: Bachelor's degree (e.g., BA, AB, BS)  Occupational History   Occupation: retired  Tobacco Use   Smoking status: Former    Current packs/day: 0.00    Average packs/day: 1 pack/day for 20.0 years (20.0 ttl pk-yrs)    Types: Cigarettes    Start date: 28    Quit date: 1990    Years since quitting: 34.8   Smokeless tobacco: Never  Vaping Use   Vaping status: Never Used  Substance and Sexual Activity   Alcohol use: Not Currently   Drug use: No   Sexual activity: Yes  Other Topics Concern   Not on file  Social History Narrative   Not on file   Social Determinants of Health   Financial Resource Strain: Low Risk  (04/26/2023)   Overall Financial Resource Strain (CARDIA)    Difficulty of Paying Living Expenses: Not hard at all  Food Insecurity: No Food Insecurity (04/26/2023)   Hunger Vital Sign    Worried About Running Out of Food in the Last Year:  Never true    Ran Out of Food in the Last Year: Never true  Transportation Needs: No Transportation Needs (04/26/2023)   PRAPARE - Administrator, Civil Service (Medical): No    Lack of Transportation (Non-Medical): No  Physical Activity: Insufficiently Active (04/26/2023)   Exercise Vital Sign    Days of Exercise per Week: 3 days    Minutes of Exercise per Session: 30 min  Stress: No Stress Concern Present (04/26/2023)   [employer] of Occupational Health - Occupational Stress Questionnaire    Feeling of Stress : Only a little  Social Connections: Socially Integrated (04/26/2023)   Social Connection and Isolation Panel [NHANES]    Frequency of Communication with Friends and Family: More than three times a week    Frequency of Social Gatherings with Friends and Family: Twice a week    Attends Religious Services: More than 4 times per year    Active Member of [employer] or Organizations: Yes    Attends Engineer, structural: More than 4 times per year    Marital Status: Married     Family History: The patient's family history is not on file. She was adopted.  ROS:   Review of Systems  Constitution: Negative for decreased appetite, fever and weight gain.  HENT: Negative for congestion, ear discharge, hoarse voice and sore throat.   Eyes: Negative for discharge, redness, vision loss in right eye and visual halos.  Cardiovascular: Negative for chest pain, dyspnea on exertion, leg swelling, orthopnea and palpitations.  Respiratory: Negative for cough, hemoptysis, shortness of breath and snoring.   Endocrine: Negative for heat intolerance and polyphagia.  Hematologic/Lymphatic: Negative for bleeding problem. Does not bruise/bleed easily.  Skin: Negative for flushing, nail changes, rash and suspicious lesions.  Musculoskeletal: Negative for arthritis, joint pain, muscle cramps, myalgias, neck pain and stiffness.  Gastrointestinal: Negative for abdominal pain, bowel  incontinence, diarrhea and excessive appetite.  Genitourinary: Negative for decreased libido, genital sores and incomplete emptying.  Neurological: Negative for brief paralysis, focal weakness, headaches and loss of balance.  Psychiatric/Behavioral: Negative for altered mental status, depression and suicidal ideas.  Allergic/Immunologic: Negative for HIV exposure and persistent infections.    EKGs/Labs/Other Studies Reviewed:    The following studies were reviewed today:   EKG: None today   Recent Labs: 09/24/2022: Hemoglobin 12.5; Platelets 530 12/21/2022: TSH 2.380 04/27/2023: ALT 10; BUN 8; Creatinine, Ser 0.87; Potassium 4.1; Sodium 142  Recent Lipid  Panel    Component Value Date/Time   CHOL 123 04/27/2023 0946   TRIG 187 (H) 04/27/2023 0946   HDL 46 04/27/2023 0946   CHOLHDL 2.7 04/27/2023 0946   LDLCALC 47 04/27/2023 0946    Physical Exam:    VS:  BP 118/66 (BP Location: Left Arm, Patient Position: Sitting, Cuff Size: Normal)   Pulse 77   Ht 5\' 6"  (1.676 m)   Wt 162 lb 6.4 oz (73.7 kg)   SpO2 93%   BMI 26.21 kg/m     Wt Readings from Last 3 Encounters:  05/03/23 162 lb 6.4 oz (73.7 kg)  04/28/23 157 lb 9.6 oz (71.5 kg)  04/27/23 157 lb 9.6 oz (71.5 kg)     GEN: Well nourished, well developed in no acute distress HEENT: Normal NECK: No JVD; No carotid bruits LYMPHATICS: No lymphadenopathy CARDIAC: S1S2 noted,RRR, no murmurs, rubs, gallops RESPIRATORY:  Clear to auscultation without rales, wheezing or rhonchi  ABDOMEN: Soft, non-tender, non-distended, +bowel sounds, no guarding. EXTREMITIES: No edema, No cyanosis, no clubbing MUSCULOSKELETAL:  No deformity  SKIN: Warm and dry NEUROLOGIC:  Alert and oriented x 3, non-focal PSYCHIATRIC:  Normal affect, good insight  ASSESSMENT:    1. Pain of lower extremity, unspecified laterality   2. Hypertriglyceridemia   3. Coronary artery calcification seen on CAT scan    PLAN:    Leg pain predominantly right  footpain. will rule out peripheral vascular disease. -Order lower extremity arterial Doppler ultrasound to rule out peripheral vascular disease.  Hypertriglyceridemia Triglycerides elevated at 187. Discussed dietary modification, specifically reduction in carbohydrate and sugar intake, as initial management. -Attempt dietary modification to reduce triglyceride levels. -Repeat lipid panel in 12 weeks to reassess triglyceride levels.  Coronary Artery calcification Coronary calcification score of 13.7, indicating mild disease. LDL at goal (<55). -Continue current cholesterol medication.  Prediabetes Stable HbA1c at 6.0. -Continue current management.   Follow-up in 1 year unless issues arise with peripheral neuropathy workup. Return in 12 weeks for repeat lipid panel. The patient is in agreement with the above plan. The patient left the office in stable condition.  The patient will follow up in   Medication Adjustments/Labs and Tests Ordered: Current medicines are reviewed at length with the patient today.  Concerns regarding medicines are outlined above.  Orders Placed This Encounter  Procedures   Lipid panel   VAS [place] ABI WITH/WO TBI   Meds ordered this encounter  Medications   fenofibrate (TRICOR) 145 MG tablet    Sig: Take 1 tablet (145 mg total) by mouth daily.    Dispense:  90 tablet    Refill:  3    Patient Instructions  Medication Instructions:  Your physician has recommended you make the following change in your medication:  START: Fenofibrate 145 mg once daily *If you need a refill on your cardiac medications before your next appointment, please call your pharmacy*   Lab Work: In 12 weeks: Lipids - please come fasting. No appointment is needed.  If you have labs (blood work) drawn today and your tests are completely normal, you will receive your results only by: MyChart Message (if you have MyChart) OR A paper copy in the mail If you have any lab test that is  abnormal or we need to change your treatment, we will call you to review the results.   Testing/Procedures: Your physician has requested that you have an ankle brachial index (ABI). During this test an ultrasound and blood pressure cuff are used  to evaluate the arteries that supply the arms and legs with blood. Allow thirty minutes for this exam. There are no restrictions or special instructions.  Please note: We ask at that you not bring children with you during ultrasound (echo/ vascular) testing. Due to room size and safety concerns, children are not allowed in the ultrasound rooms during exams. Our front office staff cannot provide observation of children in our lobby area while testing is being conducted. An adult accompanying a patient to their appointment will only be allowed in the ultrasound room at the discretion of the ultrasound technician under special circumstances. We apologize for any inconvenience.    Follow-Up: At [facility], you and your health needs are our priority.  As part of our continuing mission to provide you with exceptional heart care, we have created designated Provider Care Teams.  These Care Teams include your primary Cardiologist (physician) and Advanced Practice Providers (APPs -  Physician Assistants and Nurse Practitioners) who all work together to provide you with the care you need, when you need it.   Your next appointment:   1 year(s)  Provider:   Thomasene Ripple, DO     Adopting a Healthy Lifestyle.  Know what a healthy weight is for you (roughly BMI <25) and aim to maintain this   Aim for 7+ servings of fruits and vegetables daily   65-80+ fluid ounces of water or unsweet tea for healthy kidneys   Limit to max 1 drink of alcohol per day; avoid smoking/tobacco   Limit animal fats in diet for cholesterol and heart health - choose grass fed whenever available   Avoid highly processed foods, and foods high in saturated/trans fats   Aim for  low stress - take time to unwind and care for your mental health   Aim for 150 min of moderate intensity exercise weekly for heart health, and weights twice weekly for bone health   Aim for 7-9 hours of sleep daily   When it comes to diets, agreement about the perfect plan isnt easy to find, even among the experts. Experts at the [facility] [employer] developed an idea known as the Healthy Eating Plate. Just imagine a plate divided into logical, healthy portions.   The emphasis is on diet quality:   Load up on vegetables and fruits - one-half of your plate: Aim for color and variety, and remember that potatoes dont count.   Go for whole grains - one-quarter of your plate: Whole wheat, barley, wheat berries, quinoa, oats, brown rice, and foods made with them. If you want pasta, go with whole wheat pasta.   Protein power - one-quarter of your plate: Fish, chicken, beans, and nuts are all healthy, versatile protein sources. Limit red meat.   The diet, however, does go beyond the plate, offering a few other suggestions.   Use healthy plant oils, such as olive, canola, soy, corn, sunflower and peanut. Check the labels, and avoid partially hydrogenated oil, which have unhealthy trans fats.   If youre thirsty, drink water. Coffee and tea are good in moderation, but skip sugary drinks and limit milk and dairy products to one or two daily servings.   The type of carbohydrate in the diet is more important than the amount. Some sources of carbohydrates, such as vegetables, fruits, whole grains, and beans-are healthier than others.   Finally, stay active  Signed, Thomasene Ripple, DO  05/03/2023 10:16 AM     [facility]

## 2023-05-03 NOTE — Patient Instructions (Signed)
Medication Instructions:  Your physician has recommended you make the following change in your medication:  START: Fenofibrate 145 mg once daily *If you need a refill on your cardiac medications before your next appointment, please call your pharmacy*   Lab Work: In 12 weeks: Lipids - please come fasting. No appointment is needed.  If you have labs (blood work) drawn today and your tests are completely normal, you will receive your results only by: MyChart Message (if you have MyChart) OR A paper copy in the mail If you have any lab test that is abnormal or we need to change your treatment, we will call you to review the results.   Testing/Procedures: Your physician has requested that you have an ankle brachial index (ABI). During this test an ultrasound and blood pressure cuff are used to evaluate the arteries that supply the arms and legs with blood. Allow thirty minutes for this exam. There are no restrictions or special instructions.  Please note: We ask at that you not bring children with you during ultrasound (echo/ vascular) testing. Due to room size and safety concerns, children are not allowed in the ultrasound rooms during exams. Our front office staff cannot provide observation of children in our lobby area while testing is being conducted. An adult accompanying a patient to their appointment will only be allowed in the ultrasound room at the discretion of the ultrasound technician under special circumstances. We apologize for any inconvenience.    Follow-Up: At [facility], you and your health needs are our priority.  As part of our continuing mission to provide you with exceptional heart care, we have created designated Provider Care Teams.  These Care Teams include your primary Cardiologist (physician) and Advanced Practice Providers (APPs -  Physician Assistants and Nurse Practitioners) who all work together to provide you with the care you need, when you need  it.   Your next appointment:   1 year(s)  Provider:   Thomasene Ripple, DO

## 2023-05-05 ENCOUNTER — Other Ambulatory Visit: Payer: Self-pay | Admitting: Internal Medicine

## 2023-05-05 MED ORDER — HYDROCODONE-ACETAMINOPHEN 5-325 MG PO TABS: 1.0000 | ORAL_TABLET | Freq: Four times a day (QID) | ORAL | 0 refills | Status: DC | PRN

## 2023-05-08 ENCOUNTER — Encounter: Payer: Self-pay | Admitting: Internal Medicine

## 2023-05-09 ENCOUNTER — Encounter: Payer: Self-pay | Admitting: Internal Medicine

## 2023-05-09 DIAGNOSIS — E7800 Pure hypercholesterolemia, unspecified: Secondary | ICD-10-CM | POA: Insufficient documentation

## 2023-05-09 DIAGNOSIS — I2510 Atherosclerotic heart disease of native coronary artery without angina pectoris: Secondary | ICD-10-CM | POA: Insufficient documentation

## 2023-05-09 DIAGNOSIS — M25551 Pain in right hip: Secondary | ICD-10-CM | POA: Insufficient documentation

## 2023-05-09 DIAGNOSIS — W010XXA Fall on same level from slipping, tripping and stumbling without subsequent striking against object, initial encounter: Secondary | ICD-10-CM | POA: Insufficient documentation

## 2023-05-09 DIAGNOSIS — E2839 Other primary ovarian failure: Secondary | ICD-10-CM | POA: Insufficient documentation

## 2023-05-09 NOTE — Assessment & Plan Note (Signed)
She is due for bone density, will schedule.

## 2023-05-09 NOTE — Assessment & Plan Note (Addendum)
Chronic, LDL goal is less than 70. She will continue with atorvastatin and ASA 81mg  daily. Encouraged to follow low sodium diet.

## 2023-05-09 NOTE — Assessment & Plan Note (Signed)
Chronic, well controlled.  She will continue with hydrochlorothiazide 12.5mg  daily. Encouraged to follow low sodium diet.

## 2023-05-09 NOTE — Assessment & Plan Note (Signed)
Chronic, LDL goal is less than 70.  She will continue with atorvastatin 10mg  daily. Encouraged to follow heart healthy lifestyle. She was commended on her regular exercise regimen.

## 2023-05-09 NOTE — Assessment & Plan Note (Signed)
Fall occurred on Saturday, 04/24/23.   She tripped over bag handle while in her closet. Encouraged to move all bags onto a shelf to decrease her risk of falls.

## 2023-05-09 NOTE — Assessment & Plan Note (Signed)
Previous labs reviewed, her A1c has been elevated in the past. I will check an A1c today. Reminded to avoid refined sugars including sugary drinks/foods and processed meats including bacon, sausages and deli meats.  

## 2023-05-09 NOTE — Assessment & Plan Note (Signed)
She is s/p fall. She is ambulatory. She has full ROM. She will take Tylenol prn.

## 2023-05-18 DIAGNOSIS — M48062 Spinal stenosis, lumbar region with neurogenic claudication: Secondary | ICD-10-CM | POA: Diagnosis not present

## 2023-05-23 ENCOUNTER — Other Ambulatory Visit: Payer: Self-pay | Admitting: Nurse Practitioner

## 2023-06-01 ENCOUNTER — Ambulatory Visit (HOSPITAL_COMMUNITY)
Admission: RE | Admit: 2023-06-01 | Discharge: 2023-06-01 | Disposition: A | Discharge status: Home / Self Care | Payer: Medicare HMO | Source: Ambulatory Visit | Attending: Cardiovascular Disease | Admitting: Cardiovascular Disease

## 2023-06-01 DIAGNOSIS — M79604 Pain in right leg: Secondary | ICD-10-CM

## 2023-06-01 DIAGNOSIS — M79606 Pain in leg, unspecified: Secondary | ICD-10-CM | POA: Diagnosis not present

## 2023-06-02 DIAGNOSIS — M48062 Spinal stenosis, lumbar region with neurogenic claudication: Secondary | ICD-10-CM | POA: Diagnosis not present

## 2023-06-07 DIAGNOSIS — F330 Major depressive disorder, recurrent, mild: Secondary | ICD-10-CM | POA: Diagnosis not present

## 2023-06-10 LAB — VAS US ABI WITH/WO TBI
Left ABI: 1.26
Right ABI: 1.21

## 2023-07-05 DIAGNOSIS — F330 Major depressive disorder, recurrent, mild: Secondary | ICD-10-CM | POA: Diagnosis not present

## 2023-07-11 ENCOUNTER — Other Ambulatory Visit: Payer: Self-pay | Admitting: Internal Medicine

## 2023-07-12 ENCOUNTER — Other Ambulatory Visit: Payer: Self-pay

## 2023-07-12 DIAGNOSIS — E781 Pure hyperglyceridemia: Secondary | ICD-10-CM | POA: Diagnosis not present

## 2023-07-13 LAB — LIPID PANEL
Chol/HDL Ratio: 2.5 {ratio} (ref 0.0–4.4)
Cholesterol, Total: 120 mg/dL (ref 100–199)
HDL: 48 mg/dL (ref >39)
LDL Chol Calc (NIH): 55 mg/dL (ref 0–99)
Triglycerides: 87 mg/dL (ref 0–149)
VLDL Cholesterol Cal: 17 mg/dL (ref 5–40)

## 2023-07-16 DIAGNOSIS — M48062 Spinal stenosis, lumbar region with neurogenic claudication: Secondary | ICD-10-CM | POA: Diagnosis not present

## 2023-07-18 ENCOUNTER — Encounter: Payer: Self-pay | Admitting: Cardiology

## 2023-08-11 ENCOUNTER — Ambulatory Visit: Payer: Medicare HMO

## 2023-08-11 DIAGNOSIS — Z0000 Encounter for general adult medical examination without abnormal findings: Secondary | ICD-10-CM | POA: Diagnosis not present

## 2023-08-11 NOTE — Progress Notes (Signed)
 Subjective:   Maria Hall is a 73 y.o. female who presents for Medicare Annual (Subsequent) preventive examination.  Visit Complete: Virtual I connected with  Maria Hall on 08/11/23 by a audio enabled telemedicine application and verified that I am speaking with the correct person using two identifiers. Interactive audio and video telecommunications were attempted between this provider and patient, however failed, due to patient having technical difficulties OR patient did not have access to video capability.  We continued and completed visit with audio only.  Patient Location: Home  Provider Location: Home Office  I discussed the limitations of evaluation and management by telemedicine. The patient expressed understanding and agreed to proceed.  Vital Signs: Because this visit was a virtual/telehealth visit, some criteria may be missing or patient reported. Any vitals not documented were not able to be obtained and vitals that have been documented are patient reported.    Cardiac Risk Factors include: advanced age (>64men, >76 women);hypertension     Objective:    Today's Vitals   There is no height or weight on file to calculate BMI.     08/11/2023   10:09 AM 04/28/2023   10:35 AM 07/22/2022   10:03 AM 06/25/2021    4:04 PM 05/12/2021   10:01 AM 03/24/2021    3:09 PM 07/04/2020    8:46 AM  Advanced Directives  Does Patient Have a Medical Advance Directive? Yes Yes Yes Yes Yes No Yes  Type of Estate agent of [place];Living will Healthcare Power of [place];Living will Healthcare Power of [place];Living will Healthcare Power of [place];Living will Living will;Healthcare Power of [employer] Power of [place];Living will  Copy of Healthcare Power of Attorney in Chart? No - copy requested No - copy requested No - copy requested No - copy requested No - copy requested  No - copy requested  Would patient like information on creating a  medical advance directive?      No - Patient declined     Current Medications (verified) Outpatient Encounter Medications as of 08/11/2023  Medication Sig   acetaminophen (TYLENOL) 650 MG CR tablet Take 650 mg by mouth. Take 2 caplets daily   Ascorbic Acid (VITAMIN C) 1000 MG tablet Take 1,000 mg by mouth daily.   aspirin EC 81 MG tablet Take 81 mg by mouth daily.   atorvastatin (LIPITOR) 10 MG tablet Take 1 tablet (10 mg total) by mouth daily.   cetirizine (ZYRTEC) 10 MG tablet TAKE 1 TABLET BY MOUTH EVERY DAY   cholecalciferol (VITAMIN D) 1000 units tablet Take 1,000 Units by mouth daily.   Docusate Calcium (STOOL SOFTENER PO) Take 100 mg by mouth. Take 2 soft gels daily   DULoxetine (CYMBALTA) 30 MG capsule Take 30 mg by mouth 2 (two) times daily.   fenofibrate (TRICOR) 145 MG tablet Take 1 tablet (145 mg total) by mouth daily.   folic acid (FOLVITE) 1 MG tablet TAKE 1 TABLET BY MOUTH EVERY DAY   gabapentin (NEURONTIN) 300 MG capsule Take 300 mg by mouth at bedtime.   hydrochlorothiazide (HYDRODIURIL) 12.5 MG tablet TAKE 1 TABLET BY MOUTH EVERY DAY   levothyroxine (SYNTHROID) 88 MCG tablet TAKE 1 TABLET (88 MCG TOTAL) BY MOUTH DAILY BEFORE BREAKFAST. MON - SAT ONLY   meclizine (ANTIVERT) 12.5 MG tablet TAKE 1 TABLET BY MOUTH 3 TIMES A DAY AS NEEDED FOR DIZZINESS.   meloxicam (MOBIC) 15 MG tablet Take 15 mg by mouth daily.   Omega 3-6-9 Fatty Acids (OMEGA  3-6-9 COMPLEX PO) Take 1,600 mg by mouth daily at 6 (six) AM.   traZODone (DESYREL) 100 MG tablet One tab po qpm   vitamin E 400 UNIT capsule Take 400 Units by mouth daily.   HYDROcodone-acetaminophen (NORCO/VICODIN) 5-325 MG tablet Take 1-2 tablets by mouth every 6 (six) hours as needed. (Patient not taking: Reported on 08/11/2023)   No facility-administered encounter medications on file as of 08/11/2023.    Allergies (verified) Patient has no known allergies.   History: Past Medical History:  Diagnosis Date   Arthritis    in  shoulders and hips   Bronchitis    gets every year in the winter   Insomnia    takes meds   Right rotator cuff tear    Swelling    in hands and ankles   Thyroid disease    Past Surgical History:  Procedure Laterality Date   ABDOMINAL HYSTERECTOMY     BUNIONECTOMY     bil feet   CESAREAN SECTION     CESAREAN SECTION     1 time   CHOLECYSTECTOMY     COLONOSCOPY     tummy tuck     Family History  Adopted: Yes   Social History   Socioeconomic History   Marital status: Married    Spouse name: Not on file   Number of children: Not on file   Years of education: Not on file   Highest education level: Bachelor's degree (e.g., BA, AB, BS)  Occupational History   Occupation: retired  Tobacco Use   Smoking status: Former    Current packs/day: 0.00    Average packs/day: 1 pack/day for 20.0 years (20.0 ttl pk-yrs)    Types: Cigarettes    Start date: 63    Quit date: 1990    Years since quitting: 35.1   Smokeless tobacco: Never  Vaping Use   Vaping status: Never Used  Substance and Sexual Activity   Alcohol use: Not Currently   Drug use: No   Sexual activity: Yes  Other Topics Concern   Not on file  Social History Narrative   Not on file   Social Drivers of Health   Financial Resource Strain: Low Risk  (08/11/2023)   Overall Financial Resource Strain (CARDIA)    Difficulty of Paying Living Expenses: Not hard at all  Food Insecurity: No Food Insecurity (08/11/2023)   Hunger Vital Sign    Worried About Running Out of Food in the Last Year: Never true    Ran Out of Food in the Last Year: Never true  Transportation Needs: No Transportation Needs (08/11/2023)   PRAPARE - Administrator, Civil Service (Medical): No    Lack of Transportation (Non-Medical): No  Physical Activity: Inactive (08/11/2023)   Exercise Vital Sign    Days of Exercise per Week: 0 days    Minutes of Exercise per Session: 0 min  Stress: No Stress Concern Present (08/11/2023)   [employer] of [employer]ational [employer] - Occupational Stress Questionnaire    Feeling of Stress : Not at all  Social Connections: Socially Integrated (08/11/2023)   Social Connection and Isolation Panel [NHANES]    Frequency of Communication with Friends and Family: More than three times a week    Frequency of Social Gatherings with Friends and Family: Three times a week    Attends Religious Services: More than 4 times per year    Active Member of Clubs or Organizations: Yes    Attends Club or  Organization Meetings: More than 4 times per year    Marital Status: Married    Tobacco Counseling Counseling given: Not Answered   Clinical Intake:  Pre-visit preparation completed: Yes  Pain : No/denies pain     Nutritional Risks: None Diabetes: No  How often do you need to have someone help you when you read instructions, pamphlets, or other written materials from your doctor or pharmacy?: 1 - Never  Interpreter Needed?: No  Information entered by :: NAllen LPN   Activities of Daily Living    08/11/2023    9:57 AM  In your present state of health, do you have any difficulty performing the following activities:  Hearing? 0  Vision? 0  Difficulty concentrating or making decisions? 0  Walking or climbing stairs? 1  Comment sometimes due to knee  Dressing or bathing? 0  Doing errands, shopping? 0  Preparing Food and eating ? N  Using the Toilet? N  In the past six months, have you accidently leaked urine? N  Do you have problems with loss of bowel control? Y  Comment has happened two times in last year  Managing your Medications? N  Managing your Finances? N  Housekeeping or managing your Housekeeping? N    Patient Care Team: Dorothyann Peng, MD as PCP - General (Internal Medicine) Sallye Lat, MD as Consulting Physician (Ophthalmology)  Indicate any recent Medical Services you may have received from other than [facility] providers in the past year (date may be  approximate).     Assessment:   This is a routine wellness examination for Nevena.  Hearing/Vision screen Hearing Screening - Comments:: Denies hearing issues Vision Screening - Comments:: Regular eye exams, [facility]   Goals Addressed             This Visit's Progress    Patient Stated       08/11/2023, start exercise in March       Depression Screen    08/11/2023   10:14 AM 04/27/2023    9:13 AM 11/24/2022    9:33 AM 09/24/2022    2:17 PM 07/22/2022   10:06 AM 06/25/2021    4:06 PM 07/04/2020    8:48 AM  PHQ 2/9 Scores  PHQ - 2 Score 0 0 0 0 0 0 0  PHQ- 9 Score 0 0 2 0       Fall Risk    08/11/2023   10:11 AM 04/27/2023    9:13 AM 11/24/2022    9:33 AM 09/24/2022    2:17 PM 07/22/2022   10:04 AM  Fall Risk   Falls in the past year? 1 0 1 1 1   Comment foot got caught in a bag handle, tripped, turned to fast    miss steps  Number falls in past yr: 1 0 1 1 1   Injury with Fall? 1 0 1 1 0  Comment hurt knee    bruises and scrapes  Risk for fall due to : History of fall(s);Medication side effect No Fall Risks History of fall(s) History of fall(s) Medication side effect  Follow up Falls prevention discussed;Falls evaluation completed Falls evaluation completed Falls evaluation completed Falls evaluation completed Falls prevention discussed;Education provided;Falls evaluation completed    MEDICARE RISK AT HOME: Medicare Risk at Home Any stairs in or around the home?: Yes If so, are there any without handrails?: Yes Home free of loose throw rugs in walkways, pet beds, electrical cords, etc?: No Adequate lighting in your home to reduce  risk of falls?: Yes Life alert?: No Use of a cane, walker or w/c?: No Grab bars in the bathroom?: No Shower chair or bench in shower?: No Elevated toilet seat or a handicapped toilet?: Yes  TIMED UP AND GO:  Was the test performed?  No    Cognitive Function:        08/11/2023   10:15 AM 07/22/2022   10:07 AM 06/25/2021    4:07 PM  07/04/2020    8:50 AM 06/21/2019   11:20 AM  6CIT Screen  What Year? 0 points 0 points 0 points 0 points 0 points  What month? 0 points 0 points 0 points 0 points 0 points  What time? 0 points 0 points 0 points 0 points 0 points  Count back from 20 0 points 0 points 0 points 0 points 0 points  Months in reverse 0 points 0 points 0 points 0 points 0 points  Repeat phrase 0 points 2 points 2 points 0 points 0 points  Total Score 0 points 2 points 2 points 0 points 0 points    Immunizations Immunization History  Administered Date(s) Administered   Fluad Quad(high Dose 65+) 03/03/2022   Influenza, High Dose Seasonal PF 04/08/2018, 03/01/2019   Influenza-Unspecified 04/11/2018, 03/03/2019, 04/02/2020, 04/12/2021, 03/03/2022, 02/23/2023   PFIZER Comirnaty(Gray Top)Covid-19 Tri-Sucrose Vaccine 04/03/2022   PFIZER(Purple Top)SARS-COV-2 Vaccination 07/28/2019, 08/18/2019, 03/18/2020, 10/01/2020, 02/23/2023   Pfizer Covid-19 Vaccine Bivalent Booster 32yrs & up 03/31/2021, 04/03/2022   Pneumococcal Conjugate-13 03/10/2016   Pneumococcal Polysaccharide-23 07/12/2014   Respiratory Syncytial Virus Vaccine,Recomb Aduvanted(Arexvy) 03/03/2022   Tdap 01/22/2014   Zoster Recombinant(Shingrix) 04/04/2017, 07/27/2017   Zoster, Live 03/09/2016    TDAP status: Up to date  Flu Vaccine status: Up to date  Pneumococcal vaccine status: Up to date  Covid-19 vaccine status: Completed vaccines  Qualifies for Shingles Vaccine? Yes   Zostavax completed Yes   Shingrix Completed?: Yes  Screening Tests Health Maintenance  Topic Date Due   COVID-19 Vaccine (8 - 2024-25 season) 04/20/2023   DTaP/Tdap/Td (2 - Td or Tdap) 01/23/2024   Colonoscopy  08/10/2024   Medicare Annual Wellness (AWV)  08/10/2024   MAMMOGRAM  02/25/2025   Pneumonia Vaccine 78+ Years old  Completed   INFLUENZA VACCINE  Completed   DEXA SCAN  Completed   Hepatitis C Screening  Completed   Zoster Vaccines- Shingrix  Completed    HPV VACCINES  Aged Out    Health Maintenance  Health Maintenance Due  Topic Date Due   COVID-19 Vaccine (8 - 2024-25 season) 04/20/2023    Colorectal cancer screening: Type of screening: Colonoscopy. Completed 08/10/2017. Repeat every 7 years  Mammogram status: Completed 02/26/2023. Repeat every year  Bone Density status: scheduled for 12/27/2023.   Lung Cancer Screening: (Low Dose CT Chest recommended if Age 61-80 years, 20 pack-year currently smoking OR have quit w/in 15years.) does not qualify.   Lung Cancer Screening Referral: no  Additional Screening:  Hepatitis C Screening: does qualify; Completed 06/09/2018  Vision Screening: Recommended annual ophthalmology exams for early detection of glaucoma and other disorders of the eye. Is the patient up to date with their annual eye exam?  Yes  Who is the provider or what is the name of the office in which the patient attends annual eye exams? [facility] If pt is not established with a provider, would they like to be referred to a provider to establish care? No .   Dental Screening: Recommended annual dental exams for proper oral  hygiene  Diabetic Foot Exam: n/a  Community Resource Referral / Chronic Care Management: CRR required this visit?  No   CCM required this visit?  No     Plan:     I have personally reviewed and noted the following in the patient's chart:   Medical and social history Use of alcohol, tobacco or illicit drugs  Current medications and supplements including opioid prescriptions. Patient is not currently taking opioid prescriptions. Functional ability and status Nutritional status Physical activity Advanced directives List of other physicians Hospitalizations, surgeries, and ER visits in previous 12 months Vitals Screenings to include cognitive, depression, and falls Referrals and appointments  In addition, I have reviewed and discussed with patient certain preventive protocols, quality  metrics, and best practice recommendations. A written personalized care plan for preventive services as well as general preventive health recommendations were provided to patient.     Barb Merino, LPN   [id number]   After Visit Summary: (MyChart) Due to this being a telephonic visit, the after visit summary with patients personalized plan was offered to patient via MyChart   Nurse Notes: none

## 2023-08-11 NOTE — Patient Instructions (Signed)
 Maria Hall , Thank you for taking time to come for your Medicare Wellness Visit. I appreciate your ongoing commitment to your health goals. Please review the following plan we discussed and let me know if I can assist you in the future.   Referrals/Orders/Follow-Ups/Clinician Recommendations: none  This is a list of the screening recommended for you and due dates:  Health Maintenance  Topic Date Due   COVID-19 Vaccine (8 - 2024-25 season) 04/20/2023   DTaP/Tdap/Td vaccine (2 - Td or Tdap) 01/23/2024   Colon Cancer Screening  08/10/2024   Medicare Annual Wellness Visit  08/10/2024   Mammogram  02/25/2025   Pneumonia Vaccine  Completed   Flu Shot  Completed   DEXA scan (bone density measurement)  Completed   Hepatitis C Screening  Completed   Zoster (Shingles) Vaccine  Completed   HPV Vaccine  Aged Out    Advanced directives: (Copy Requested) Please bring a copy of your health care power of attorney and living will to the office to be added to your chart at your convenience.  Next Medicare Annual Wellness Visit scheduled for next year: Yes  insert Preventive Care attachment Insert FALL PREVENTION attachment if needed

## 2023-08-16 DIAGNOSIS — F330 Major depressive disorder, recurrent, mild: Secondary | ICD-10-CM | POA: Diagnosis not present

## 2023-08-30 DIAGNOSIS — F330 Major depressive disorder, recurrent, mild: Secondary | ICD-10-CM | POA: Diagnosis not present

## 2023-09-13 DIAGNOSIS — F330 Major depressive disorder, recurrent, mild: Secondary | ICD-10-CM | POA: Diagnosis not present

## 2023-09-16 DIAGNOSIS — M48062 Spinal stenosis, lumbar region with neurogenic claudication: Secondary | ICD-10-CM | POA: Diagnosis not present

## 2023-10-04 DIAGNOSIS — F330 Major depressive disorder, recurrent, mild: Secondary | ICD-10-CM | POA: Diagnosis not present

## 2023-10-11 DIAGNOSIS — M1711 Unilateral primary osteoarthritis, right knee: Secondary | ICD-10-CM | POA: Diagnosis not present

## 2023-10-12 DIAGNOSIS — M48062 Spinal stenosis, lumbar region with neurogenic claudication: Secondary | ICD-10-CM | POA: Diagnosis not present

## 2023-10-25 DIAGNOSIS — F330 Major depressive disorder, recurrent, mild: Secondary | ICD-10-CM | POA: Diagnosis not present

## 2023-11-05 DIAGNOSIS — K5900 Constipation, unspecified: Secondary | ICD-10-CM | POA: Diagnosis not present

## 2023-11-05 DIAGNOSIS — Z008 Encounter for other general examination: Secondary | ICD-10-CM | POA: Diagnosis not present

## 2023-11-05 DIAGNOSIS — I739 Peripheral vascular disease, unspecified: Secondary | ICD-10-CM | POA: Diagnosis not present

## 2023-11-05 DIAGNOSIS — D473 Essential (hemorrhagic) thrombocythemia: Secondary | ICD-10-CM | POA: Diagnosis not present

## 2023-11-05 DIAGNOSIS — M1990 Unspecified osteoarthritis, unspecified site: Secondary | ICD-10-CM | POA: Diagnosis not present

## 2023-11-05 DIAGNOSIS — D573 Sickle-cell trait: Secondary | ICD-10-CM | POA: Diagnosis not present

## 2023-11-05 DIAGNOSIS — Z9181 History of falling: Secondary | ICD-10-CM | POA: Diagnosis not present

## 2023-11-05 DIAGNOSIS — Z87891 Personal history of nicotine dependence: Secondary | ICD-10-CM | POA: Diagnosis not present

## 2023-11-05 DIAGNOSIS — Z7982 Long term (current) use of aspirin: Secondary | ICD-10-CM | POA: Diagnosis not present

## 2023-11-05 DIAGNOSIS — J309 Allergic rhinitis, unspecified: Secondary | ICD-10-CM | POA: Diagnosis not present

## 2023-11-05 DIAGNOSIS — F419 Anxiety disorder, unspecified: Secondary | ICD-10-CM | POA: Diagnosis not present

## 2023-11-05 DIAGNOSIS — E785 Hyperlipidemia, unspecified: Secondary | ICD-10-CM | POA: Diagnosis not present

## 2023-11-05 DIAGNOSIS — E039 Hypothyroidism, unspecified: Secondary | ICD-10-CM | POA: Diagnosis not present

## 2023-11-07 ENCOUNTER — Other Ambulatory Visit: Payer: Self-pay | Admitting: Internal Medicine

## 2023-11-07 DIAGNOSIS — R42 Dizziness and giddiness: Secondary | ICD-10-CM

## 2023-11-07 DIAGNOSIS — R110 Nausea: Secondary | ICD-10-CM

## 2023-11-08 DIAGNOSIS — F330 Major depressive disorder, recurrent, mild: Secondary | ICD-10-CM | POA: Diagnosis not present

## 2023-11-22 DIAGNOSIS — F330 Major depressive disorder, recurrent, mild: Secondary | ICD-10-CM | POA: Diagnosis not present

## 2023-11-29 ENCOUNTER — Encounter: Payer: Self-pay | Admitting: Internal Medicine

## 2023-11-29 ENCOUNTER — Ambulatory Visit: Payer: Self-pay | Admitting: Internal Medicine

## 2023-11-29 ENCOUNTER — Ambulatory Visit (INDEPENDENT_AMBULATORY_CARE_PROVIDER_SITE_OTHER): Payer: Self-pay | Admitting: Internal Medicine

## 2023-11-29 VITALS — BP 110/70 | HR 67 | Temp 98.2°F | Ht 66.0 in | Wt 155.0 lb

## 2023-11-29 DIAGNOSIS — D75839 Thrombocytosis, unspecified: Secondary | ICD-10-CM

## 2023-11-29 DIAGNOSIS — K5909 Other constipation: Secondary | ICD-10-CM | POA: Insufficient documentation

## 2023-11-29 DIAGNOSIS — D573 Sickle-cell trait: Secondary | ICD-10-CM

## 2023-11-29 DIAGNOSIS — R7309 Other abnormal glucose: Secondary | ICD-10-CM | POA: Diagnosis not present

## 2023-11-29 DIAGNOSIS — E039 Hypothyroidism, unspecified: Secondary | ICD-10-CM

## 2023-11-29 DIAGNOSIS — R42 Dizziness and giddiness: Secondary | ICD-10-CM | POA: Insufficient documentation

## 2023-11-29 DIAGNOSIS — Z0000 Encounter for general adult medical examination without abnormal findings: Secondary | ICD-10-CM | POA: Insufficient documentation

## 2023-11-29 DIAGNOSIS — M25551 Pain in right hip: Secondary | ICD-10-CM | POA: Diagnosis not present

## 2023-11-29 DIAGNOSIS — I119 Hypertensive heart disease without heart failure: Secondary | ICD-10-CM | POA: Insufficient documentation

## 2023-11-29 DIAGNOSIS — I2510 Atherosclerotic heart disease of native coronary artery without angina pectoris: Secondary | ICD-10-CM | POA: Diagnosis not present

## 2023-11-29 DIAGNOSIS — E559 Vitamin D deficiency, unspecified: Secondary | ICD-10-CM | POA: Diagnosis not present

## 2023-11-29 DIAGNOSIS — E7800 Pure hypercholesterolemia, unspecified: Secondary | ICD-10-CM

## 2023-11-29 DIAGNOSIS — I2584 Coronary atherosclerosis due to calcified coronary lesion: Secondary | ICD-10-CM | POA: Diagnosis not present

## 2023-11-29 LAB — POCT URINALYSIS DIP (CLINITEK)
Bilirubin, UA: NEGATIVE
Blood, UA: NEGATIVE
Glucose, UA: NEGATIVE mg/dL
Ketones, POC UA: NEGATIVE mg/dL
Leukocytes, UA: NEGATIVE
Nitrite, UA: NEGATIVE
POC PROTEIN,UA: NEGATIVE
Spec Grav, UA: 1.015 (ref 1.010–1.025)
Urobilinogen, UA: 0.2 U/dL
pH, UA: 7.5 (ref 5.0–8.0)

## 2023-11-29 MED ORDER — MECLIZINE HCL 12.5 MG PO TABS: ORAL_TABLET | ORAL | Status: DC

## 2023-11-29 MED ORDER — FOLIC ACID 1 MG PO TABS: 1.0000 mg | ORAL_TABLET | Freq: Every day | ORAL | 1 refills | Status: AC

## 2023-11-29 MED ORDER — MELOXICAM 15 MG PO TABS: 15.0000 mg | ORAL_TABLET | Freq: Every day | ORAL | 1 refills | Status: DC

## 2023-11-29 NOTE — Assessment & Plan Note (Signed)
 Chronic, well-managed on Synthroid  88 mcg daily, Monday through Saturday. -I will check thyroid  panel and adjust meds as needed.

## 2023-11-29 NOTE — Assessment & Plan Note (Signed)
 Chronic, LDL goal is less than 70. She will continue with atorvastatin and ASA 81mg  daily. Encouraged to follow low sodium diet.

## 2023-11-29 NOTE — Progress Notes (Signed)
 I,Maria J Llittleton, CMA,acting as a Neurosurgeon for Maria Dung, MD.,have documented all relevant documentation on the behalf of Maria Dung, MD,as directed by  Maria Dung, MD while in the presence of Maria Dung, MD.  Subjective:    Patient ID: Maria Hall , female    DOB: September 09, 1950 , 73 y.o.   MRN: [id number]  Chief Complaint  Patient presents with   Annual Exam    Patient presents today for a physical. Patient reports compliance with her meds. She denies headaches, chest pain and shortness of breath. She currently does not have an GYN.    Hypertension    HPI Discussed the use of AI scribe software for clinical note transcription with the patient, who gave verbal consent to proceed.  History of Present Illness Maria Hall is a 73 year old female who presents for an annual physical exam and blood pressure check.  She has been experiencing right hip pain for approximately six months. The pain is localized to the lateral aspect of her hip and is exacerbated by lying on it. Unlike previous episodes of sciatica-like pain that radiated down the posterior aspect of her leg, this pain does not radiate. She has been taking Tylenol  Arthritis without relief and has not consulted her orthopedic specialists for this issue.  She is currently taking trazodone  at night for sleep, meloxicam, duloxetine, which was switched from gabapentin due to insurance issues, and atorvastatin  10 mg for cholesterol management. Her Synthroid  dosage is 88 mcg daily, Monday through Saturday, and she takes HCTZ 12.5 mg daily.  She has a history of knee pain, for which she received gel injections that have been effective. She remains physically active and exercises regularly, with no significant impact from her hip pain.     Hypertension This is a chronic problem. The current episode started more than 1 year ago. The problem has been gradually improving since onset. The problem is controlled.  Pertinent negatives include no blurred vision or peripheral edema. Risk factors for coronary artery disease include post-menopausal state. Past treatments include diuretics. The current treatment provides moderate improvement. There are no compliance problems.  Identifiable causes of hypertension include a thyroid  problem.  Thyroid  Problem Presents for follow-up visit. Patient reports no depressed mood. The symptoms have been stable.     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 Take 2 caplets daily, Disp: , Rfl:    Ascorbic Acid (VITAMIN C) 1000 MG tablet, Take 1,000 mg by mouth daily., Disp: , Rfl:    aspirin EC 81 MG tablet, Take 81 mg by mouth daily., Disp: , Rfl:    atorvastatin  (LIPITOR) 10 MG tablet, Take 1 tablet (10 mg total) by mouth daily., Disp: 30 tablet, Rfl: 11   cetirizine  (ZYRTEC ) 10 MG tablet, TAKE 1 TABLET BY MOUTH EVERY DAY, Disp: 90 tablet, Rfl: 2   cholecalciferol (VITAMIN D) 1000 units tablet, Take 1,000 Units by mouth daily., Disp: , Rfl:    Docusate Calcium  (STOOL SOFTENER PO), Take 100 mg  by mouth. Take 2 soft gels daily, Disp: , Rfl:    DULoxetine (CYMBALTA) 30 MG capsule, Take 30 mg by mouth 2 (two) times daily., Disp: , Rfl:    fenofibrate  (TRICOR ) 145 MG tablet, Take 1 tablet (145 mg total) by mouth daily., Disp: 90 tablet, Rfl: 3   hydrochlorothiazide  (HYDRODIURIL ) 12.5 MG tablet, TAKE 1 TABLET BY MOUTH EVERY DAY, Disp: 90 tablet, Rfl: 1   HYDROcodone -acetaminophen  (NORCO/VICODIN) 5-325 MG tablet, Take 1-2 tablets by mouth every 6 (six) hours as needed., Disp: 10 tablet, Rfl: 0   levothyroxine  (SYNTHROID ) 88 MCG tablet, TAKE 1 TABLET  (88 MCG TOTAL) BY MOUTH DAILY BEFORE BREAKFAST. MON - SAT ONLY, Disp: 78 tablet, Rfl: 1   Omega 3-6-9 Fatty Acids (OMEGA 3-6-9 COMPLEX PO), Take 1,600 mg by mouth daily at 6 (six) AM., Disp: , Rfl:    traZODone  (DESYREL ) 100 MG tablet, One tab po qpm, Disp: 90 tablet, Rfl: 1   vitamin E 400 UNIT capsule, Take 400 Units by mouth daily., Disp: , Rfl:    folic acid  (FOLVITE ) 1 MG tablet, Take 1 tablet (1 mg total) by mouth daily., Disp: 90 tablet, Rfl: 1   meclizine  (ANTIVERT ) 12.5 MG tablet, TAKE 1 TABLET BY MOUTH THREE TIMES A DAY AS NEEDED FOR DIZZINESS, Disp: , Rfl:    meloxicam (MOBIC) 15 MG tablet, Take 1 tablet (15 mg total) by mouth daily., Disp: 30 tablet, Rfl: 1   No Known Allergies    The patient states she uses post menopausal status for birth control. No LMP recorded. Patient has had a hysterectomy.. Negative for Dysmenorrhea. Negative for: breast discharge, breast lump(s), breast pain and breast self exam. Associated symptoms include abnormal vaginal bleeding. Pertinent negatives include abnormal bleeding (hematology), anxiety, decreased libido, depression, difficulty falling sleep, dyspareunia, history of infertility, nocturia, sexual dysfunction, sleep disturbances, urinary incontinence, urinary urgency, vaginal discharge and vaginal itching. Diet regular.The patient states her exercise level is  moderate.  . The patient's tobacco use is:  Social History   Tobacco Use  Smoking Status Former   Current packs/day: 0.00   Average packs/day: 1 pack/day for 20.0 years (20.0 ttl pk-yrs)   Types: Cigarettes   Start date: 28   Quit date: 34   Years since quitting: 35.4  Smokeless Tobacco Never  . She has been exposed to passive smoke. The patient's alcohol use is:  Social History   Substance and Sexual Activity  Alcohol Use Not Currently    Review of Systems  Constitutional: Negative.   HENT: Negative.    Eyes: Negative.  Negative for blurred vision.  Respiratory: Negative.     Cardiovascular: Negative.   Gastrointestinal: Negative.   Endocrine: Negative.   Genitourinary: Negative.   Musculoskeletal:  Positive for arthralgias.       She c/o right hip pain, denies recent fall  Skin: Negative.   Neurological: Negative.   Hematological: Negative.   Psychiatric/Behavioral: Negative.       Today's Vitals   11/29/23 1018  BP: 110/70  Pulse: 67  Temp: 98.2 F (36.8 C)  TempSrc: Oral  Weight: 155 lb (70.3 kg)  Height: 5\' 6"  (1.676 m)  PainSc: 4   PainLoc: Hip   Body mass index is 25.02 kg/m.  Wt Readings from Last 3 Encounters:  11/29/23 155 lb (70.3 kg)  05/03/23 162 lb 6.4 oz (73.7 kg)  04/28/23 157 lb 9.6 oz (71.5 kg)     Objective:  Physical Exam Vitals and nursing note reviewed.  Constitutional:  Appearance: Normal appearance.  HENT:     Head: Normocephalic and atraumatic.     Right Ear: Tympanic membrane, ear canal and external ear normal.     Left Ear: Tympanic membrane, ear canal and external ear normal.     Nose: Nose normal.     Mouth/Throat:     Mouth: Mucous membranes are moist.     Pharynx: Oropharynx is clear.  Eyes:     Extraocular Movements: Extraocular movements intact.     Conjunctiva/sclera: Conjunctivae normal.     Pupils: Pupils are equal, round, and reactive to light.  Cardiovascular:     Rate and Rhythm: Normal rate and regular rhythm.     Pulses: Normal pulses.          Dorsalis pedis pulses are 2+ on the right side and 2+ on the left side.     Heart sounds: Normal heart sounds.  Pulmonary:     Effort: Pulmonary effort is normal.     Breath sounds: Normal breath sounds.  Chest:  Breasts:    Tanner Score is 5.     Right: Normal.     Left: Normal.  Abdominal:     General: Abdomen is flat. Bowel sounds are normal.     Palpations: Abdomen is soft.  Genitourinary:    Comments: deferred Musculoskeletal:        General: Normal range of motion.     Cervical back: Normal range of motion and neck supple.   Skin:    General: Skin is warm and dry.  Neurological:     General: No focal deficit present.     Mental Status: She is alert and oriented to person, place, and time.  Psychiatric:        Mood and Affect: Mood normal.        Behavior: Behavior normal.         Assessment And Plan:     Encounter for general adult medical examination w/o abnormal findings Assessment & Plan: A full exam was performed.  Importance of monthly self breast exams was discussed with the patient.  She is advised to get 30-45 minutes of regular exercise, no less than four to five days per week. Both weight-bearing and aerobic exercises are recommended.  She is advised to follow a healthy diet with at least six fruits/veggies per day, decrease intake of red meat and other saturated fats and to increase fish intake to twice weekly.  Meats/fish should not be fried -- baked, boiled or broiled is preferable. It is also important to cut back on your sugar intake.  Be sure to read labels - try to avoid anything with added sugar, high fructose corn syrup or other sweeteners.  If you must use a sweetener, you can try stevia or monkfruit.  It is also important to avoid artificially sweetened foods/beverages and diet drinks. Lastly, wear SPF 50 sunscreen on exposed skin and when in direct sunlight for an extended period of time.  Be sure to avoid fast food restaurants and aim for at least 60 ounces of water daily.       Hypertensive heart disease without heart failure Assessment & Plan: Chronic, well controlled. EKG performed, NSR w/o acute changes. She will continue with hydrochlorothiazide  12.5mg  daily. Encouraged to follow low sodium diet.  - Follow up in six months - Increase intake of potassium rich foods  Orders: -     POCT URINALYSIS DIP (CLINITEK) -     Microalbumin / creatinine urine ratio -  EKG 12-Lead -     CBC -     CMP14+EGFR  Coronary artery calcification of native artery Assessment & Plan: Chronic,  LDL goal is less than 70. She will continue with atorvastatin  and ASA 81mg  daily. Encouraged to follow low sodium diet.    Right hip pain Assessment & Plan: Chronic right hip pain for six months, sx suggestive of bursitis. Previous sciatica-like pain absent. Meloxicam preferred due to tolerance. - Refilled meloxicam prescription, take daily for 7-10 days, then as needed. Advised to take with food - Contact Dr. Constancia Delton if no relief for further evaluation. - Consider imaging if symptoms persist.   Pure hypercholesterolemia Assessment & Plan: Chronic, LDL goal is less than 70.  She will continue with atorvastatin  10mg  daily. Encouraged to follow heart healthy lifestyle. She was commended on her regular exercise regimen.    Primary hypothyroidism Assessment & Plan: Chronic, well-managed on Synthroid  88 mcg daily, Monday through Saturday. -I will check thyroid  panel and adjust meds as needed.   Orders: -     TSH + free T4  Thrombocytosis Assessment & Plan: Chronic, evaluated by Hematology in 2018. She was found to NOT have essential thrombocythemia. It is felt this is reactive,related to her sickle cell trait. - No need for further Hematology evaluation at this time.   Vertigo -     Meclizine  HCl; TAKE 1 TABLET BY MOUTH THREE TIMES A DAY AS NEEDED FOR DIZZINESS  Other abnormal glucose -     CMP14+EGFR -     Hemoglobin A1c  Sickle-cell trait (HCC) -     Folic Acid ; Take 1 tablet (1 mg total) by mouth daily.  Dispense: 90 tablet; Refill: 1  Vitamin D deficiency -     VITAMIN D 25 Hydroxy (Vit-D Deficiency, Fractures)  Other orders -     Meloxicam; Take 1 tablet (15 mg total) by mouth daily.  Dispense: 30 tablet; Refill: 1   Return in about 6 months (around 05/30/2024), or bp check, for 1 year physical, bpc. Patient was given opportunity to ask questions. Patient verbalized understanding of the plan and was able to repeat key elements of the plan. All questions were answered  to their satisfaction.   I, Maria Dung, MD, have reviewed all documentation for this visit. The documentation on 11/29/23 for the exam, diagnosis, procedures, and orders are all accurate and complete.

## 2023-11-29 NOTE — Assessment & Plan Note (Signed)
 Chronic right hip pain for six months, sx suggestive of bursitis. Previous sciatica-like pain absent. Meloxicam preferred due to tolerance. - Refilled meloxicam prescription, take daily for 7-10 days, then as needed. Advised to take with food - Contact Dr. Constancia Delton if no relief for further evaluation. - Consider imaging if symptoms persist.

## 2023-11-29 NOTE — Assessment & Plan Note (Signed)

## 2023-11-29 NOTE — Assessment & Plan Note (Signed)
 Chronic, evaluated by Hematology in 2018. She was found to NOT have essential thrombocythemia. It is felt this is reactive,related to her sickle cell trait. - No need for further Hematology evaluation at this time.

## 2023-11-29 NOTE — Assessment & Plan Note (Signed)
 Chronic, LDL goal is less than 70.  She will continue with atorvastatin 10mg  daily. Encouraged to follow heart healthy lifestyle. She was commended on her regular exercise regimen.

## 2023-11-29 NOTE — Assessment & Plan Note (Addendum)
 Chronic, well controlled. EKG performed, NSR w/o acute changes. She will continue with hydrochlorothiazide  12.5mg  daily. Encouraged to follow low sodium diet.  - Follow up in six months - Increase intake of potassium rich foods

## 2023-11-29 NOTE — Patient Instructions (Signed)

## 2023-11-30 LAB — CBC
Hematocrit: 36 % (ref 34.0–46.6)
Hemoglobin: 11.1 g/dL (ref 11.1–15.9)
MCH: 28 pg (ref 26.6–33.0)
MCHC: 30.8 g/dL — ABNORMAL LOW (ref 31.5–35.7)
MCV: 91 fL (ref 79–97)
Platelets: 536 10*3/uL — ABNORMAL HIGH (ref 150–450)
RBC: 3.97 x10E6/uL (ref 3.77–5.28)
RDW: 13.2 % (ref 11.7–15.4)
WBC: 7.5 10*3/uL (ref 3.4–10.8)

## 2023-11-30 LAB — HEMOGLOBIN A1C
Est. average glucose Bld gHb Est-mCnc: 120 mg/dL
Hgb A1c MFr Bld: 5.8 % — ABNORMAL HIGH (ref 4.8–5.6)

## 2023-11-30 LAB — CMP14+EGFR
ALT: 15 IU/L (ref 0–32)
AST: 29 IU/L (ref 0–40)
Albumin: 4 g/dL (ref 3.8–4.8)
Alkaline Phosphatase: 62 IU/L (ref 44–121)
BUN/Creatinine Ratio: 9 — ABNORMAL LOW (ref 12–28)
BUN: 12 mg/dL (ref 8–27)
Bilirubin Total: 0.3 mg/dL (ref 0.0–1.2)
CO2: 26 mmol/L (ref 20–29)
Calcium: 10 mg/dL (ref 8.7–10.3)
Chloride: 103 mmol/L (ref 96–106)
Creatinine, Ser: 1.34 mg/dL — ABNORMAL HIGH (ref 0.57–1.00)
Globulin, Total: 2.3 g/dL (ref 1.5–4.5)
Glucose: 79 mg/dL (ref 70–99)
Potassium: 3.9 mmol/L (ref 3.5–5.2)
Sodium: 143 mmol/L (ref 134–144)
Total Protein: 6.3 g/dL (ref 6.0–8.5)
eGFR: 42 mL/min/{1.73_m2} — ABNORMAL LOW (ref >59)

## 2023-11-30 LAB — MICROALBUMIN / CREATININE URINE RATIO
Creatinine, Urine: 162.6 mg/dL
Microalb/Creat Ratio: 5 mg/g{creat} (ref 0–29)
Microalbumin, Urine: 8.6 ug/mL

## 2023-11-30 LAB — VITAMIN D 25 HYDROXY (VIT D DEFICIENCY, FRACTURES): Vit D, 25-Hydroxy: 76.7 ng/mL (ref 30.0–100.0)

## 2023-11-30 LAB — TSH+FREE T4
Free T4: 1.4 ng/dL (ref 0.82–1.77)
TSH: 0.527 u[IU]/mL (ref 0.450–4.500)

## 2023-12-06 DIAGNOSIS — F330 Major depressive disorder, recurrent, mild: Secondary | ICD-10-CM | POA: Diagnosis not present

## 2023-12-08 ENCOUNTER — Other Ambulatory Visit: Payer: Self-pay

## 2023-12-10 ENCOUNTER — Other Ambulatory Visit: Payer: Self-pay

## 2023-12-10 DIAGNOSIS — N289 Disorder of kidney and ureter, unspecified: Secondary | ICD-10-CM

## 2023-12-20 DIAGNOSIS — F330 Major depressive disorder, recurrent, mild: Secondary | ICD-10-CM | POA: Diagnosis not present

## 2023-12-21 ENCOUNTER — Other Ambulatory Visit: Payer: Self-pay

## 2023-12-21 DIAGNOSIS — N289 Disorder of kidney and ureter, unspecified: Secondary | ICD-10-CM | POA: Diagnosis not present

## 2023-12-22 DIAGNOSIS — M7061 Trochanteric bursitis, right hip: Secondary | ICD-10-CM | POA: Diagnosis not present

## 2023-12-22 LAB — BASIC METABOLIC PANEL WITH GFR
BUN/Creatinine Ratio: 8 — ABNORMAL LOW (ref 12–28)
BUN: 9 mg/dL (ref 8–27)
CO2: 22 mmol/L (ref 20–29)
Calcium: 10.5 mg/dL — ABNORMAL HIGH (ref 8.7–10.3)
Chloride: 104 mmol/L (ref 96–106)
Creatinine, Ser: 1.17 mg/dL — ABNORMAL HIGH (ref 0.57–1.00)
Glucose: 90 mg/dL (ref 70–99)
Potassium: 4.2 mmol/L (ref 3.5–5.2)
Sodium: 142 mmol/L (ref 134–144)
eGFR: 49 mL/min/{1.73_m2} — ABNORMAL LOW (ref >59)

## 2023-12-25 ENCOUNTER — Ambulatory Visit: Payer: Self-pay | Admitting: Internal Medicine

## 2023-12-27 ENCOUNTER — Other Ambulatory Visit: Payer: Self-pay | Admitting: Internal Medicine

## 2023-12-27 ENCOUNTER — Other Ambulatory Visit: Payer: Medicare HMO

## 2023-12-27 DIAGNOSIS — E2839 Other primary ovarian failure: Secondary | ICD-10-CM

## 2023-12-28 ENCOUNTER — Other Ambulatory Visit: Payer: Self-pay | Admitting: Internal Medicine

## 2023-12-29 DIAGNOSIS — H1045 Other chronic allergic conjunctivitis: Secondary | ICD-10-CM | POA: Diagnosis not present

## 2023-12-29 DIAGNOSIS — H2513 Age-related nuclear cataract, bilateral: Secondary | ICD-10-CM | POA: Diagnosis not present

## 2024-01-03 DIAGNOSIS — F330 Major depressive disorder, recurrent, mild: Secondary | ICD-10-CM | POA: Diagnosis not present

## 2024-01-17 DIAGNOSIS — F330 Major depressive disorder, recurrent, mild: Secondary | ICD-10-CM | POA: Diagnosis not present

## 2024-01-19 ENCOUNTER — Other Ambulatory Visit: Payer: Self-pay | Admitting: Internal Medicine

## 2024-01-19 DIAGNOSIS — M7061 Trochanteric bursitis, right hip: Secondary | ICD-10-CM | POA: Diagnosis not present

## 2024-01-24 ENCOUNTER — Other Ambulatory Visit: Payer: Self-pay | Admitting: Internal Medicine

## 2024-01-24 DIAGNOSIS — R42 Dizziness and giddiness: Secondary | ICD-10-CM

## 2024-01-28 DIAGNOSIS — M545 Low back pain, unspecified: Secondary | ICD-10-CM | POA: Diagnosis not present

## 2024-02-07 DIAGNOSIS — F330 Major depressive disorder, recurrent, mild: Secondary | ICD-10-CM | POA: Diagnosis not present

## 2024-02-10 ENCOUNTER — Telehealth: Admitting: Physician Assistant

## 2024-02-10 DIAGNOSIS — J208 Acute bronchitis due to other specified organisms: Secondary | ICD-10-CM

## 2024-02-10 DIAGNOSIS — B9689 Other specified bacterial agents as the cause of diseases classified elsewhere: Secondary | ICD-10-CM

## 2024-02-10 MED ORDER — BENZONATATE 100 MG PO CAPS: 100.0000 mg | ORAL_CAPSULE | Freq: Three times a day (TID) | ORAL | 0 refills | Status: AC | PRN

## 2024-02-10 MED ORDER — DOXYCYCLINE HYCLATE 100 MG PO TABS: 100.0000 mg | ORAL_TABLET | Freq: Two times a day (BID) | ORAL | 0 refills | Status: DC

## 2024-02-10 NOTE — Progress Notes (Signed)

## 2024-02-10 NOTE — Progress Notes (Signed)
 I have spent 5 minutes in review of e-visit questionnaire, review and updating patient chart, medical decision making and response to patient.   Elsie Velma Lunger, PA-C

## 2024-02-15 ENCOUNTER — Other Ambulatory Visit: Payer: Self-pay

## 2024-02-15 DIAGNOSIS — N289 Disorder of kidney and ureter, unspecified: Secondary | ICD-10-CM | POA: Diagnosis not present

## 2024-02-16 ENCOUNTER — Ambulatory Visit: Payer: Self-pay | Admitting: Internal Medicine

## 2024-02-16 LAB — BMP8+EGFR
BUN/Creatinine Ratio: 13 (ref 12–28)
BUN: 16 mg/dL (ref 8–27)
CO2: 24 mmol/L (ref 20–29)
Calcium: 10.6 mg/dL — ABNORMAL HIGH (ref 8.7–10.3)
Chloride: 102 mmol/L (ref 96–106)
Creatinine, Ser: 1.24 mg/dL — ABNORMAL HIGH (ref 0.57–1.00)
Glucose: 77 mg/dL (ref 70–99)
Potassium: 4 mmol/L (ref 3.5–5.2)
Sodium: 140 mmol/L (ref 134–144)
eGFR: 46 mL/min/1.73 — ABNORMAL LOW (ref >59)

## 2024-02-17 ENCOUNTER — Other Ambulatory Visit: Payer: Self-pay

## 2024-02-24 ENCOUNTER — Other Ambulatory Visit: Payer: Self-pay

## 2024-02-28 LAB — PROTEIN ELECTROPHORESIS, SERUM
A/G Ratio: 1.1 (ref 0.7–1.7)
Albumin ELP: 3.2 g/dL (ref 2.9–4.4)
Alpha 1: 0.2 g/dL (ref 0.0–0.4)
Alpha 2: 0.6 g/dL (ref 0.4–1.0)
Beta: 1 g/dL (ref 0.7–1.3)
Gamma Globulin: 1.2 g/dL (ref 0.4–1.8)
Globulin, Total: 3 g/dL (ref 2.2–3.9)
Total Protein: 6.2 g/dL (ref 6.0–8.5)

## 2024-02-28 LAB — PTH, INTACT AND CALCIUM
Calcium: 10.4 mg/dL — ABNORMAL HIGH (ref 8.7–10.3)
PTH: 29 pg/mL (ref 15–65)

## 2024-02-29 ENCOUNTER — Ambulatory Visit: Payer: Self-pay | Admitting: Internal Medicine

## 2024-03-06 DIAGNOSIS — F330 Major depressive disorder, recurrent, mild: Secondary | ICD-10-CM | POA: Diagnosis not present

## 2024-03-10 DIAGNOSIS — M48062 Spinal stenosis, lumbar region with neurogenic claudication: Secondary | ICD-10-CM | POA: Diagnosis not present

## 2024-03-14 ENCOUNTER — Ambulatory Visit (HOSPITAL_BASED_OUTPATIENT_CLINIC_OR_DEPARTMENT_OTHER)
Admission: RE | Admit: 2024-03-14 | Discharge: 2024-03-14 | Disposition: A | Discharge status: Home / Self Care | Source: Ambulatory Visit | Attending: Internal Medicine | Admitting: Internal Medicine

## 2024-03-14 DIAGNOSIS — M85851 Other specified disorders of bone density and structure, right thigh: Secondary | ICD-10-CM | POA: Diagnosis not present

## 2024-03-14 DIAGNOSIS — Z780 Asymptomatic menopausal state: Secondary | ICD-10-CM | POA: Diagnosis not present

## 2024-03-14 DIAGNOSIS — E2839 Other primary ovarian failure: Secondary | ICD-10-CM | POA: Diagnosis not present

## 2024-03-18 ENCOUNTER — Ambulatory Visit: Payer: Self-pay | Admitting: Internal Medicine

## 2024-03-20 DIAGNOSIS — F330 Major depressive disorder, recurrent, mild: Secondary | ICD-10-CM | POA: Diagnosis not present

## 2024-03-26 ENCOUNTER — Other Ambulatory Visit: Payer: Self-pay | Admitting: Cardiology

## 2024-03-29 DIAGNOSIS — F330 Major depressive disorder, recurrent, mild: Secondary | ICD-10-CM | POA: Diagnosis not present

## 2024-04-03 DIAGNOSIS — F330 Major depressive disorder, recurrent, mild: Secondary | ICD-10-CM | POA: Diagnosis not present

## 2024-04-11 DIAGNOSIS — M7061 Trochanteric bursitis, right hip: Secondary | ICD-10-CM | POA: Diagnosis not present

## 2024-04-17 DIAGNOSIS — F330 Major depressive disorder, recurrent, mild: Secondary | ICD-10-CM | POA: Diagnosis not present

## 2024-04-18 ENCOUNTER — Other Ambulatory Visit: Payer: Self-pay | Admitting: Internal Medicine

## 2024-05-01 DIAGNOSIS — F330 Major depressive disorder, recurrent, mild: Secondary | ICD-10-CM | POA: Diagnosis not present

## 2024-05-15 DIAGNOSIS — F330 Major depressive disorder, recurrent, mild: Secondary | ICD-10-CM | POA: Diagnosis not present

## 2024-05-25 ENCOUNTER — Telehealth: Payer: Self-pay | Admitting: Cardiology

## 2024-05-25 MED ORDER — FENOFIBRATE 145 MG PO TABS: 145.0000 mg | ORAL_TABLET | Freq: Every day | ORAL | 0 refills | Status: AC

## 2024-05-25 NOTE — Telephone Encounter (Signed)
 Rx sent to pharmacy

## 2024-05-25 NOTE — Telephone Encounter (Signed)
*  STAT* If patient is at the pharmacy, call can be transferred to refill team.   1. Which medications need to be refilled? (please list name of each medication and dose if known)   fenofibrate  (TRICOR ) 145 MG tablet     2. Would you like to learn more about the convenience, safety, & potential cost savings by using the [facility]? No    3. Are you open to using the [facility] (Type [facility]. No    4. Which pharmacy/location (including street and city if local pharmacy) is medication to be sent to? [id number] - [place],  - [address]    5. Do they need a 30 day or 90 day supply? 90 day   Pt is out of medication.  Pt has appt 08/22/24.

## 2024-05-29 DIAGNOSIS — F330 Major depressive disorder, recurrent, mild: Secondary | ICD-10-CM | POA: Diagnosis not present

## 2024-05-30 ENCOUNTER — Ambulatory Visit: Payer: Self-pay | Admitting: Internal Medicine

## 2024-05-30 ENCOUNTER — Encounter: Payer: Self-pay | Admitting: Internal Medicine

## 2024-05-30 VITALS — BP 110/62 | HR 57 | Temp 98.1°F | Ht 66.0 in | Wt 147.4 lb

## 2024-05-30 DIAGNOSIS — I2510 Atherosclerotic heart disease of native coronary artery without angina pectoris: Secondary | ICD-10-CM | POA: Diagnosis not present

## 2024-05-30 DIAGNOSIS — H6121 Impacted cerumen, right ear: Secondary | ICD-10-CM

## 2024-05-30 DIAGNOSIS — I2584 Coronary atherosclerosis due to calcified coronary lesion: Secondary | ICD-10-CM | POA: Diagnosis not present

## 2024-05-30 DIAGNOSIS — R9389 Abnormal findings on diagnostic imaging of other specified body structures: Secondary | ICD-10-CM | POA: Diagnosis not present

## 2024-05-30 DIAGNOSIS — R42 Dizziness and giddiness: Secondary | ICD-10-CM

## 2024-05-30 DIAGNOSIS — M8580 Other specified disorders of bone density and structure, unspecified site: Secondary | ICD-10-CM | POA: Diagnosis not present

## 2024-05-30 DIAGNOSIS — R7303 Prediabetes: Secondary | ICD-10-CM | POA: Diagnosis not present

## 2024-05-30 DIAGNOSIS — E039 Hypothyroidism, unspecified: Secondary | ICD-10-CM

## 2024-05-30 DIAGNOSIS — I10 Essential (primary) hypertension: Secondary | ICD-10-CM

## 2024-05-30 DIAGNOSIS — E7800 Pure hypercholesterolemia, unspecified: Secondary | ICD-10-CM

## 2024-05-30 DIAGNOSIS — I119 Hypertensive heart disease without heart failure: Secondary | ICD-10-CM | POA: Diagnosis not present

## 2024-05-30 DIAGNOSIS — D75839 Thrombocytosis, unspecified: Secondary | ICD-10-CM | POA: Diagnosis not present

## 2024-05-30 MED ORDER — MECLIZINE HCL 12.5 MG PO TABS: ORAL_TABLET | ORAL | 1 refills | Status: AC

## 2024-05-30 MED ORDER — TRAZODONE HCL 100 MG PO TABS: ORAL_TABLET | ORAL | 1 refills | Status: AC

## 2024-05-30 NOTE — Progress Notes (Signed)
 I,Maria Hall, CMA,acting as a neurosurgeon for Maria LOISE Slocumb, MD.,have documented all relevant documentation on the behalf of Maria LOISE Slocumb, MD,as directed by  Maria LOISE Slocumb, MD while in the presence of Maria LOISE Slocumb, MD.  Subjective:  Patient ID: Maria Hall , female    DOB: [birth year] , 73 y.o.   MRN: [id number]  Chief Complaint  Patient presents with   Hypertension    Patient presents today for blood pressure check. Patient has been compliant with all medications. Patient denies any SOB, headaches, or chest pain at this time. Patient has no other question or concerns today.    HPI Discussed the use of AI scribe software for clinical note transcription with the patient, who gave verbal consent to proceed.  History of Present Illness EDYE HAINLINE is a 73 year old female who presents for a blood pressure check and lab work.  She is currently taking aspirin and atorvastatin  10 mg for cholesterol management, levothyroxine  88 mcg from Monday to Friday for thyroid  management, and trazodone  as needed for sleep. She takes meclizine  when she feels dizziness coming on, although dizziness is infrequent.  She experienced dizziness about twice last week, which she associates with vertigo. She describes a sensation where her eyes feel 'fixed' and she feels like she is staring unintentionally. This symptom was initially noted during a visit to the emergency department. She has not seen an ear, nose, and throat specialist for this issue.  She drinks a lot of water, including Crystal Light, and has not experienced palpitations. Her past medical history includes sickle cell trait and osteopenia. She has a history of elevated platelets, which have fluctuated in the past. She has been to a cardiologist and is scheduled for a follow-up this month.   Hypertension This is a chronic problem. The current episode started more than 1 year ago. The problem has been gradually improving since  onset. The problem is controlled. Pertinent negatives include no blurred vision or peripheral edema. Past treatments include diuretics. The current treatment provides moderate improvement. There are no compliance problems.      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   Bronchitis    gets every year in the winter   Insomnia    takes meds   Right rotator cuff tear    Swelling    in hands and ankles   Thyroid  disease      Family History  Adopted: Yes     Current Outpatient Medications:    Ascorbic Acid (VITAMIN C) 1000 MG tablet, Take 1,000 mg by mouth daily., Disp: , Rfl:    aspirin EC 81 MG tablet, Take 81 mg by mouth daily., Disp: , Rfl:    atorvastatin  (LIPITOR) 10 MG tablet, TAKE 1 TABLET BY MOUTH EVERY DAY, Disp: 90 tablet, Rfl: 3   benzonatate  (TESSALON ) 100 MG capsule, Take 1 capsule (100 mg total) by mouth 3 (three) times daily as needed for cough., Disp: 30 capsule, Rfl: 0   cholecalciferol (VITAMIN D ) 1000 units tablet, Take 1,000 Units by mouth daily., Disp: , Rfl:    Docusate Calcium  (STOOL SOFTENER PO), Take 100 mg by mouth. Take 2 soft gels daily, Disp: , Rfl:    fenofibrate  (TRICOR ) 145 MG tablet, Take 1 tablet (145 mg total) by mouth daily., Disp: 90 tablet, Rfl: 0   folic acid  (FOLVITE ) 1 MG tablet, Take 1 tablet (1 mg total) by mouth daily., Disp:  90 tablet, Rfl: 1   hydrochlorothiazide  (HYDRODIURIL ) 12.5 MG tablet, TAKE 1 TABLET BY MOUTH EVERY DAY, Disp: 90 tablet, Rfl: 1   levothyroxine  (SYNTHROID ) 88 MCG tablet, TAKE 1 TABLET (88 MCG TOTAL) BY MOUTH DAILY BEFORE BREAKFAST. MON - SAT ONLY, Disp: 78 tablet, Rfl: 1   Omega 3-6-9 Fatty Acids (OMEGA 3-6-9 COMPLEX PO), Take 1,600 mg by mouth daily at 6 (six) AM., Disp: , Rfl:    vitamin E 400 UNIT capsule, Take 400 Units by mouth daily., Disp: , Rfl:    DULoxetine (CYMBALTA) 30 MG capsule, Take 30 mg by mouth 2 (two) times daily. (Patient not taking: Reported on 05/30/2024), Disp: ,  Rfl:    meclizine  (ANTIVERT ) 12.5 MG tablet, TAKE 1 TABLET BY MOUTH UP TO 3 TIMES A DAY AS NEEDED FOR DIZZINESS, Disp: 30 tablet, Rfl: 1   traZODone  (DESYREL ) 100 MG tablet, One tab po qpm, Disp: 90 tablet, Rfl: 1   No Known Allergies   Review of Systems  Constitutional: Negative.   Eyes:  Negative for blurred vision.  Respiratory: Negative.    Cardiovascular: Negative.   Neurological:  Positive for dizziness.  Psychiatric/Behavioral: Negative.       Today's Vitals   05/30/24 1051  BP: 110/62  Pulse: (!) 57  Temp: 98.1 F (36.7 C)  SpO2: (!) 80%  Weight: 147 lb 6.4 oz (66.9 kg)  Height: 5' 6 (1.676 m)   Body mass index is 23.79 kg/m.  Wt Readings from Last 3 Encounters:  05/30/24 147 lb 6.4 oz (66.9 kg)  11/29/23 155 lb (70.3 kg)  05/03/23 162 lb 6.4 oz (73.7 kg)    The 10-year ASCVD risk score (Arnett DK, et al., 2019) is: 8.8%   Values used to calculate the score:     Age: 81 years     Clinically relevant sex: Female     Is Non-Hispanic African American: Yes     Diabetic: No     Tobacco smoker: No     Systolic Blood Pressure: 110 mmHg     Is BP treated: Yes     HDL Cholesterol: 56 mg/dL     Total Cholesterol: 136 mg/dL  Objective:  Physical Exam Vitals and nursing note reviewed.  Constitutional:      Appearance: Normal appearance.  HENT:     Head: Normocephalic and atraumatic.     Right Ear: Ear canal and external ear normal. There is impacted cerumen.     Left Ear: Tympanic membrane, ear canal and external ear normal.  Cardiovascular:     Rate and Rhythm: Normal rate and regular rhythm.     Heart sounds: Normal heart sounds.  Pulmonary:     Effort: Pulmonary effort is normal.     Breath sounds: Normal breath sounds.  Skin:    General: Skin is warm.  Neurological:     General: No focal deficit present.     Mental Status: She is alert.  Psychiatric:        Mood and Affect: Mood normal.        Behavior: Behavior normal.         Assessment And  Plan:   Assessment & Plan Hypertensive heart disease without heart failure Chronic, well controlled. She will continue with hydrochlorothiazide  12.5mg  daily. Encouraged to follow low sodium diet.  - Follow up in six months - Increase intake of potassium rich foods. Coronary artery calcification of native artery Chronic, LDL goal is less than 70. She will continue with atorvastatin   and ASA 81mg  daily. Encouraged to follow low sodium diet.  Primary hypothyroidism Levothyroxine  taken Monday through Friday. Lab work ordered to monitor thyroid  function. - Ordered lab work to check thyroid  function. Pure hypercholesterolemia Chronic. She will continue with atorvastatin  daily.  - Follow heart healthy lifestyle.  Vertigo Intermittent dizziness with atypical episodes. Differential includes ear wax impaction. No recent ENT evaluation. - Ordered cardiac monitor to evaluate for arrhythmia. - Flushed right ear to remove impacted cerumen. Right ear impacted cerumen AFTER OBTAINING VERBAL CONSENT, RIGHT EAR WAS FLUSHED BY IRRIGATION. SHE TOLERATED PROCEDURE WELL WITHOUT ANY COMPLICATIONS. NO TM ABNORMALITIES WERE NOTED.  Thrombocytosis Chronic, most recent platelet count was 536 in June 2025. I will recheck CBC today.  Osteopenia, unspecified location Confirmed by recent bone density scan. Emphasis on weight-bearing exercises and supplementation. - Encouraged weight-bearing exercises such as walking and strength training. - Ensured adequate intake of calcium  and vitamin D . - Will recheck bone density in two years. Prediabetes Previous labs reviewed, her A1c has been elevated in the past. I will check an A1c today. Reminded to avoid refined sugars including sugary drinks/foods and processed meats including bacon, sausages and deli meats.   Abnormal chest CT Mild area of reticulation and groundglass noted on CT July 2024.  She is in agreement with repeat HRCT to assess for change.     Orders  Placed This Encounter  Procedures   CT Chest High Resolution   CBC   CMP14+EGFR   Lipid panel   Hemoglobin A1c   LONG TERM MONITOR XT (3-14 DAYS)   Ear Lavage     Return if symptoms worsen or fail to improve.  Patient was given opportunity to ask questions. Patient verbalized understanding of the plan and was able to repeat key elements of the plan. All questions were answered to their satisfaction.    I, Maria LOISE Slocumb, MD, have reviewed all documentation for this visit. The documentation on 06/05/2024 for the exam, diagnosis, procedures, and orders are all accurate and complete.   IF YOU HAVE BEEN REFERRED TO A SPECIALIST, IT MAY TAKE 1-2 WEEKS TO SCHEDULE/PROCESS THE REFERRAL. IF YOU HAVE NOT HEARD FROM US /SPECIALIST IN TWO WEEKS, PLEASE GIVE US  A CALL AT [phone].

## 2024-05-30 NOTE — Patient Instructions (Signed)
 Hypertension, Adult Hypertension is another name for high blood pressure. High blood pressure forces your heart to work harder to pump blood. This can cause problems over time. There are two numbers in a blood pressure reading. There is a top number (systolic) over a bottom number (diastolic). It is best to have a blood pressure that is below 120/80. What are the causes? The cause of this condition is not known. Some other conditions can lead to high blood pressure. What increases the risk? Some lifestyle factors can make you more likely to develop high blood pressure: Smoking. Not getting enough exercise or physical activity. Being overweight. Having too much fat, sugar, calories, or salt (sodium) in your diet. Drinking too much alcohol. Other risk factors include: Having any of these conditions: Heart disease. Diabetes. High cholesterol. Kidney disease. Obstructive sleep apnea. Having a family history of high blood pressure and high cholesterol. Age. The risk increases with age. Stress. What are the signs or symptoms? High blood pressure may not cause symptoms. Very high blood pressure (hypertensive crisis) may cause: Headache. Fast or uneven heartbeats (palpitations). Shortness of breath. Nosebleed. Vomiting or feeling like you may vomit (nauseous). Changes in how you see. Very bad chest pain. Feeling dizzy. Seizures. How is this treated? This condition is treated by making healthy lifestyle changes, such as: Eating healthy foods. Exercising more. Drinking less alcohol. Your doctor may prescribe medicine if lifestyle changes do not help enough and if: Your top number is above 130. Your bottom number is above 80. Your personal target blood pressure may vary. Follow these instructions at home: Eating and drinking  If told, follow the DASH eating plan. To follow this plan: Fill one half of your plate at each meal with fruits and vegetables. Fill one fourth of your plate  at each meal with whole grains. Whole grains include whole-wheat pasta, brown rice, and whole-grain bread. Eat or drink low-fat dairy products, such as skim milk or low-fat yogurt. Fill one fourth of your plate at each meal with low-fat (lean) proteins. Low-fat proteins include fish, chicken without skin, eggs, beans, and tofu. Avoid fatty meat, cured and processed meat, or chicken with skin. Avoid pre-made or processed food. Limit the amount of salt in your diet to less than 1,500 mg each day. Do not drink alcohol if: Your doctor tells you not to drink. You are pregnant, may be pregnant, or are planning to become pregnant. If you drink alcohol: Limit how much you have to: 0-1 drink a day for women. 0-2 drinks a day for men. Know how much alcohol is in your drink. In the [place] one drink equals one 12 oz bottle of beer (355 mL), one 5 oz glass of wine (148 mL), or one 1 oz glass of hard liquor (44 mL). Lifestyle  Work with your doctor to stay at a healthy weight or to lose weight. Ask your doctor what the best weight is for you. Get at least 30 minutes of exercise that causes your heart to beat faster (aerobic exercise) most days of the week. This may include walking, swimming, or biking. Get at least 30 minutes of exercise that strengthens your muscles (resistance exercise) at least 3 days a week. This may include lifting weights or doing Pilates. Do not smoke or use any products that contain nicotine or tobacco. If you need help quitting, ask your doctor. Check your blood pressure at home as told by your doctor. Keep all follow-up visits. Medicines Take over-the-counter and prescription medicines  only as told by your doctor. Follow directions carefully. Do not skip doses of blood pressure medicine. The medicine does not work as well if you skip doses. Skipping doses also puts you at risk for problems. Ask your doctor about side effects or reactions to medicines that you should watch  for. Contact a doctor if: You think you are having a reaction to the medicine you are taking. You have headaches that keep coming back. You feel dizzy. You have swelling in your ankles. You have trouble with your vision. Get help right away if: You get a very bad headache. You start to feel mixed up (confused). You feel weak or numb. You feel faint. You have very bad pain in your: Chest. Belly (abdomen). You vomit more than once. You have trouble breathing. These symptoms may be an emergency. Get help right away. Call 911. Do not wait to see if the symptoms will go away. Do not drive yourself to the hospital. Summary Hypertension is another name for high blood pressure. High blood pressure forces your heart to work harder to pump blood. For most people, a normal blood pressure is less than 120/80. Making healthy choices can help lower blood pressure. If your blood pressure does not get lower with healthy choices, you may need to take medicine. This information is not intended to replace advice given to you by your health care provider. Make sure you discuss any questions you have with your health care provider. Document Revised: 03/27/2021 Document Reviewed: 03/27/2021 Elsevier Patient Education  2024 [employer].

## 2024-05-31 LAB — CMP14+EGFR
ALT: 18 IU/L (ref 0–32)
AST: 32 IU/L (ref 0–40)
Albumin: 4.2 g/dL (ref 3.8–4.8)
Alkaline Phosphatase: 70 IU/L (ref 49–135)
BUN/Creatinine Ratio: 13 (ref 12–28)
BUN: 13 mg/dL (ref 8–27)
Bilirubin Total: 0.3 mg/dL (ref 0.0–1.2)
CO2: 29 mmol/L (ref 20–29)
Calcium: 10.2 mg/dL (ref 8.7–10.3)
Chloride: 101 mmol/L (ref 96–106)
Creatinine, Ser: 1.01 mg/dL — ABNORMAL HIGH (ref 0.57–1.00)
Globulin, Total: 2.6 g/dL (ref 1.5–4.5)
Glucose: 79 mg/dL (ref 70–99)
Potassium: 4.7 mmol/L (ref 3.5–5.2)
Sodium: 140 mmol/L (ref 134–144)
Total Protein: 6.8 g/dL (ref 6.0–8.5)
eGFR: 58 mL/min/1.73 — ABNORMAL LOW (ref >59)

## 2024-05-31 LAB — CBC
Hematocrit: 38.7 % (ref 34.0–46.6)
Hemoglobin: 12.2 g/dL (ref 11.1–15.9)
MCH: 28.2 pg (ref 26.6–33.0)
MCHC: 31.5 g/dL (ref 31.5–35.7)
MCV: 89 fL (ref 79–97)
Platelets: 534 x10E3/uL — ABNORMAL HIGH (ref 150–450)
RBC: 4.33 x10E6/uL (ref 3.77–5.28)
RDW: 13.6 % (ref 11.7–15.4)
WBC: 6.9 x10E3/uL (ref 3.4–10.8)

## 2024-05-31 LAB — LIPID PANEL
Chol/HDL Ratio: 2.4 ratio (ref 0.0–4.4)
Cholesterol, Total: 136 mg/dL (ref 100–199)
HDL: 56 mg/dL (ref >39)
LDL Chol Calc (NIH): 64 mg/dL (ref 0–99)
Triglycerides: 81 mg/dL (ref 0–149)
VLDL Cholesterol Cal: 16 mg/dL (ref 5–40)

## 2024-05-31 LAB — HEMOGLOBIN A1C
Est. average glucose Bld gHb Est-mCnc: 120 mg/dL
Hgb A1c MFr Bld: 5.8 % — ABNORMAL HIGH (ref 4.8–5.6)

## 2024-06-05 DIAGNOSIS — M8580 Other specified disorders of bone density and structure, unspecified site: Secondary | ICD-10-CM | POA: Insufficient documentation

## 2024-06-05 NOTE — Assessment & Plan Note (Addendum)
 Chronic, LDL goal is less than 70. She will continue with atorvastatin and ASA 81mg  daily. Encouraged to follow low sodium diet.

## 2024-06-05 NOTE — Assessment & Plan Note (Signed)
 Levothyroxine  taken Monday through Friday. Lab work ordered to monitor thyroid  function. - Ordered lab work to check thyroid  function.

## 2024-06-05 NOTE — Assessment & Plan Note (Signed)
 Chronic, most recent platelet count was 536 in June 2025. I will recheck CBC today.

## 2024-06-05 NOTE — Assessment & Plan Note (Signed)
 Intermittent dizziness with atypical episodes. Differential includes ear wax impaction. No recent ENT evaluation. - Ordered cardiac monitor to evaluate for arrhythmia. - Flushed right ear to remove impacted cerumen.

## 2024-06-05 NOTE — Assessment & Plan Note (Signed)
 Chronic, well controlled. She will continue with hydrochlorothiazide  12.5mg  daily. Encouraged to follow low sodium diet.  - Follow up in six months - Increase intake of potassium rich foods.

## 2024-06-05 NOTE — Assessment & Plan Note (Signed)
 Confirmed by recent bone density scan. Emphasis on weight-bearing exercises and supplementation. - Encouraged weight-bearing exercises such as walking and strength training. - Ensured adequate intake of calcium  and vitamin D . - Will recheck bone density in two years.

## 2024-06-05 NOTE — Assessment & Plan Note (Signed)
 Chronic. She will continue with atorvastatin  daily.  - Follow heart healthy lifestyle.

## 2024-06-06 ENCOUNTER — Ambulatory Visit: Attending: Internal Medicine

## 2024-06-06 DIAGNOSIS — R42 Dizziness and giddiness: Secondary | ICD-10-CM

## 2024-06-06 NOTE — Progress Notes (Unsigned)
 Enrolled for [employer] to mail a ZIO XT long term holter monitor to the patients address on file.   EP to read

## 2024-06-13 ENCOUNTER — Ambulatory Visit
Admission: RE | Admit: 2024-06-13 | Discharge: 2024-06-13 | Disposition: A | Discharge status: Home / Self Care | Source: Ambulatory Visit | Attending: Internal Medicine | Admitting: Internal Medicine

## 2024-06-13 DIAGNOSIS — J849 Interstitial pulmonary disease, unspecified: Secondary | ICD-10-CM | POA: Diagnosis not present

## 2024-06-13 DIAGNOSIS — J479 Bronchiectasis, uncomplicated: Secondary | ICD-10-CM | POA: Diagnosis not present

## 2024-06-13 DIAGNOSIS — R9389 Abnormal findings on diagnostic imaging of other specified body structures: Secondary | ICD-10-CM

## 2024-06-16 ENCOUNTER — Ambulatory Visit: Payer: Self-pay | Admitting: Internal Medicine

## 2024-06-16 DIAGNOSIS — J849 Interstitial pulmonary disease, unspecified: Secondary | ICD-10-CM | POA: Insufficient documentation

## 2024-06-16 DIAGNOSIS — N1831 Chronic kidney disease, stage 3a: Secondary | ICD-10-CM

## 2024-06-28 ENCOUNTER — Ambulatory Visit
Admission: RE | Admit: 2024-06-28 | Discharge: 2024-06-28 | Disposition: A | Discharge status: Home / Self Care | Source: Ambulatory Visit | Attending: Internal Medicine | Admitting: Internal Medicine

## 2024-06-28 DIAGNOSIS — N1831 Chronic kidney disease, stage 3a: Secondary | ICD-10-CM

## 2024-07-02 DIAGNOSIS — R42 Dizziness and giddiness: Secondary | ICD-10-CM

## 2024-07-06 ENCOUNTER — Ambulatory Visit: Payer: Self-pay | Admitting: Internal Medicine

## 2024-07-11 ENCOUNTER — Ambulatory Visit: Admitting: Pulmonary Disease

## 2024-07-11 ENCOUNTER — Encounter: Payer: Self-pay | Admitting: Pulmonary Disease

## 2024-07-11 VITALS — BP 112/72 | HR 68 | Temp 97.6°F | Ht 66.0 in | Wt 145.0 lb

## 2024-07-11 DIAGNOSIS — J849 Interstitial pulmonary disease, unspecified: Secondary | ICD-10-CM | POA: Diagnosis not present

## 2024-07-11 DIAGNOSIS — Z87891 Personal history of nicotine dependence: Secondary | ICD-10-CM

## 2024-07-11 NOTE — Patient Instructions (Addendum)
" °  ° °  VISIT SUMMARY: Today, we evaluated your interstitial lung disease. Your condition remains mild with no significant changes. We discussed potential causes and next steps for monitoring and treatment.  YOUR PLAN: INTERSTITIAL LUNG DISEASE: Mild interstitial lung disease identified on CT scan with no significant change in severity. Possible causes include exposure-related, autoimmune diseases, or idiopathic pulmonary fibrosis (IPF). -Ordered blood tests to evaluate for autoimmune diseases and exposures. -Scheduled follow-up appointment in 2-3 months for lung function tests. -Encouraged aerobic exercise to maintain lung health. -Will discuss potential antifibrotic medications if autoimmune causes are ruled out and condition progresses.    Contains text generated by [employer].   "

## 2024-07-11 NOTE — Progress Notes (Signed)
 "              Maria Hall    [id number]    [birth year]  Primary Care Physician:Sanders, Catheryn, MD  Referring Physician: Jarold Catheryn, MD [address] [place],  [place] [address]  Chief complaint: Consult for interstitial lung disease  HPI: 74 y.o. who  has a past medical history of Arthritis, Bronchitis, Insomnia, Right rotator cuff tear, Swelling, and Thyroid  disease.  Discussed the use of AI scribe software for clinical note transcription with the patient, who gave verbal consent to proceed.  History of Present Illness Maria Hall is a 74 year old female with interstitial lung disease who presents for evaluation of her condition. She is accompanied by her daughter. She was referred by her primary care doctor for a abnormal high resolution CT scan in December.  Pulmonary symptoms and interstitial lung disease - Interstitial lung disease diagnosed prior to visit - Occasional shortness of breath - No cough, wheezing, or congestion - Referred for high resolution CT scan in December  Renal disease - Stage 3 chronic kidney disease  Musculoskeletal symptoms - Hip arthritis  Cardiac evaluation - Prior cardiac monitoring was normal   Relevant Pulmonary history: Pets: Dogs Occupation: Worked in a health and safety inspector job for [employer] Exposures: No mold, hot tub, Financial Controller.  No feather pillows or comforters No h/o chemo/XRT/amiodarone/macrodantin/MTX  No exposure to asbestos, silica or other organic allergens  Smoking history: 20-pack-year smoker.  Quit in 1990 Travel history: Originally from [place] .  No significant recent travel Family history: No family history of lung disease   Outpatient Encounter Medications as of 07/11/2024  Medication Sig   Ascorbic Acid (VITAMIN C) 1000 MG tablet Take 1,000 mg by mouth daily.   aspirin EC 81 MG tablet Take 81 mg by mouth daily.   atorvastatin  (LIPITOR) 10 MG tablet TAKE 1 TABLET BY MOUTH EVERY  DAY   benzonatate  (TESSALON ) 100 MG capsule Take 1 capsule (100 mg total) by mouth 3 (three) times daily as needed for cough.   cholecalciferol (VITAMIN D ) 1000 units tablet Take 1,000 Units by mouth daily.   Docusate Calcium  (STOOL SOFTENER PO) Take 100 mg by mouth. Take 2 soft gels daily   DULoxetine (CYMBALTA) 30 MG capsule Take 30 mg by mouth 2 (two) times daily. (Patient not taking: Reported on 05/30/2024)   fenofibrate  (TRICOR ) 145 MG tablet Take 1 tablet (145 mg total) by mouth daily.   folic acid  (FOLVITE ) 1 MG tablet Take 1 tablet (1 mg total) by mouth daily.   hydrochlorothiazide  (HYDRODIURIL ) 12.5 MG tablet TAKE 1 TABLET BY MOUTH EVERY DAY   levothyroxine  (SYNTHROID ) 88 MCG tablet TAKE 1 TABLET (88 MCG TOTAL) BY MOUTH DAILY BEFORE BREAKFAST. MON - SAT ONLY   meclizine  (ANTIVERT ) 12.5 MG tablet TAKE 1 TABLET BY MOUTH UP TO 3 TIMES A DAY AS NEEDED FOR DIZZINESS   Omega 3-6-9 Fatty Acids (OMEGA 3-6-9 COMPLEX PO) Take 1,600 mg by mouth daily at 6 (six) AM.   traZODone  (DESYREL ) 100 MG tablet One tab po qpm   vitamin E 400 UNIT capsule Take 400 Units by mouth daily.   No facility-administered encounter medications on file as of 07/11/2024.     Physical Exam: Today's Vitals   07/11/24 0927  TempSrc: Oral  Weight: 145 lb (65.8 kg)   Body mass index is 23.4 kg/m.  Physical Exam GEN: No acute distress. CV: Regular rate and rhythm, no murmurs. LUNGS: Clear to auscultation bilaterally, normal respiratory  effort. SKIN JOINTS: Warm and dry, no rash.    Data Reviewed: Imaging: Cardiac CT 01/06/2023- Mild subpleural reticulation, ground glass interstitial lung disease High-res CT 06/13/2024- Pulmonary parenchymal interstitial lung disease minimally progressive from July 2024.  Probable UIP pattern.  LAD coronary artery calcification.  Gastric wall thickening.  I have reviewed the images personally.  PFTs:  Labs:  Assessment & Plan Interstitial lung disease Mild interstitial  lung disease identified on CT scan, initially noted in July 2024 during a cardiac CT and confirmed in December 2024. No significant change in severity. Differential diagnosis includes exposure-related causes, autoimmune diseases, or idiopathic pulmonary fibrosis (IPF). No current symptoms such as cough, wheezing, or significant shortness of breath. Lungs are clear on auscultation. The condition is very mild and may progress slowly over time. Approximately 10% of IPF cases can be hereditary. - Ordered blood tests to evaluate for autoimmune diseases and exposures. - Scheduled follow-up appointment in 2-3 months for lung function tests. - Encouraged aerobic exercise to maintain lung health. - Will discuss potential antifibrotic medications if autoimmune causes are ruled out and condition progresses.  Recommendations: CTD serologies PFTs  Lonna Coder MD [facility] 07/11/2024, 9:28 AM  CC: Jarold Medici, MD   "

## 2024-07-13 LAB — ANA,IFA RA DIAG PNL W/RFLX TIT/PATN
Anti Nuclear Antibody (ANA): NEGATIVE
Cyclic Citrullin Peptide Ab: <16 U
Rheumatoid fact SerPl-aCnc: <10 [IU]/mL

## 2024-07-13 LAB — SJOGRENS SYNDROME-A EXTRACTABLE NUCLEAR ANTIBODY: SSA (Ro) (ENA) Antibody, IgG: <1 AI

## 2024-07-13 LAB — SJOGRENS SYNDROME-B EXTRACTABLE NUCLEAR ANTIBODY: SSB (La) (ENA) Antibody, IgG: <1 AI

## 2024-07-13 LAB — ANTI-SCLERODERMA ANTIBODY: Scleroderma (Scl-70) (ENA) Antibody, IgG: <1 AI

## 2024-07-14 LAB — HYPERSENSITIVITY PNEUMONITIS
A. Pullulans Abs: NEGATIVE
A.Fumigatus #1 Abs: NEGATIVE
Micropolyspora faeni, IgG: NEGATIVE
Pigeon Serum Abs: NEGATIVE
Thermoact. Saccharii: NEGATIVE
Thermoactinomyces vulgaris, IgG: NEGATIVE

## 2024-07-21 LAB — MYOMARKER 3 PLUS PROFILE (RDL)
Anti-EJ Ab (RDL): NEGATIVE
Anti-Jo-1 Ab (RDL): <20 U
Anti-Ku Ab (RDL): NEGATIVE
Anti-MDA-5 Ab (CADM-140)(RDL): <20 U
Anti-Mi-2 Ab (RDL): NEGATIVE
Anti-NXP-2 (P140) Ab (RDL): <20 U
Anti-OJ Ab (RDL): NEGATIVE
Anti-PL-12 Ab (RDL: NEGATIVE
Anti-PL-7 Ab (RDL): NEGATIVE
Anti-PM/Scl-100 Ab (RDL): <20 U
Anti-SAE1 Ab, IgG (RDL): <20 U
Anti-SRP Ab (RDL): NEGATIVE
Anti-SS-A 52kD Ab, IgG (RDL): <20 U
Anti-TIF-1gamma Ab (RDL): <20 U
Anti-U1 RNP Ab (RDL): <20 U
Anti-U2 RNP Ab (RDL): NEGATIVE
Anti-U3 RNP (Fibrillarin)(RDL): NEGATIVE

## 2024-08-22 ENCOUNTER — Ambulatory Visit: Admitting: Cardiology

## 2024-09-06 ENCOUNTER — Ambulatory Visit: Payer: Medicare HMO

## 2024-10-11 ENCOUNTER — Encounter

## 2024-10-11 ENCOUNTER — Ambulatory Visit: Admitting: Pulmonary Disease

## 2024-11-30 ENCOUNTER — Encounter: Payer: Self-pay | Admitting: Internal Medicine
